# Patient Record
Sex: Female | Born: 1945
Health system: Southern US, Community
[De-identification: ages and names within clinical notes are randomized; demographics above are authoritative.]

## PROBLEM LIST (undated history)

## (undated) DIAGNOSIS — M1990 Unspecified osteoarthritis, unspecified site: Secondary | ICD-10-CM

## (undated) DIAGNOSIS — M5136 Other intervertebral disc degeneration, lumbar region: Secondary | ICD-10-CM

## (undated) DIAGNOSIS — J449 Chronic obstructive pulmonary disease, unspecified: Secondary | ICD-10-CM

## (undated) DIAGNOSIS — R0600 Dyspnea, unspecified: Secondary | ICD-10-CM

## (undated) DIAGNOSIS — M51369 Other intervertebral disc degeneration, lumbar region without mention of lumbar back pain or lower extremity pain: Secondary | ICD-10-CM

## (undated) DIAGNOSIS — J189 Pneumonia, unspecified organism: Secondary | ICD-10-CM

## (undated) DIAGNOSIS — E7800 Pure hypercholesterolemia, unspecified: Secondary | ICD-10-CM

## (undated) DIAGNOSIS — Z9981 Dependence on supplemental oxygen: Secondary | ICD-10-CM

## (undated) DIAGNOSIS — R002 Palpitations: Secondary | ICD-10-CM

## (undated) DIAGNOSIS — Z8709 Personal history of other diseases of the respiratory system: Secondary | ICD-10-CM

## (undated) HISTORY — DX: Palpitations: R00.2

## (undated) HISTORY — DX: Dyspnea, unspecified: R06.00

## (undated) HISTORY — DX: Chronic obstructive pulmonary disease, unspecified: J44.9

## (undated) HISTORY — PX: DILATION AND CURETTAGE OF UTERUS: SHX78

## (undated) HISTORY — PX: CARPAL TUNNEL RELEASE: SHX101

## (undated) HISTORY — DX: Pure hypercholesterolemia, unspecified: E78.00

## (undated) MED FILL — Dexamethasone Sodium Phosphate Inj 100 MG/10ML: INTRAMUSCULAR | Qty: 1 | Status: AC

---

## 1999-01-22 ENCOUNTER — Other Ambulatory Visit: Admission: RE | Admit: 1999-01-22 | Discharge: 1999-01-22 | Payer: Self-pay | Admitting: Obstetrics and Gynecology

## 1999-02-11 ENCOUNTER — Encounter: Payer: Self-pay | Admitting: Obstetrics and Gynecology

## 1999-02-11 ENCOUNTER — Encounter: Admission: RE | Admit: 1999-02-11 | Discharge: 1999-02-11 | Payer: Self-pay | Admitting: Obstetrics and Gynecology

## 2000-01-26 ENCOUNTER — Other Ambulatory Visit: Admission: RE | Admit: 2000-01-26 | Discharge: 2000-01-26 | Payer: Self-pay | Admitting: Obstetrics and Gynecology

## 2001-01-10 ENCOUNTER — Other Ambulatory Visit: Admission: RE | Admit: 2001-01-10 | Discharge: 2001-01-10 | Payer: Self-pay | Admitting: Obstetrics and Gynecology

## 2002-01-18 ENCOUNTER — Other Ambulatory Visit: Admission: RE | Admit: 2002-01-18 | Discharge: 2002-01-18 | Payer: Self-pay | Admitting: Gynecology

## 2002-03-01 ENCOUNTER — Ambulatory Visit (HOSPITAL_BASED_OUTPATIENT_CLINIC_OR_DEPARTMENT_OTHER): Admission: RE | Admit: 2002-03-01 | Discharge: 2002-03-01 | Payer: Self-pay | Admitting: Gynecology

## 2002-03-01 ENCOUNTER — Encounter (INDEPENDENT_AMBULATORY_CARE_PROVIDER_SITE_OTHER): Payer: Self-pay | Admitting: Specialist

## 2002-10-11 ENCOUNTER — Emergency Department (HOSPITAL_COMMUNITY): Admission: EM | Admit: 2002-10-11 | Discharge: 2002-10-12 | Payer: Self-pay | Admitting: Emergency Medicine

## 2003-01-31 ENCOUNTER — Other Ambulatory Visit: Admission: RE | Admit: 2003-01-31 | Discharge: 2003-01-31 | Payer: Self-pay | Admitting: Gynecology

## 2003-07-19 ENCOUNTER — Encounter: Admission: RE | Admit: 2003-07-19 | Discharge: 2003-07-19 | Payer: Self-pay | Admitting: Family Medicine

## 2003-07-25 ENCOUNTER — Encounter: Admission: RE | Admit: 2003-07-25 | Discharge: 2003-07-25 | Payer: Self-pay | Admitting: Family Medicine

## 2004-02-05 ENCOUNTER — Other Ambulatory Visit: Admission: RE | Admit: 2004-02-05 | Discharge: 2004-02-05 | Payer: Self-pay | Admitting: Gynecology

## 2005-02-09 ENCOUNTER — Other Ambulatory Visit: Admission: RE | Admit: 2005-02-09 | Discharge: 2005-02-09 | Payer: Self-pay | Admitting: Gynecology

## 2005-04-08 ENCOUNTER — Encounter: Admission: RE | Admit: 2005-04-08 | Discharge: 2005-04-08 | Payer: Self-pay | Admitting: Family Medicine

## 2005-07-27 ENCOUNTER — Other Ambulatory Visit: Admission: RE | Admit: 2005-07-27 | Discharge: 2005-07-27 | Payer: Self-pay | Admitting: Gynecology

## 2005-11-16 HISTORY — PX: CARDIOVASCULAR STRESS TEST: SHX262

## 2005-11-19 ENCOUNTER — Encounter: Payer: Self-pay | Admitting: Internal Medicine

## 2005-11-19 HISTORY — PX: US ECHOCARDIOGRAPHY: HXRAD669

## 2006-02-08 ENCOUNTER — Other Ambulatory Visit: Admission: RE | Admit: 2006-02-08 | Discharge: 2006-02-08 | Payer: Self-pay | Admitting: Gynecology

## 2006-07-05 ENCOUNTER — Other Ambulatory Visit: Admission: RE | Admit: 2006-07-05 | Discharge: 2006-07-05 | Payer: Self-pay | Admitting: Gynecology

## 2007-05-20 ENCOUNTER — Encounter: Admission: RE | Admit: 2007-05-20 | Discharge: 2007-05-20 | Payer: Self-pay | Admitting: Family Medicine

## 2009-04-18 ENCOUNTER — Encounter: Admission: RE | Admit: 2009-04-18 | Discharge: 2009-04-18 | Payer: Self-pay | Admitting: Gynecology

## 2009-07-17 ENCOUNTER — Encounter: Admission: RE | Admit: 2009-07-17 | Discharge: 2009-07-17 | Payer: Self-pay | Admitting: Gynecology

## 2009-11-27 ENCOUNTER — Ambulatory Visit: Payer: Self-pay | Admitting: Cardiovascular Disease

## 2009-11-29 ENCOUNTER — Ambulatory Visit (HOSPITAL_COMMUNITY): Admission: RE | Admit: 2009-11-29 | Discharge: 2009-11-29 | Payer: Self-pay | Admitting: Cardiovascular Disease

## 2009-11-29 ENCOUNTER — Encounter: Payer: Self-pay | Admitting: Internal Medicine

## 2009-12-13 ENCOUNTER — Ambulatory Visit: Payer: Self-pay | Admitting: Internal Medicine

## 2009-12-13 ENCOUNTER — Telehealth (INDEPENDENT_AMBULATORY_CARE_PROVIDER_SITE_OTHER): Payer: Self-pay | Admitting: *Deleted

## 2009-12-13 DIAGNOSIS — E7800 Pure hypercholesterolemia, unspecified: Secondary | ICD-10-CM | POA: Insufficient documentation

## 2009-12-13 DIAGNOSIS — R002 Palpitations: Secondary | ICD-10-CM | POA: Insufficient documentation

## 2009-12-13 DIAGNOSIS — J449 Chronic obstructive pulmonary disease, unspecified: Secondary | ICD-10-CM | POA: Insufficient documentation

## 2009-12-13 DIAGNOSIS — R0602 Shortness of breath: Secondary | ICD-10-CM | POA: Insufficient documentation

## 2010-01-08 ENCOUNTER — Telehealth (INDEPENDENT_AMBULATORY_CARE_PROVIDER_SITE_OTHER): Payer: Self-pay | Admitting: *Deleted

## 2010-02-11 NOTE — Assessment & Plan Note (Signed)
Summary: Pulmonary/ new pt eval for GOLD IV COPD    Visit Type:  Initial Consult Copy to:  Dr. Elease Hashimoto Primary Provider/Referring Provider:  Dr. Shaune Pollack  CC:  Dyspnea- Abnormal PFT's.  .  History of Present Illness: 65 yowf quit smoking 2007 tendency to bronchitis which resolved p quit and nl activity tolerance  with weight gain from 117 to  147 since and noted onset with diffulty with steps at our house proportionaely.  December 13, 2009  1st pulmonary office eval for doe x steps/ inclines,  no assoc cough, no nocturnal complaints. can shag dance fine.  Pt denies any significant sore throat, dysphagia, itching, sneezing,  nasal congestion or excess secretions,  fever, chills, sweats, unintended wt loss, pleuritic or exertional cp, hempoptysis, change in activity tolerance  orthopnea pnd or leg swelling Pt denies any significant sore throat, dysphagia, itching, sneezing,  nasal congestion or excess secretions,  fever, chills, sweats, unintended wt loss, pleuritic or exertional cp, hempoptysis, change in activity tolerance  orthopnea pnd or leg swelling   Current Medications (verified): 1)  Propranolol Hcl 80 Mg Tabs (Propranolol Hcl) .... 1/2 Once Daily 2)  Niaspan 500 Mg Cr-Tabs (Niacin (Antihyperlipidemic)) .Marland Kitchen.. 1 Once Daily 3)  Valacyclovir Hcl 1 Gm Tabs (Valacyclovir Hcl) .... 1/2 Once Daily 4)  Prometrium 200 Mg Caps (Progesterone Micronized) .... As Directed 5)  Vivelle-Dot 0.05 Mg/24hr Pttw (Estradiol) .... 1/2 Every Wk 6)  Aspirin 81 Mg Tbec (Aspirin) .Marland Kitchen.. 1 Once Daily 7)  Vitamin D3 1000 Unit Tabs (Cholecalciferol) .... 2 Two Times A Day 8)  Ocuvite  Tabs (Multiple Vitamins-Minerals) .Marland Kitchen.. 1 Once Daily 9)  Fish Oil 1000 Mg Caps (Omega-3 Fatty Acids) .Marland Kitchen.. 1 Two Times A Day 10)  Citracal Plus  Tabs (Multiple Minerals-Vitamins) .... 2 Two Times A Day 11)  Minocycline Hcl 50 Mg Caps (Minocycline Hcl) .Marland Kitchen.. 1 Once Daily Until Gone  Allergies (verified): 1)  ! Codeine  Past  History:  Past Medical History:  HYPERCHOLESTEROLEMIA (ICD-272.0) DYSPNEA (ICD-786.05) COPD     - HC03 level 43 05/2009      - PFT's 11/29/09 FEV1 .77 (29%) with ratio 46   and 12% p B2 and DLCO 57  Family History: Breast CA- Mother Emphysema- MGF  Social History: Widowed Children Former smoker.  Quit in May 2007.  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  The patient denies shortness of breath at rest, productive cough, non-productive cough, coughing up blood, chest pain, irregular heartbeats, acid heartburn, indigestion, loss of appetite, weight change, abdominal pain, difficulty swallowing, sore throat, tooth/dental problems, headaches, nasal congestion/difficulty breathing through nose, sneezing, itching, ear ache, anxiety, depression, hand/feet swelling, joint stiffness or pain, rash, change in color of mucus, and fever.    Vital Signs:  Patient profile:   65 year old female Height:      66 inches Weight:      147.25 pounds BMI:     23.85 O2 Sat:      93 % on Room air Temp:     97.5 degrees F oral Pulse rate:   63 / minute BP sitting:   114 / 74  (left arm)  Vitals Entered By: Vernie Murders (December 13, 2009 11:09 AM)  O2 Flow:  Room air  Physical Exam  Additional Exam:  wt 147 December 13, 2009  HEENT mild turbinate edema.  Oropharynx no thrush or excess pnd or cobblestoning.  No JVD or cervical adenopathy. Mild accessory muscle hypertrophy. Trachea midline, nl thryroid. Chest was hyperinflated by percussion with diminished breath sounds and moderate increased exp time without wheeze. Hoover sign positive at mid inspiration. Regular rate and rhythm without murmur gallop or rub or increase P2 or edema.  Abd: no hsm, nl excursion. Ext warm without cyanosis or clubbing.     Impression & Recommendations:  Problem # 1:  COPD UNSPECIFIED (ICD-496)  Much more  severe than symptoms suggest with sign reversible component, no cough or significant clinical variability    DDX of  difficult airways managment all start with A and  include Adherence, Ace Inhibitors, Acid Reflux, Active Sinus Disease, Alpha 1 Antitripsin deficiency, Anxiety masquerading as Airways dz,  ABPA,  allergy(esp in young), Aspiration (esp in elderly), Adverse effects of DPI,  Active smokers, plus one B  = Beta blocker use..  and one C = CHF  Beta blockers problematic here, see #2.   Would prefer to avoid rx for copd  for now until we see the full effect of change to specific Beta Blocker if tolerated and get a firm baseline.  Based on her hx, she would be a good candidate for spiriva and this actually can help even in presence of ongoing BBlockade affecting B2 receptors  Problem # 2:  PALPITATIONS (ICD-785.1)  Her updated medication list for this problem includes:    Propranolol Hcl 80 Mg Tabs (Propranolol hcl) .Marland Kitchen... 1/2 once daily    Bisoprolol Fumarate 5 Mg Tabs (Bisoprolol fumarate) ..... One each every evening  Medications Added to Medication List This Visit: 1)  Propranolol Hcl 80 Mg Tabs (Propranolol hcl) .... 1/2 once daily 2)  Niaspan 500 Mg Cr-tabs (Niacin (antihyperlipidemic)) .Marland Kitchen.. 1 once daily 3)  Valacyclovir Hcl 1 Gm Tabs (Valacyclovir hcl) .... 1/2 once daily 4)  Prometrium 200 Mg Caps (Progesterone micronized) .... As directed 5)  Vivelle-dot 0.05 Mg/24hr Pttw (Estradiol) .... 1/2 every wk 6)  Aspirin 81 Mg Tbec (Aspirin) .Marland Kitchen.. 1 once daily 7)  Vitamin D3 1000 Unit Tabs (Cholecalciferol) .... 2 two times a day 8)  Ocuvite Tabs (Multiple vitamins-minerals) .Marland Kitchen.. 1 once daily 9)  Fish Oil 1000 Mg Caps (Omega-3 fatty acids) .Marland Kitchen.. 1 two times a day 10)  Citracal Plus Tabs (Multiple minerals-vitamins) .... 2 two times a day 11)  Minocycline Hcl 50 Mg Caps (Minocycline hcl) .Marland Kitchen.. 1 once daily until gone 12)  Bisoprolol Fumarate 5 Mg Tabs (Bisoprolol fumarate) .... One each  every evening  Other Orders: New Patient Level V (04540) T-2 View CXR (71020TC)  Patient Instructions: 1)  Bisoprolol 5 mg one at bedtime 2)  Propranolol 80 mg one quarter in am for one week then try to stop it. 3)  Return to office in 3 months, sooner if needed   Appended Document: Pulmonary/ new pt eval for GOLD IV COPD  cxr  12/13/09:  Findings: Hyperexpansion is consistent with emphysema. The cardiopericardial silhouette is within normal limits for size.  No focal consolidation, pulmonary edema, or pleural effusion. Imaged bony structures of the thorax are intact.   IMPRESSION: No acute cardiopulmonary process.

## 2010-02-11 NOTE — Progress Notes (Signed)
Summary: needs bisoprolol rx called in asap today  Phone Note Call from Patient   Caller: Patient Call For: Joanne Rogers Summary of Call: pt was seen today. wants her rx' called in asap today to [facility] on [address] # [phone]. pt needed to take this tonight and is going out of town shortly. cell [phone] Initial call taken by: Tivis Ringer, CNA,  December 13, 2009 4:44 PM  Follow-up for Phone Call        Pt states MW started her on bisoprolol today at [facility] but rx was never sent in.  Rx was sent -- pt aware.  Follow-up by: Gweneth Dimitri RN,  December 13, 2009 5:00 PM    Prescriptions: BISOPROLOL FUMARATE 5 MG TABS (BISOPROLOL FUMARATE) one each every evening  #30 x 0   Entered by:   Gweneth Dimitri RN   Authorized by:   Nyoka Cowden MD   Signed by:   Gweneth Dimitri RN on 12/13/2009   Method used:   Electronically to        [facility] [address] (retail)       [phone]       [place], [place]  [address]       Ph: [phone]       Fax: [phone]   RxID:   [id number]

## 2010-02-13 NOTE — Progress Notes (Signed)
Summary: brioprolol   Phone Note Call from Patient Call back at Home Phone [phone]   Caller: Patient Call For: wert Reason for Call: Refill Medication Summary of Call: Patient calling wanting to know if she needs to continue  Bisoprolol 5 mg, if so she needs a refill.  [facility] [address] Initial call taken by: Lehman Prom,  January 08, 2010 3:00 PM  Follow-up for Phone Call        Spoke with pt and notified that rx was sent to pharm. Follow-up by: Vernie Murders,  January 08, 2010 3:08 PM    Prescriptions: BISOPROLOL FUMARATE 5 MG TABS (BISOPROLOL FUMARATE) one each every evening  #30 x 2   Entered by:   Vernie Murders   Authorized by:   Nyoka Cowden MD   Signed by:   Vernie Murders on 01/08/2010   Method used:   Electronically to        [facility] [address] (retail)       [phone]       [place], [place]  [address]       Ph: [phone]       Fax: [phone]   RxID:   [id number]

## 2010-03-31 ENCOUNTER — Encounter: Payer: Self-pay | Admitting: Internal Medicine

## 2010-03-31 ENCOUNTER — Ambulatory Visit (INDEPENDENT_AMBULATORY_CARE_PROVIDER_SITE_OTHER): Payer: Medicare Other | Admitting: Internal Medicine

## 2010-03-31 DIAGNOSIS — J449 Chronic obstructive pulmonary disease, unspecified: Secondary | ICD-10-CM

## 2010-03-31 DIAGNOSIS — R002 Palpitations: Secondary | ICD-10-CM

## 2010-04-10 NOTE — Assessment & Plan Note (Signed)
Summary: Pulmonary/ ext ov wih dpi 90%   Visit Type:  Follow-up Copy to:  Dr. Elease Hashimoto Primary Provider/Referring Provider:  Dr. Shaune Pollack  CC:  Patient is here for COPD follow-up...still having SOB w/ activity...no worse.  History of Present Illness: 70 yowf quit smoking 2007 tendency to bronchitis which resolved p quit and nl activity tolerance  with weight gain from 117 to  147 since and noted onset with difficulty  with steps at  house proportionaely.  December 13, 2009  1st pulmonary office eval for 3 years doe x steps/ inclines,  no assoc cough, no nocturnal complaints. can shag dance fine. PFT's 11/11 c/w Stage IV rec try off propranolol.  rec Bisoprolol 5 mg one at bedtime Propranolol 80 mg one quarter in am for one week then try to stop it.  March 31, 2010 ov Patient is here for COPD follow-up...still having SOB w/ activity...no worse. having active itching sneezing runny nose but no effect on breathing.  Pt denies any significant sore throat, dysphagia,  fever, chills, sweats, unintended wt loss, pleuritic or exertional cp, hempoptysis, change in activity tolerance  orthopnea pnd or leg swelling.  Pt also denies any obvious fluctuation in symptoms with weather or environmental change or other alleviating or aggravating factors.       Current Medications (verified): 1)  Niaspan 500 Mg Cr-Tabs (Niacin (Antihyperlipidemic)) .Marland Kitchen.. 1 Once Daily 2)  Valacyclovir Hcl 1 Gm Tabs (Valacyclovir Hcl) .... 1/2 Once Daily 3)  Prometrium 200 Mg Caps (Progesterone Micronized) .... As Directed 4)  Vivelle-Dot 0.05 Mg/24hr Pttw (Estradiol) .... 1/2 Every Wk 5)  Aspirin 81 Mg Tbec (Aspirin) .Marland Kitchen.. 1 Once Daily 6)  Vitamin D3 1000 Unit Tabs (Cholecalciferol) .... 2 Two Times A Day 7)  Ocuvite  Tabs (Multiple Vitamins-Minerals) .Marland Kitchen.. 1 Once Daily 8)  Fish Oil 1000 Mg Caps (Omega-3 Fatty Acids) .Marland Kitchen.. 1 Two Times A Day 9)  Citracal Plus  Tabs (Multiple Minerals-Vitamins) .... 2 Two Times A Day 10)   Minocycline Hcl 50 Mg Caps (Minocycline Hcl) .Marland Kitchen.. 1 Once Daily Until Gone 11)  Bisoprolol Fumarate 5 Mg Tabs (Bisoprolol Fumarate) .... One Each Every Evening  Allergies (verified): 1)  ! Codeine  Past History:  Past Medical History: HYPERCHOLESTEROLEMIA (ICD-272.0) DYSPNEA (ICD-786.05) COPD     - HC03 level 43 05/2009      - PFT's 11/29/09 FEV1 .77 (29%) with ratio 46   and 12% p B2 and DLCO 57     - Spriva trial March 31, 2010 >>>  Vital Signs:  Patient profile:   65 year old female Height:      66 inches (167.64 cm) Weight:      150.50 pounds (68.41 kg) BMI:     24.38 O2 Sat:      92 % on Room air Temp:     97.5 degrees F (36.39 degrees C) oral Pulse rate:   58 / minute BP sitting:   118 / 76  (left arm) Cuff size:   regular  Vitals Entered By: Michel Bickers CMA (March 31, 2010 11:42 AM)  O2 Sat at Rest %:  92 O2 Flow:  Room air CC: Patient is here for COPD follow-up...still having SOB w/ activity...no worse Comments Medications reviewed with patient Michel Bickers [facility]  March 31, 2010 11:43 AM   Physical Exam  Additional Exam:  wt 147 December 13, 2009 > 150 March 31, 2010  HEENT mild turbinate edema.  Oropharynx no thrush or excess pnd or  cobblestoning.  No JVD or cervical adenopathy. Mild accessory muscle hypertrophy. Trachea midline, nl thryroid. Chest was hyperinflated by percussion with diminished breath sounds and moderate increased exp time without wheeze. Hoover sign positive at mid inspiration. Regular rate and rhythm without murmur gallop or rub or increase P2 or edema.  Abd: no hsm, nl excursion. Ext warm without cyanosis or clubbing.     Impression & Recommendations:  Problem # 1:  COPD UNSPECIFIED (ICD-496)  Much more severe than symptoms suggest with sign reversible component, no cough or significant clinical variability    DDX of  difficult airways managment all start with A and  include Adherence, Ace Inhibitors, Acid Reflux, Active Sinus Disease, Alpha  1 Antitripsin deficiency, Anxiety masquerading as Airways dz,  ABPA,  allergy(esp in young), Aspiration (esp in elderly), Adverse effects of DPI,  Active smokers, plus one B  = Beta blocker use..  and one C = CHF  Beta blockers problematic here, see #2.   Try spiriva next I spent extra time with the patient today explaining optimal dpi  technique.  This improved from  75-90%  Problem # 2:  PALPITATIONS (ICD-785.1)  The following medications were removed from the medication list:    Propranolol Hcl 80 Mg Tabs (Propranolol hcl) .Marland Kitchen... 1/2 once daily Her updated medication list for this problem includes:    Bisoprolol Fumarate 5 Mg Tabs (Bisoprolol fumarate) ..... One each am   ok to use as needed propranolol but based on airflow obstruction prefer as maint rx  Bystolic, the most beta -1  selective Beta blocker available in sample form, with bisoprolol the most selective generic choice  on the market.   Medications Added to Medication List This Visit: 1)  Bisoprolol Fumarate 5 Mg Tabs (Bisoprolol fumarate) .... One each am 2)  Spiriva Handihaler 18 Mcg Caps (Tiotropium bromide monohydrate) .... Two puffs in handihaler daily  Other Orders: Est. Patient Level IV (44034) Prescription Created Electronically [phone])  Patient Instructions: 1)  Spiriva one capsule each am 2)  I think of spiriva in this setting like purchasing high octane fuel for an older car with lots of miles on it. It may help the perfomance enough to warrant the purchase, but it won't change the longevity of the car or make it any easier parking it. It should improve peak performance if the patient is patient and lets the medicine work the way it's intended  - improving activity tolerance 3)  Return to office in 3 months, sooner if needed  Prescriptions: SPIRIVA HANDIHALER 18 MCG  CAPS (TIOTROPIUM BROMIDE MONOHYDRATE) Two puffs in handihaler daily  #30 x 11   Entered and Authorized by:   Nyoka Cowden MD   Signed by:    Nyoka Cowden MD on 03/31/2010   Method used:   Print then Give to Patient   RxID:   [id number] BISOPROLOL FUMARATE 5 MG TABS (BISOPROLOL FUMARATE) one each am  #30 x 11   Entered and Authorized by:   Nyoka Cowden MD   Signed by:   Nyoka Cowden MD on 03/31/2010   Method used:   Electronically to        [facility] (retail)       [address]       [place], [place]  [address]       Ph: [phone]       Fax: [phone]   RxID:   [id number]  Immunization History:  Influenza Immunization History:    Influenza:  historical (10/12/2009)

## 2010-04-21 ENCOUNTER — Other Ambulatory Visit: Payer: Self-pay | Admitting: Gynecology

## 2010-05-30 NOTE — Op Note (Signed)
   NAME:  Joanne Rogers, Joanne Rogers                          ACCOUNT NO.:  [id number]   MEDICAL RECORD NO.:  [id number]                   PATIENT TYPE:  AMB   LOCATION:  [facility]                                 FACILITY:  [facility]   PHYSICIAN:  Gretta Cool, M.D.              DATE OF BIRTH:  03/09/1945   DATE OF PROCEDURE:  03/01/2002  DATE OF DISCHARGE:                                 OPERATIVE REPORT   PREOPERATIVE DIAGNOSIS:  Endometrial polyp versus fibroid with abnormal  uterine bleeding.   POSTOPERATIVE DIAGNOSIS:  Luminal leiomyoma approximately 2-3 cm in diameter  with abnormal uterine bleeding secondary.   ANESTHESIA:  IV sedation and paracervical block.   SURGEON:  Gretta Cool, M.D.   DESCRIPTION OF PROCEDURE:  Under excellent anesthesia as above with the  patient prepped and draped in Allen stirrups, the cervix was grasped with a  single-tooth tenaculum and pulled down into view.  It was then progressively  dilated with a series of Pratt dilators to accommodate 7 mm resectoscope.  Photographs were taken of the luminal fibroid.  The fibroid was then  progressively resected intracapsular.  As it was resected, it progressively  bulged further out into the endometrial cavity.  It appeared to be at least  2-3 cm in diameter with a very large volume of fibroid resected.  The  resection remained intracapsular and there was no significant bleeding as  the entire fibroid was shelled out.  The endometrial cavity was then  progressively resected so as to remove all of the endometrial tissue.  There  were some areas of intrauterine synechia that were also excised.  Tissue  submitted to pathology.  With reduced pressure there was no significant  bleeding.  The entire cavity was treated with VaporTrode electrode at 200  watts per cut after resection of the endometrium.  At the end of the  procedure, there was approximately a 30 cubic centimeters deficit.  No  complications.  The patient  returned to the recovery room in excellent  condition.                                               Gretta Cool, M.D.    CWL/MEDQ  D:  03/01/2002  T:  03/01/2002  Job:  [id number]   cc:   Duncan Dull, M.D.  [address]  [place]  [place] [address]  Fax: [phone]

## 2010-06-24 ENCOUNTER — Other Ambulatory Visit: Payer: Self-pay | Admitting: Gynecology

## 2010-06-24 DIAGNOSIS — Z1231 Encounter for screening mammogram for malignant neoplasm of breast: Secondary | ICD-10-CM

## 2010-06-28 ENCOUNTER — Encounter: Payer: Self-pay | Admitting: Internal Medicine

## 2010-07-03 ENCOUNTER — Ambulatory Visit (INDEPENDENT_AMBULATORY_CARE_PROVIDER_SITE_OTHER): Payer: Medicare Other | Admitting: Internal Medicine

## 2010-07-03 VITALS — BP 110/58 | HR 68 | Temp 97.6°F | Ht 67.0 in | Wt 150.0 lb

## 2010-07-03 DIAGNOSIS — R002 Palpitations: Secondary | ICD-10-CM

## 2010-07-03 DIAGNOSIS — J449 Chronic obstructive pulmonary disease, unspecified: Secondary | ICD-10-CM

## 2010-07-03 MED ORDER — PNEUMOCOCCAL VAC POLYVALENT 25 MCG/0.5ML IJ INJ
0.5000 mL | INJECTION | Freq: Once | INTRAMUSCULAR | Status: AC
  Administered 2010-07-03: 0.5 mL via INTRAMUSCULAR

## 2010-07-03 NOTE — Patient Instructions (Addendum)
Pneumovax given today is the last one you'll need the CDC changes guidelines  Although you have significant COPD from smoking, now that you've successfully quit you may enjoy many years of stability in terms of your lung function  Continue spiriva indefinitely   If you are satisfied with your treatment plan let your doctor know and he/she can either refill your medications or you can return here when your prescription runs out.     If in any way you are not 100% satisfied,  please tell us.  If 100% better, tell your friends!

## 2010-07-03 NOTE — Progress Notes (Signed)
Subjective:     Patient ID: Joanne Rogers, female   DOB: 1945-04-18, 65 y.o.   MRN: [id number]  HPI  Primary Lupita Leash Gates/Lomax   65 yowf quit smoking 2007 tendency to bronchitis which resolved p quit and nl activity tolerance with weight gain from 117 to 147 since then  noted onset with difficulty with steps at house proportionate to wt gain.  December 13, 2009 1st pulmonary office eval for 3 years doe x steps/ inclines, no assoc cough, no nocturnal complaints. can shag dance fine. PFT's 11/11 c/w GOLD  IV rec try off propranolol. rec Bisoprolol 5 mg one at bedtime  Propranolol 80 mg one quarter in am for one week then try to stop it.   March 31, 2010 ov COPD follow-up...still having SOB w/ activity...no worse. having active itching sneezing runny nose but no effect on breathing.  rec trial of spiriva  07/03/2010 ov/Lashun Ramseyer f/u doe def better on spiriva, not limited unless gets in a hurry or up steps.    Sleeping ok without nocturnal  or early am exac of resp c/o's or need for noct saba. Pt denies any significant sore throat, dysphagia, itching, sneezing,  nasal congestion or excess/ purulent secretions,  fever, chills, sweats, unintended wt loss, pleuritic or exertional cp, hempoptysis, orthopnea pnd or leg swelling.    Also denies any obvious fluctuation of symptoms with weather or environmental changes or other aggravating or alleviating factors.      Past Medical History:  HYPERCHOLESTEROLEMIA (ICD-272.0)  DYSPNEA (ICD-786.05)  COPD  - HC03 level 43 05/2009  - PFT's 11/29/09 FEV1 .77 (29%) with ratio 46 and 12% p B2 and DLCO 57  - Spriva trial March 31, 2010 > improved ex tol               Review of Systems     Objective:   Physical Exam    Additional Exam: wt 147 December 13, 2009 > 150 March 31, 2010 > 150 07/03/10 HEENT mild turbinate edema. Oropharynx no thrush or excess pnd or cobblestoning. No JVD or cervical adenopathy. Mild accessory muscle hypertrophy. Trachea midline,  nl thryroid. Chest was hyperinflated by percussion with diminished breath sounds and moderate increased exp time without wheeze. Hoover sign positive at mid inspiration. Regular rate and rhythm without murmur gallop or rub or increase P2 or edema. Abd: no hsm, nl excursion. Ext warm without cyanosis or clubbing Assessment:          Plan:

## 2010-07-04 ENCOUNTER — Encounter: Payer: Self-pay | Admitting: Internal Medicine

## 2010-07-04 NOTE — Assessment & Plan Note (Signed)
Strongly prefer in this setting: Bystolic, the most beta -1  selective Beta blocker available in sample form, with bisoprolol the most selective generic choice  on the market.  

## 2010-07-04 NOTE — Assessment & Plan Note (Signed)
GOLD IV but remarkably well compensated with acceptable activity tolerance on spiriva.    Each maintenance medication was reviewed in detail including most importantly the difference between maintenance and as needed and under what circumstances the prns are to be used.  Please see instructions for details which were reviewed in writing and the patient given a copy.  See instructions for specific recommendations which were reviewed directly with the patient who was given a copy with highlighter outlining the key components.

## 2010-07-22 ENCOUNTER — Ambulatory Visit
Admission: RE | Admit: 2010-07-22 | Discharge: 2010-07-22 | Disposition: A | Discharge status: Home / Self Care | Payer: Medicare Other | Source: Ambulatory Visit | Attending: Gynecology | Admitting: Gynecology

## 2010-07-22 DIAGNOSIS — Z1231 Encounter for screening mammogram for malignant neoplasm of breast: Secondary | ICD-10-CM

## 2010-10-29 ENCOUNTER — Encounter: Payer: Self-pay | Admitting: Cardiovascular Disease

## 2010-11-19 ENCOUNTER — Encounter: Payer: Self-pay | Admitting: Cardiovascular Disease

## 2010-11-21 ENCOUNTER — Ambulatory Visit (INDEPENDENT_AMBULATORY_CARE_PROVIDER_SITE_OTHER): Payer: Medicare Other | Admitting: Cardiovascular Disease

## 2010-11-21 ENCOUNTER — Encounter: Payer: Self-pay | Admitting: Cardiovascular Disease

## 2010-11-21 VITALS — BP 106/63 | HR 62 | Ht 67.0 in | Wt 151.1 lb

## 2010-11-21 DIAGNOSIS — R002 Palpitations: Secondary | ICD-10-CM

## 2010-11-21 NOTE — Assessment & Plan Note (Signed)
She is doing well on the Bisoprolol. Continue.  She may take PRN pranolol.  She will get further refills from her primary medical doctor.

## 2010-11-21 NOTE — Progress Notes (Signed)
Joanne Rogers Date of Birth  November 10, 1945 [facility] [address]    [address] [place], [place]  [address] [phone]  Fax  [phone]  History of Present Illness:  Joanne Rogers is a 65 y.o. female with a hx of dyspnea.  She had a normal myoview in 2007.  She has a hx of hyperliidemia.   She had recent lipids but they are not available at this time.  She has been diagnosed with COPD.  She denies any chest pain.  Current Outpatient Prescriptions on File Prior to Visit  Medication Sig Dispense Refill  . aspirin 81 MG tablet Take 81 mg by mouth daily.        . bisoprolol (ZEBETA) 5 MG tablet Take 5 mg by mouth daily.        . Cholecalciferol (VITAMIN D) 1000 UNITS capsule Take 1,000 Units by mouth 2 (two) times daily.        . Estradiol (ELESTRIN) 0.52 MG/0.87 GM (0.06%) GEL Place 1 application onto the skin daily.        . Multiple Minerals-Vitamins (CITRACAL PLUS) TABS Take 2 tablets by mouth 2 (two) times daily.        . Multiple Vitamins-Minerals (OCUVITE PO) Take 1 capsule by mouth daily.        . niacin (NIASPAN) 500 MG CR tablet Take 1,000 mg by mouth at bedtime.       . Omega-3 Fatty Acids (FISH OIL) 1000 MG CAPS Take 2 capsules by mouth 2 (two) times daily.       . progesterone (PROMETRIUM) 200 MG capsule Take 200 mg by mouth as directed.        . tiotropium (SPIRIVA) 18 MCG inhalation capsule Place 18 mcg into inhaler and inhale daily.        . valACYclovir (VALTREX) 1000 MG tablet 1/2 daily        Allergies  Allergen Reactions  . Codeine     REACTION: hives  . Crestor (Rosuvastatin Calcium)   . Lovastatin   . Simvastatin     Past Medical History  Diagnosis Date  . Hypercholesterolemia   . Dyspnea   . COPD (chronic obstructive pulmonary disease)   . Palpitations   . Chest pain     Past Surgical History  Procedure Date  . Cardiovascular stress test 11/16/2005    EF 70%, NO ISCHEMIA  . US echocardiography 11/19/2005    EF 55-60%    History  Smoking status    . Former Smoker -- 0.5 packs/day for 25 years  . Quit date: 05/16/2005  Smokeless tobacco  . Not on file    History  Alcohol Use  . Yes    social    Family History  Problem Relation Age of Onset  . Breast cancer Mother   . Emphysema Maternal Grandfather     Reviw of Systems:  Reviewed in the HPI.  All other systems are negative.  Physical Exam: BP 106/63  Pulse 62  Ht 5\' 7"  (1.702 m)  Wt 151 lb 1.9 oz (68.548 kg)  BMI 23.67 kg/m2 The patient is alert and oriented x 3.  The mood and affect are normal.   Skin: warm and dry.  Color is normal.    HEENT:   Normal carotids, no JVD, mucus membranes are normal.  Lungs: clear to ausc.   Heart: RR    Abdomen: +BS, no HSM, non tender  Extremities:  On palpable cords, no c/c/e  Neuro:  Non focal,  gait is normal    ECG: NSR, normal ECG  Assessment / Plan:

## 2010-11-21 NOTE — Patient Instructions (Signed)
Your physician recommends that you schedule a follow-up appointment in: AS NEEDED BASIS  

## 2010-11-21 NOTE — Assessment & Plan Note (Signed)
Joanne Rogers does not have any significant cardiac problems. She has a history history of dyspnea which he now knows due to her COPD. We'll have her followup with Dr. Sherene Sires and I will see her as needed.

## 2010-12-03 ENCOUNTER — Telehealth: Payer: Self-pay | Admitting: Internal Medicine

## 2010-12-03 ENCOUNTER — Telehealth: Payer: Self-pay | Admitting: Cardiovascular Disease

## 2010-12-03 NOTE — Telephone Encounter (Signed)
Samples of Spiriva placed up front for pt to pick up. Pt told that the office will be closing early and she will need to come by 1pm if needing the samples today. [place] first of the year.

## 2010-12-03 NOTE — Telephone Encounter (Signed)
New message:  Would you ask Dr. Elease Hashimoto about her medication. Her muscles in arms are extremely sore.  Can this be coming from the medication she is taking?  Please call and advise.  Can she take Co-q-10?

## 2010-12-03 NOTE — Telephone Encounter (Signed)
Stop niaspan for one week to see if arm pain gets better. If not, she is to see pcp for recommendation. She is to call back in one week to update [place]. Pt verbalized understanding. Alfonso Ramus RN

## 2010-12-10 ENCOUNTER — Telehealth: Payer: Self-pay | Admitting: *Deleted

## 2010-12-10 NOTE — Telephone Encounter (Signed)
Left msg to call back with update of muscle ache and stopping of niaspan.

## 2010-12-11 NOTE — Telephone Encounter (Signed)
Fu call Pt returning your call from yesterday

## 2010-12-11 NOTE — Telephone Encounter (Signed)
Arm pains continue despite stopping niaspan. Pt will re start med. Pt to contact pcp for further work up with muscle ache in bilateral arms.

## 2011-01-14 ENCOUNTER — Other Ambulatory Visit: Payer: Self-pay | Admitting: Cardiovascular Disease

## 2011-01-14 NOTE — Telephone Encounter (Signed)
Fax Received. Refill Completed. Kya Mayfield Chowoe (R.M.A)   

## 2011-01-21 ENCOUNTER — Other Ambulatory Visit: Payer: Self-pay | Admitting: Dermatology

## 2011-04-06 ENCOUNTER — Other Ambulatory Visit: Payer: Self-pay | Admitting: Internal Medicine

## 2011-04-09 ENCOUNTER — Telehealth: Payer: Self-pay | Admitting: Cardiovascular Disease

## 2011-04-09 MED ORDER — BISOPROLOL FUMARATE 5 MG PO TABS: 5.0000 mg | ORAL_TABLET | Freq: Every day | ORAL | Status: DC

## 2011-04-09 NOTE — Telephone Encounter (Signed)
Pt uses [facility]/not [facility], script refilled

## 2011-04-09 NOTE — Telephone Encounter (Signed)
Pt calling re refilling Bisotrolol 5 mg #30 for her, dr wert prescribed it but she requested it three days ago, but he has not called it in yet and she's going out of town, wants to know if Dr Elease Hashimoto would refill it? uses  [facility],. Pt [phone]

## 2011-04-13 ENCOUNTER — Other Ambulatory Visit: Payer: Self-pay | Admitting: *Deleted

## 2011-04-13 NOTE — Telephone Encounter (Signed)
Opened in Error.

## 2011-04-23 ENCOUNTER — Other Ambulatory Visit: Payer: Self-pay | Admitting: Gynecology

## 2011-04-28 ENCOUNTER — Other Ambulatory Visit: Payer: Self-pay | Admitting: Internal Medicine

## 2011-05-28 ENCOUNTER — Encounter: Payer: Self-pay | Admitting: Cardiovascular Disease

## 2011-07-01 ENCOUNTER — Other Ambulatory Visit: Payer: Self-pay | Admitting: Gynecology

## 2011-07-01 DIAGNOSIS — Z803 Family history of malignant neoplasm of breast: Secondary | ICD-10-CM

## 2011-07-01 DIAGNOSIS — Z1231 Encounter for screening mammogram for malignant neoplasm of breast: Secondary | ICD-10-CM

## 2011-07-23 ENCOUNTER — Ambulatory Visit: Payer: Medicare Other

## 2011-07-31 ENCOUNTER — Telehealth: Payer: Self-pay | Admitting: Internal Medicine

## 2011-07-31 NOTE — Telephone Encounter (Signed)
Spoke with Marcelino Duster and advised we can not give samples b/c we only see the pt prn now and she has not been seen in over 1 yr. Advised her to check with pt's PCP and see if they can help. She verbalized understanding and states nothing further needed.

## 2011-08-06 ENCOUNTER — Ambulatory Visit
Admission: RE | Admit: 2011-08-06 | Discharge: 2011-08-06 | Disposition: A | Discharge status: Home / Self Care | Payer: Medicare Other | Source: Ambulatory Visit | Attending: Gynecology | Admitting: Gynecology

## 2011-08-06 DIAGNOSIS — Z803 Family history of malignant neoplasm of breast: Secondary | ICD-10-CM

## 2011-08-06 DIAGNOSIS — Z1231 Encounter for screening mammogram for malignant neoplasm of breast: Secondary | ICD-10-CM

## 2011-09-02 ENCOUNTER — Other Ambulatory Visit: Payer: Self-pay | Admitting: Cardiovascular Disease

## 2011-09-02 MED ORDER — NIACIN ER (ANTIHYPERLIPIDEMIC) 1000 MG PO TBCR: 1000.0000 mg | EXTENDED_RELEASE_TABLET | Freq: Every day | ORAL | Status: DC

## 2011-09-02 NOTE — Addendum Note (Signed)
Addended by: Linzie Collin D on: 09/02/2011 10:24 AM   Modules accepted: Orders

## 2011-09-03 ENCOUNTER — Telehealth: Payer: Self-pay | Admitting: Cardiovascular Disease

## 2011-09-03 NOTE — Telephone Encounter (Signed)
error 

## 2011-09-28 ENCOUNTER — Other Ambulatory Visit: Payer: Self-pay | Admitting: Cardiovascular Disease

## 2011-09-30 NOTE — Telephone Encounter (Signed)
Fax Received. Refill Completed. Cherre Kothari Chowoe (R.M.A)   

## 2011-11-23 ENCOUNTER — Encounter: Payer: Self-pay | Admitting: Cardiovascular Disease

## 2011-11-23 ENCOUNTER — Ambulatory Visit (INDEPENDENT_AMBULATORY_CARE_PROVIDER_SITE_OTHER): Payer: Medicare Other | Admitting: Cardiovascular Disease

## 2011-11-23 VITALS — BP 112/66 | HR 61 | Ht 66.5 in | Wt 146.0 lb

## 2011-11-23 DIAGNOSIS — E7800 Pure hypercholesterolemia, unspecified: Secondary | ICD-10-CM

## 2011-11-23 DIAGNOSIS — E785 Hyperlipidemia, unspecified: Secondary | ICD-10-CM

## 2011-11-23 DIAGNOSIS — R0609 Other forms of dyspnea: Secondary | ICD-10-CM

## 2011-11-23 DIAGNOSIS — R0989 Other specified symptoms and signs involving the circulatory and respiratory systems: Secondary | ICD-10-CM

## 2011-11-23 DIAGNOSIS — R0600 Dyspnea, unspecified: Secondary | ICD-10-CM

## 2011-11-23 DIAGNOSIS — R002 Palpitations: Secondary | ICD-10-CM

## 2011-11-23 MED ORDER — NIACIN ER (ANTIHYPERLIPIDEMIC) 1000 MG PO TBCR: 1000.0000 mg | EXTENDED_RELEASE_TABLET | Freq: Every day | ORAL | Status: DC

## 2011-11-23 NOTE — Patient Instructions (Addendum)
Your physician wants you to follow-up in: 1 year. You will receive a reminder letter in the mail two months in advance. If you don't receive a letter, please call our office to schedule the follow-up appointment.  

## 2011-11-23 NOTE — Assessment & Plan Note (Signed)
Continue current medications. I'll see her again in one year for office visit and fasting labs.

## 2011-11-23 NOTE — Progress Notes (Signed)
Joanne Rogers Date of Birth  Jun 08, 1945 [facility] [address]    [address] [place], [place]  [address] [phone]  Fax  [phone]  History of Present Illness:  Joanne Rogers is a 66 y.o. female with a hx of dyspnea.  She had a normal myoview in 2007.  She has a hx of hyperliidemia.   She had recent lipids but they are not available at this time.  She has been diagnosed with COPD.  She denies any chest pain.  She had some chest wall / breast pain during there last mamogram on July.  She is exercising 2-3 times a week.  No CP or dyspnea.   Current Outpatient Prescriptions on File Prior to Visit  Medication Sig Dispense Refill  . aspirin 81 MG tablet Take 81 mg by mouth daily.        . bisoprolol (ZEBETA) 5 MG tablet TAKE ONE TABLET BY MOUTH EVERY DAY  30 tablet  4  . Cholecalciferol (VITAMIN D) 1000 UNITS capsule Take 1,000 Units by mouth 2 (two) times daily.        . Estradiol (ELESTRIN) 0.52 MG/0.87 GM (0.06%) GEL Place 1 application onto the skin daily.        . Multiple Minerals-Vitamins (CITRACAL PLUS) TABS Take 2 tablets by mouth 2 (two) times daily.        . Multiple Vitamins-Minerals (OCUVITE PO) Take 1 capsule by mouth daily.        . niacin (NIASPAN) 1000 MG CR tablet Take 1 tablet (1,000 mg total) by mouth at bedtime.  30 tablet  4  . Omega-3 Fatty Acids (FISH OIL) 1000 MG CAPS Take 2 capsules by mouth 2 (two) times daily.       . progesterone (PROMETRIUM) 200 MG capsule Take 200 mg by mouth as directed.        Marland Kitchen SPIRIVA HANDIHALER 18 MCG inhalation capsule INHALE TWO DOSES EVERY DAY  30 each  4  . valACYclovir (VALTREX) 1000 MG tablet 1/2 daily        Allergies  Allergen Reactions  . Codeine     REACTION: hives  . Crestor (Rosuvastatin Calcium)   . Lovastatin   . Simvastatin     Past Medical History  Diagnosis Date  . Hypercholesterolemia   . Dyspnea   . COPD (chronic obstructive pulmonary disease)   . Palpitations   . Chest pain     Past Surgical  History  Procedure Date  . Cardiovascular stress test 11/16/2005    EF 70%, NO ISCHEMIA  . US echocardiography 11/19/2005    EF 55-60%    History  Smoking status  . Former Smoker -- 0.5 packs/day for 25 years  . Quit date: 05/16/2005  Smokeless tobacco  . Not on file    History  Alcohol Use  . Yes    Comment: social    Family History  Problem Relation Age of Onset  . Breast cancer Mother   . Emphysema Maternal Grandfather     Reviw of Systems:  Reviewed in the HPI.  All other systems are negative.  Physical Exam: BP 112/66  Pulse 61  Ht 5' 6.5" (1.689 m)  Wt 146 lb (66.225 kg)  BMI 23.21 kg/m2  SpO2 94% The patient is alert and oriented x 3.  The mood and affect are normal.   Skin: warm and dry.  Color is normal.    HEENT:   Normal carotids, no JVD, mucus membranes are normal.  Lungs:  clear to ausc.   Heart: RR    Abdomen: +BS, no HSM, non tender  Extremities:  On palpable cords, no c/c/e  Neuro:  Non focal, gait is normal    ECG: 11/23/2011-normal sinus rhythm at 61 beats a minute. No ST or T wave changes.  Assessment / Plan:

## 2011-12-04 ENCOUNTER — Other Ambulatory Visit: Payer: Self-pay | Admitting: Internal Medicine

## 2012-01-22 ENCOUNTER — Encounter: Payer: Self-pay | Admitting: Internal Medicine

## 2012-01-22 ENCOUNTER — Encounter: Payer: Self-pay | Admitting: Cardiovascular Disease

## 2012-01-25 ENCOUNTER — Ambulatory Visit: Payer: Medicare Other | Attending: Sports Medicine

## 2012-01-25 DIAGNOSIS — IMO0001 Reserved for inherently not codable concepts without codable children: Secondary | ICD-10-CM | POA: Insufficient documentation

## 2012-01-25 DIAGNOSIS — M542 Cervicalgia: Secondary | ICD-10-CM | POA: Insufficient documentation

## 2012-01-25 DIAGNOSIS — M25519 Pain in unspecified shoulder: Secondary | ICD-10-CM | POA: Insufficient documentation

## 2012-01-25 DIAGNOSIS — R5381 Other malaise: Secondary | ICD-10-CM | POA: Insufficient documentation

## 2012-02-08 ENCOUNTER — Ambulatory Visit: Payer: Medicare Other

## 2012-02-09 ENCOUNTER — Other Ambulatory Visit: Payer: Self-pay | Admitting: Dermatology

## 2012-03-03 ENCOUNTER — Other Ambulatory Visit: Payer: Self-pay | Admitting: *Deleted

## 2012-03-03 MED ORDER — BISOPROLOL FUMARATE 5 MG PO TABS: 5.0000 mg | ORAL_TABLET | Freq: Every day | ORAL | Status: DC

## 2012-03-03 NOTE — Telephone Encounter (Signed)
Fax Received. Refill Completed. Joanne Rogers (R.M.A)   

## 2012-06-23 ENCOUNTER — Encounter: Payer: Self-pay | Admitting: Cardiovascular Disease

## 2012-06-27 ENCOUNTER — Other Ambulatory Visit: Payer: Self-pay

## 2012-06-27 DIAGNOSIS — Z1231 Encounter for screening mammogram for malignant neoplasm of breast: Secondary | ICD-10-CM

## 2012-06-29 ENCOUNTER — Telehealth: Payer: Self-pay | Admitting: Cardiovascular Disease

## 2012-06-29 NOTE — Telephone Encounter (Signed)
PT AWARE DR OUT OF OFFICE AND WILL RECEIVE A RECOMMENDATION FOR ADJUSTMENTS TO HER MEDS. LABS FROM PCP ARE SCANNED IN.

## 2012-06-29 NOTE — Telephone Encounter (Signed)
New Problem  Pt states she went to see Dr Kevan Ny and was advised to call us because her cholesterol was high.  She said it was HDL was 56 and LDL 203, and her triglycerides was 105.   She wants to know what Dr Melburn Popper would like her to do.

## 2012-07-01 NOTE — Telephone Encounter (Signed)
LDL is 203.  She is intolerant to statins.  No further recs.

## 2012-07-04 NOTE — Telephone Encounter (Signed)
msg left for pt

## 2012-07-27 ENCOUNTER — Other Ambulatory Visit: Payer: Self-pay | Admitting: *Deleted

## 2012-07-27 MED ORDER — BISOPROLOL FUMARATE 5 MG PO TABS: 5.0000 mg | ORAL_TABLET | Freq: Every day | ORAL | Status: DC

## 2012-07-27 NOTE — Telephone Encounter (Signed)
Fax Received. Refill Completed. Dhrithi Riche Chowoe (R.M.A)   

## 2012-08-10 ENCOUNTER — Ambulatory Visit: Payer: Medicare Other

## 2012-09-20 ENCOUNTER — Ambulatory Visit
Admission: RE | Admit: 2012-09-20 | Discharge: 2012-09-20 | Disposition: A | Discharge status: Home / Self Care | Payer: Medicare Other | Source: Ambulatory Visit

## 2012-09-20 DIAGNOSIS — Z1231 Encounter for screening mammogram for malignant neoplasm of breast: Secondary | ICD-10-CM

## 2012-12-01 ENCOUNTER — Encounter: Payer: Self-pay | Admitting: Cardiovascular Disease

## 2012-12-13 ENCOUNTER — Ambulatory Visit (INDEPENDENT_AMBULATORY_CARE_PROVIDER_SITE_OTHER): Payer: Medicare Other | Admitting: Cardiovascular Disease

## 2012-12-13 ENCOUNTER — Encounter: Payer: Self-pay | Admitting: Cardiovascular Disease

## 2012-12-13 VITALS — BP 120/70 | HR 59 | Resp 12 | Ht 66.0 in | Wt 153.0 lb

## 2012-12-13 DIAGNOSIS — R002 Palpitations: Secondary | ICD-10-CM

## 2012-12-13 DIAGNOSIS — E7800 Pure hypercholesterolemia, unspecified: Secondary | ICD-10-CM

## 2012-12-13 LAB — LIPID PANEL
Cholesterol: 255 mg/dL — ABNORMAL HIGH (ref 0–200)
HDL: 49.5 mg/dL (ref >39.00)
Total CHOL/HDL Ratio: 5
Triglycerides: 178 mg/dL — ABNORMAL HIGH (ref 0.0–149.0)
VLDL: 35.6 mg/dL (ref 0.0–40.0)

## 2012-12-13 LAB — HEPATIC FUNCTION PANEL
ALT: 37 U/L — ABNORMAL HIGH (ref 0–35)
AST: 31 U/L (ref 0–37)
Albumin: 3.7 g/dL (ref 3.5–5.2)
Alkaline Phosphatase: 79 U/L (ref 39–117)
Bilirubin, Direct: 0 mg/dL (ref 0.0–0.3)
Total Bilirubin: 0.7 mg/dL (ref 0.3–1.2)
Total Protein: 7.2 g/dL (ref 6.0–8.3)

## 2012-12-13 LAB — BASIC METABOLIC PANEL
BUN: 11 mg/dL (ref 6–23)
CO2: 33 mEq/L — ABNORMAL HIGH (ref 19–32)
Calcium: 9.8 mg/dL (ref 8.4–10.5)
Chloride: 101 mEq/L (ref 96–112)
Creatinine, Ser: 0.6 mg/dL (ref 0.4–1.2)
GFR: 103.25 mL/min (ref >60.00)
Glucose, Bld: 101 mg/dL — ABNORMAL HIGH (ref 70–99)
Potassium: 4.7 mEq/L (ref 3.5–5.1)
Sodium: 139 mEq/L (ref 135–145)

## 2012-12-13 LAB — LDL CHOLESTEROL, DIRECT: Direct LDL: 180.1 mg/dL

## 2012-12-13 MED ORDER — BISOPROLOL FUMARATE 5 MG PO TABS: 5.0000 mg | ORAL_TABLET | Freq: Every day | ORAL | Status: DC

## 2012-12-13 NOTE — Assessment & Plan Note (Signed)
Check fasting labs today. I'll see her again in one year. We'll check fasting labs and at that time.

## 2012-12-13 NOTE — Patient Instructions (Signed)
Your physician recommends that you return for a FASTING lipid profile: today  Your physician wants you to follow-up in: 1 year You will receive a reminder letter in the mail two months in advance. If you don't receive a letter, please call our office to schedule the follow-up appointment.

## 2012-12-13 NOTE — Progress Notes (Signed)
Joanne Rogers Date of Birth  April 20, 1945  [facility] [address]    [address] [place], [place]  [address] [phone]  Fax  [phone]  Problem List 1. Hyperlipidemia 2. COPD   History of Present Illness:  Joanne Rogers is a 66 y.o. female with a hx of dyspnea.  She had a normal myoview in 2007.  She has a hx of hyperliidemia.   She had recent lipids but they are not available at this time.  She has been diagnosed with COPD.  She denies any chest pain.  She had some chest wall / breast pain during there last mamogram on July.  She is exercising 2-3 times a week.  No CP or dyspnea.  Dec. 2, 2014:   Joanne Rogers is doing well.  Riding bike and exercising regularly.   Looking for a new job.  No CP or palps  Current Outpatient Prescriptions on File Prior to Visit  Medication Sig Dispense Refill  . aspirin 81 MG tablet Take 81 mg by mouth daily.        . bisoprolol (ZEBETA) 5 MG tablet Take 1 tablet (5 mg total) by mouth daily.  30 tablet  4  . Cholecalciferol (VITAMIN D) 1000 UNITS capsule Take 1,000 Units by mouth 2 (two) times daily.        . Coconut Oil 1000 MG CAPS Take 4 capsules daily      . Estradiol (ELESTRIN) 0.52 MG/0.87 GM (0.06%) GEL Place 1 application onto the skin daily.        . Multiple Vitamins-Minerals (OCUVITE PO) Take 1 capsule by mouth daily.        . Omega-3 Fatty Acids (FISH OIL) 1000 MG CAPS Take 2 capsules by mouth 2 (two) times daily.       . progesterone (PROMETRIUM) 200 MG capsule Take 200 mg by mouth as directed.        . valACYclovir (VALTREX) 1000 MG tablet 1/2 daily      . SPIRIVA HANDIHALER 18 MCG inhalation capsule INHALE TWO DOSES EVERY DAY  30 each  4   No current facility-administered medications on file prior to visit.    Allergies  Allergen Reactions  . Codeine     REACTION: hives  . Crestor [Rosuvastatin Calcium]   . Lovastatin   . Simvastatin     Past Medical History  Diagnosis Date  . Hypercholesterolemia   . Dyspnea   . COPD  (chronic obstructive pulmonary disease)   . Palpitations   . Chest pain     Past Surgical History  Procedure Laterality Date  . Cardiovascular stress test  11/16/2005    EF 70%, NO ISCHEMIA  . US echocardiography  11/19/2005    EF 55-60%    History  Smoking status  . Former Smoker -- 0.50 packs/day for 25 years  . Quit date: 05/16/2005  Smokeless tobacco  . Not on file    History  Alcohol Use  . Yes    Comment: social    Family History  Problem Relation Age of Onset  . Breast cancer Mother   . Emphysema Maternal Grandfather     Reviw of Systems:  Reviewed in the HPI.  All other systems are negative.  Physical Exam: BP 120/70  Pulse 59  Resp 12  Ht 5\' 6"  (1.676 m)  Wt 153 lb (69.4 kg)  BMI 24.71 kg/m2 The patient is alert and oriented x 3.  The mood and affect are normal.   Skin: warm and  dry.  Color is normal.    HEENT:   Normal carotids, no JVD, mucus membranes are normal.  Lungs: clear to ausc.   Heart: RR    Abdomen: +BS, no HSM, non tender  Extremities:  On palpable cords, no c/c/e  Neuro:  Non focal, gait is normal    ECG: Dec. 2, 2014:  Sinus brady at 59.  Otherwise normal ECG.  Assessment / Plan:

## 2012-12-15 ENCOUNTER — Telehealth: Payer: Self-pay | Admitting: *Deleted

## 2012-12-15 DIAGNOSIS — E785 Hyperlipidemia, unspecified: Secondary | ICD-10-CM

## 2012-12-15 MED ORDER — EZETIMIBE 10 MG PO TABS: 10.0000 mg | ORAL_TABLET | Freq: Every day | ORAL | Status: DC

## 2012-12-15 NOTE — Telephone Encounter (Signed)
Message copied by Antony Odea on Thu Dec 15, 2012  3:26 PM ------      Message from: Vesta Mixer      Created: Tue Dec 13, 2012  6:10 PM       She doe not tolerate statins.   Try zetia 10 mg a day. Have her work on diet and exercise.             Recheck labs in 3 months. ------

## 2012-12-15 NOTE — Telephone Encounter (Signed)
zetia samples placed at desk/ pt aware.  Pt told to try to get samples till lab draw in 2 months/ date given. Pt agreed to plan.

## 2013-01-09 ENCOUNTER — Telehealth: Payer: Self-pay

## 2013-01-09 NOTE — Telephone Encounter (Signed)
Patient called for zetia samples put samples at front desk

## 2013-02-09 ENCOUNTER — Telehealth: Payer: Self-pay | Admitting: *Deleted

## 2013-02-09 NOTE — Telephone Encounter (Signed)
Provided 4 weeks samples ZETIA, requested patient fill out PAP through [employer].

## 2013-02-16 ENCOUNTER — Other Ambulatory Visit (INDEPENDENT_AMBULATORY_CARE_PROVIDER_SITE_OTHER): Payer: Medicare Other

## 2013-02-16 DIAGNOSIS — E785 Hyperlipidemia, unspecified: Secondary | ICD-10-CM

## 2013-02-16 LAB — HEPATIC FUNCTION PANEL
ALT: 37 U/L — ABNORMAL HIGH (ref 0–35)
AST: 28 U/L (ref 0–37)
Albumin: 3.5 g/dL (ref 3.5–5.2)
Alkaline Phosphatase: 81 U/L (ref 39–117)
Bilirubin, Direct: 0 mg/dL (ref 0.0–0.3)
Total Bilirubin: 0.7 mg/dL (ref 0.3–1.2)
Total Protein: 6.9 g/dL (ref 6.0–8.3)

## 2013-02-16 LAB — BASIC METABOLIC PANEL
BUN: 12 mg/dL (ref 6–23)
CO2: 33 mEq/L — ABNORMAL HIGH (ref 19–32)
Calcium: 9.5 mg/dL (ref 8.4–10.5)
Chloride: 103 mEq/L (ref 96–112)
Creatinine, Ser: 0.5 mg/dL (ref 0.4–1.2)
GFR: 121.37 mL/min (ref >60.00)
Glucose, Bld: 95 mg/dL (ref 70–99)
Potassium: 3.9 mEq/L (ref 3.5–5.1)
Sodium: 140 mEq/L (ref 135–145)

## 2013-02-16 LAB — LIPID PANEL
Cholesterol: 228 mg/dL — ABNORMAL HIGH (ref 0–200)
HDL: 49.8 mg/dL (ref >39.00)
Total CHOL/HDL Ratio: 5
Triglycerides: 112 mg/dL (ref 0.0–149.0)
VLDL: 22.4 mg/dL (ref 0.0–40.0)

## 2013-02-16 LAB — LDL CHOLESTEROL, DIRECT: Direct LDL: 162.1 mg/dL

## 2013-02-20 ENCOUNTER — Telehealth: Payer: Self-pay | Admitting: Cardiovascular Disease

## 2013-02-20 NOTE — Telephone Encounter (Signed)
PT WAS GIVEN LAB RESULTS.

## 2013-02-20 NOTE — Telephone Encounter (Signed)
New message  ° ° °Returning call back to nurse.  °

## 2013-02-23 ENCOUNTER — Telehealth: Payer: Self-pay | Admitting: *Deleted

## 2013-02-23 NOTE — Telephone Encounter (Signed)
Received a letter of need from patient in mail, mailed to [facility] patient assistance form for zetia

## 2013-04-05 ENCOUNTER — Telehealth: Payer: Self-pay

## 2013-04-05 NOTE — Telephone Encounter (Signed)
Patient called for samples of zetia placed them up front

## 2013-06-29 ENCOUNTER — Encounter: Payer: Self-pay | Admitting: Cardiovascular Disease

## 2013-09-06 ENCOUNTER — Other Ambulatory Visit: Payer: Self-pay

## 2013-09-06 DIAGNOSIS — Z1231 Encounter for screening mammogram for malignant neoplasm of breast: Secondary | ICD-10-CM

## 2013-09-25 ENCOUNTER — Ambulatory Visit
Admission: RE | Admit: 2013-09-25 | Discharge: 2013-09-25 | Disposition: A | Discharge status: Home / Self Care | Payer: Medicare Other | Source: Ambulatory Visit

## 2013-09-25 DIAGNOSIS — Z1231 Encounter for screening mammogram for malignant neoplasm of breast: Secondary | ICD-10-CM

## 2013-11-13 ENCOUNTER — Other Ambulatory Visit: Payer: Self-pay | Admitting: Family Medicine

## 2013-11-13 ENCOUNTER — Ambulatory Visit
Admission: RE | Admit: 2013-11-13 | Discharge: 2013-11-13 | Disposition: A | Discharge status: Home / Self Care | Payer: Medicare Other | Source: Ambulatory Visit | Attending: Family Medicine | Admitting: Family Medicine

## 2013-11-13 DIAGNOSIS — R05 Cough: Secondary | ICD-10-CM

## 2013-11-13 DIAGNOSIS — R059 Cough, unspecified: Secondary | ICD-10-CM

## 2013-12-14 ENCOUNTER — Other Ambulatory Visit (INDEPENDENT_AMBULATORY_CARE_PROVIDER_SITE_OTHER): Payer: Medicare Other | Admitting: *Deleted

## 2013-12-14 ENCOUNTER — Encounter: Payer: Self-pay | Admitting: Cardiovascular Disease

## 2013-12-14 ENCOUNTER — Ambulatory Visit (INDEPENDENT_AMBULATORY_CARE_PROVIDER_SITE_OTHER): Payer: Medicare Other | Admitting: Cardiovascular Disease

## 2013-12-14 VITALS — BP 130/78 | HR 63 | Ht 66.0 in | Wt 155.0 lb

## 2013-12-14 DIAGNOSIS — I5189 Other ill-defined heart diseases: Secondary | ICD-10-CM

## 2013-12-14 DIAGNOSIS — I519 Heart disease, unspecified: Secondary | ICD-10-CM

## 2013-12-14 DIAGNOSIS — R002 Palpitations: Secondary | ICD-10-CM

## 2013-12-14 DIAGNOSIS — I5032 Chronic diastolic (congestive) heart failure: Secondary | ICD-10-CM

## 2013-12-14 DIAGNOSIS — E7800 Pure hypercholesterolemia, unspecified: Secondary | ICD-10-CM

## 2013-12-14 LAB — BASIC METABOLIC PANEL
BUN: 11 mg/dL (ref 6–23)
CO2: 30 mEq/L (ref 19–32)
Calcium: 9.3 mg/dL (ref 8.4–10.5)
Chloride: 100 mEq/L (ref 96–112)
Creatinine, Ser: 0.6 mg/dL (ref 0.4–1.2)
GFR: 109.12 mL/min (ref >60.00)
Glucose, Bld: 104 mg/dL — ABNORMAL HIGH (ref 70–99)
Potassium: 3.7 mEq/L (ref 3.5–5.1)
Sodium: 138 mEq/L (ref 135–145)

## 2013-12-14 LAB — HEPATIC FUNCTION PANEL
ALT: 37 U/L — ABNORMAL HIGH (ref 0–35)
AST: 29 U/L (ref 0–37)
Albumin: 3.8 g/dL (ref 3.5–5.2)
Alkaline Phosphatase: 84 U/L (ref 39–117)
Bilirubin, Direct: 0 mg/dL (ref 0.0–0.3)
Total Bilirubin: 0.7 mg/dL (ref 0.2–1.2)
Total Protein: 7.1 g/dL (ref 6.0–8.3)

## 2013-12-14 LAB — CBC WITH DIFFERENTIAL/PLATELET
Basophils Absolute: 0 10*3/uL (ref 0.0–0.1)
Basophils Relative: 0.2 % (ref 0.0–3.0)
Eosinophils Absolute: 0.1 10*3/uL (ref 0.0–0.7)
Eosinophils Relative: 2.6 % (ref 0.0–5.0)
HCT: 41.5 % (ref 36.0–46.0)
Hemoglobin: 13.7 g/dL (ref 12.0–15.0)
Lymphocytes Relative: 23.5 % (ref 12.0–46.0)
Lymphs Abs: 1.4 10*3/uL (ref 0.7–4.0)
MCHC: 33 g/dL (ref 30.0–36.0)
MCV: 97.1 fl (ref 78.0–100.0)
Monocytes Absolute: 0.5 10*3/uL (ref 0.1–1.0)
Monocytes Relative: 9 % (ref 3.0–12.0)
Neutro Abs: 3.7 10*3/uL (ref 1.4–7.7)
Neutrophils Relative %: 64.7 % (ref 43.0–77.0)
Platelets: 220 10*3/uL (ref 150.0–400.0)
RBC: 4.27 Mil/uL (ref 3.87–5.11)
RDW: 13.1 % (ref 11.5–15.5)
WBC: 5.8 10*3/uL (ref 4.0–10.5)

## 2013-12-14 LAB — LIPID PANEL
Cholesterol: 229 mg/dL — ABNORMAL HIGH (ref 0–200)
HDL: 40.5 mg/dL (ref >39.00)
LDL Cholesterol: 160 mg/dL — ABNORMAL HIGH (ref 0–99)
NonHDL: 188.5
Total CHOL/HDL Ratio: 6
Triglycerides: 141 mg/dL (ref 0.0–149.0)
VLDL: 28.2 mg/dL (ref 0.0–40.0)

## 2013-12-14 NOTE — Patient Instructions (Signed)
Your physician recommends that you continue on your current medications as directed. Please refer to the Current Medication list given to you today.  Your physician has requested that you have an echocardiogram. Echocardiography is a painless test that uses sound waves to create images of your heart. It provides your doctor with information about the size and shape of your heart and how well your heart's chambers and valves are working. This procedure takes approximately one hour. There are no restrictions for this procedure.  Your physician wants you to follow-up in: 1 year with Dr. Acie Fredrickson.  You will receive a reminder letter in the mail two months in advance. If you don't receive a letter, please call our office to schedule the follow-up appointment.

## 2013-12-14 NOTE — Progress Notes (Signed)
Joanne Rogers Date of Birth  [birth year] [facility] [address]    [place], [place]  [address] [phone]  Fax  [phone]  Problem List 1. Hyperlipidemia 2. COPD   History of Present Illness:  Joanne Rogers is a 68 y.o. female with a hx of dyspnea.  She had a normal myoview in 2007.  She has a hx of hyperliidemia.   She had recent lipids but they are not available at this time.  She has been diagnosed with COPD.  She denies any chest pain.  She had some chest wall / breast pain during there last mamogram on July.  She is exercising 2-3 times a week.  No CP or dyspnea.  Dec. 2, 3536:   Joanne Rogers is doing well.  Riding bike and exercising regularly.   Looking for a new job.  No CP or palps  Dec. 3, 1443: Joanne Rogers is doing ok.  No CP,  She has chronic shortness of breath. Has bad allergies. She has normal LV function by echo in 2007.     Current Outpatient Prescriptions on File Prior to Visit  Medication Sig Dispense Refill  . aspirin 81 MG tablet Take 81 mg by mouth daily.      . bisoprolol (ZEBETA) 5 MG tablet Take 1 tablet (5 mg total) by mouth daily. 30 tablet 11  . Cholecalciferol (VITAMIN D) 1000 UNITS capsule Take 1,000 Units by mouth 2 (two) times daily.      . Estradiol (ELESTRIN) 0.52 MG/0.87 GM (0.06%) GEL Place 1 application onto the skin daily.      Marland Kitchen ezetimibe (ZETIA) 10 MG tablet Take 1 tablet (10 mg total) by mouth daily. 90 tablet 3  . Multiple Vitamin (MULTIVITAMIN) tablet Take 1 tablet by mouth daily.    . Omega-3 Fatty Acids (FISH OIL) 1000 MG CAPS Take 2 capsules by mouth 2 (two) times daily.     . progesterone (PROMETRIUM) 200 MG capsule Take 200 mg by mouth as directed.      Marland Kitchen SPIRIVA HANDIHALER 18 MCG inhalation capsule INHALE TWO DOSES EVERY DAY 30 each 4  . valACYclovir (VALTREX) 1000 MG tablet 1/2 daily     No current facility-administered medications on file prior to visit.    Allergies  Allergen Reactions  . Codeine     REACTION: hives   . Crestor [Rosuvastatin Calcium]   . Lovastatin   . Simvastatin     Past Medical History  Diagnosis Date  . Hypercholesterolemia   . Dyspnea   . COPD (chronic obstructive pulmonary disease)   . Palpitations   . Chest pain     Past Surgical History  Procedure Laterality Date  . Cardiovascular stress test  11/16/2005    EF 70%, NO ISCHEMIA  . US echocardiography  11/19/2005    EF 55-60%    History  Smoking status  . Former Smoker -- 0.50 packs/day for 25 years  . Quit date: 05/16/2005  Smokeless tobacco  . Not on file    History  Alcohol Use  . Yes    Comment: social    Family History  Problem Relation Age of Onset  . Breast cancer Mother   . Emphysema Maternal Grandfather     Reviw of Systems:  Reviewed in the HPI.  All other systems are negative.  Physical Exam: BP 130/78 mmHg  Pulse 63  Ht 5\' 6"  (1.676 m)  Wt 155 lb (70.308 kg)  BMI 25.03 kg/m2 The patient is alert and  oriented x 3.  The mood and affect are normal.   Skin: warm and dry.  Color is normal.    HEENT:   Normal carotids, no JVD, mucus membranes are normal.  Lungs: slight wheezing    Heart: RR    Abdomen: +BS, no HSM, non tender  Extremities:  On palpable cords, no c/c/e  Neuro:  Non focal, gait is normal    ECG: Dec. 3, 2015 ; NSR at 63.  Normal ecg.  Assessment / Plan:

## 2013-12-14 NOTE — Assessment & Plan Note (Signed)
Joanne Rogers was found to have diastolic dysfunction her last echocardiogram in 2007. We will repeat the echocardiogram to look to see if that has worsened.  Continue same medications.  I'll  see her in 1 year.

## 2013-12-21 ENCOUNTER — Ambulatory Visit (HOSPITAL_COMMUNITY): Payer: Medicare Other | Attending: Cardiovascular Disease | Admitting: Radiology

## 2013-12-21 ENCOUNTER — Other Ambulatory Visit (HOSPITAL_COMMUNITY): Payer: Medicare Other

## 2013-12-21 DIAGNOSIS — I519 Heart disease, unspecified: Secondary | ICD-10-CM | POA: Insufficient documentation

## 2013-12-21 DIAGNOSIS — I5032 Chronic diastolic (congestive) heart failure: Secondary | ICD-10-CM

## 2013-12-21 DIAGNOSIS — I5189 Other ill-defined heart diseases: Secondary | ICD-10-CM

## 2013-12-21 NOTE — Progress Notes (Signed)
Echocardiogram performed.  

## 2013-12-27 ENCOUNTER — Other Ambulatory Visit: Payer: Self-pay | Admitting: Cardiovascular Disease

## 2014-01-16 ENCOUNTER — Telehealth: Payer: Self-pay | Admitting: Cardiovascular Disease

## 2014-01-16 DIAGNOSIS — H1012 Acute atopic conjunctivitis, left eye: Secondary | ICD-10-CM | POA: Diagnosis not present

## 2014-01-16 NOTE — Telephone Encounter (Signed)
Walk in pt form " [employer]" papers dropped off michele back Thursday will give to her then

## 2014-01-18 ENCOUNTER — Other Ambulatory Visit: Payer: Self-pay | Admitting: *Deleted

## 2014-01-18 MED ORDER — BISOPROLOL FUMARATE 5 MG PO TABS: 5.0000 mg | ORAL_TABLET | Freq: Every day | ORAL | Status: DC

## 2014-02-07 DIAGNOSIS — Z85828 Personal history of other malignant neoplasm of skin: Secondary | ICD-10-CM | POA: Diagnosis not present

## 2014-02-07 DIAGNOSIS — L820 Inflamed seborrheic keratosis: Secondary | ICD-10-CM | POA: Diagnosis not present

## 2014-02-07 DIAGNOSIS — L821 Other seborrheic keratosis: Secondary | ICD-10-CM | POA: Diagnosis not present

## 2014-02-07 DIAGNOSIS — D1801 Hemangioma of skin and subcutaneous tissue: Secondary | ICD-10-CM | POA: Diagnosis not present

## 2014-02-21 DIAGNOSIS — J441 Chronic obstructive pulmonary disease with (acute) exacerbation: Secondary | ICD-10-CM | POA: Diagnosis not present

## 2014-02-21 DIAGNOSIS — R21 Rash and other nonspecific skin eruption: Secondary | ICD-10-CM | POA: Diagnosis not present

## 2014-03-29 DIAGNOSIS — J209 Acute bronchitis, unspecified: Secondary | ICD-10-CM | POA: Diagnosis not present

## 2014-03-29 DIAGNOSIS — J449 Chronic obstructive pulmonary disease, unspecified: Secondary | ICD-10-CM | POA: Diagnosis not present

## 2014-04-17 ENCOUNTER — Telehealth: Payer: Self-pay | Admitting: Cardiovascular Disease

## 2014-04-17 NOTE — Telephone Encounter (Signed)
Spoke with patient and advised her that I have not received paperwork for missing physician ID number.  I advised patient that I will search through envelopes on Dr. Elmarie Shiley desk and will notify her if unable to locate.  Patient states she is out of Zetia.  I advised that I will place samples at the front desk for her to pick up and will notify her regarding paperwork. Patient verbalized understanding and agreement.

## 2014-04-17 NOTE — Telephone Encounter (Signed)
Notified patient that after talking with [employer] Patient Assistance program, I have printed a new application and have completed all sections for provider.  I advised patient that the paperwork will be at the front desk for her to pick up with her samples.  I apologized for the length of time this process has taken.  Patient verbalized understanding and agreement.

## 2014-04-17 NOTE — Telephone Encounter (Signed)
New Message  Pt calling to speak w/ Rn- about form for Zedia/renewing for this year. Per pt- was mailed back to office @ end of Jan- pt not notified (missing ID #). Pt requested to w/ Rn. Please call back and discuss.

## 2014-05-04 DIAGNOSIS — N951 Menopausal and female climacteric states: Secondary | ICD-10-CM | POA: Diagnosis not present

## 2014-05-04 DIAGNOSIS — A609 Anogenital herpesviral infection, unspecified: Secondary | ICD-10-CM | POA: Diagnosis not present

## 2014-05-04 DIAGNOSIS — M8580 Other specified disorders of bone density and structure, unspecified site: Secondary | ICD-10-CM | POA: Diagnosis not present

## 2014-05-04 DIAGNOSIS — Z01419 Encounter for gynecological examination (general) (routine) without abnormal findings: Secondary | ICD-10-CM | POA: Diagnosis not present

## 2014-05-07 ENCOUNTER — Other Ambulatory Visit: Payer: Self-pay | Admitting: Gynecology

## 2014-05-08 ENCOUNTER — Other Ambulatory Visit: Payer: Self-pay | Admitting: Gynecology

## 2014-05-08 DIAGNOSIS — Z1231 Encounter for screening mammogram for malignant neoplasm of breast: Secondary | ICD-10-CM

## 2014-05-08 DIAGNOSIS — M8580 Other specified disorders of bone density and structure, unspecified site: Secondary | ICD-10-CM

## 2014-06-14 DIAGNOSIS — M5442 Lumbago with sciatica, left side: Secondary | ICD-10-CM | POA: Diagnosis not present

## 2014-06-20 DIAGNOSIS — G8929 Other chronic pain: Secondary | ICD-10-CM | POA: Diagnosis not present

## 2014-06-20 DIAGNOSIS — M5442 Lumbago with sciatica, left side: Secondary | ICD-10-CM | POA: Diagnosis not present

## 2014-06-22 DIAGNOSIS — M5126 Other intervertebral disc displacement, lumbar region: Secondary | ICD-10-CM | POA: Diagnosis not present

## 2014-06-25 ENCOUNTER — Telehealth: Payer: Self-pay | Admitting: Cardiovascular Disease

## 2014-07-04 ENCOUNTER — Other Ambulatory Visit: Payer: Self-pay | Admitting: Surgical

## 2014-07-04 DIAGNOSIS — Z0000 Encounter for general adult medical examination without abnormal findings: Secondary | ICD-10-CM | POA: Diagnosis not present

## 2014-07-04 DIAGNOSIS — J449 Chronic obstructive pulmonary disease, unspecified: Secondary | ICD-10-CM | POA: Diagnosis not present

## 2014-07-04 DIAGNOSIS — Z01810 Encounter for preprocedural cardiovascular examination: Secondary | ICD-10-CM | POA: Diagnosis not present

## 2014-07-04 DIAGNOSIS — Z1389 Encounter for screening for other disorder: Secondary | ICD-10-CM | POA: Diagnosis not present

## 2014-07-04 DIAGNOSIS — J301 Allergic rhinitis due to pollen: Secondary | ICD-10-CM | POA: Diagnosis not present

## 2014-07-04 DIAGNOSIS — E780 Pure hypercholesterolemia: Secondary | ICD-10-CM | POA: Diagnosis not present

## 2014-07-09 ENCOUNTER — Other Ambulatory Visit: Payer: Self-pay

## 2014-07-11 NOTE — H&P (Signed)
Joanne Rogers is an 69 y.o. female.   Chief Complaint: low back pain HPI: The patient reports low back symptoms including pain, low back pain and lt leg pain which began years ago without any known injury. Symptoms are reported to be located in the left low back. Symptoms include pain and pain in the calf. The pain radiates to the left buttock, left posterior thigh and left lower leg. The patient describes the pain as sharp, dull, burning, aching, tingling and throbbing. The patient describes the severity of their symptoms as severe. The patient feels as if the symptoms are worsening. Symptoms are exacerbated by standing and sitting. Symptoms are relieved by nonsteroidal anti-inflammatory drugs and non-opioid analgesics. Current treatment includes nonsteroidal anti-inflammatory drugs and non-opioid analgesics. Pertinent medical history includes chronic back pain. Symptoms present at the patient's previous evaluation included back pain. Past treatment has included nonsteroidal anti-inflammatory drugs and non-opioid analgesics. MRI showed a lumbar disc herniation at L4-L5 left.   Past Medical History  Diagnosis Date  . Hypercholesterolemia   . Dyspnea   . COPD (chronic obstructive pulmonary disease)   . Palpitations   . Chest pain     Past Surgical History  Procedure Laterality Date  . Cardiovascular stress test  11/16/2005    EF 70%, NO ISCHEMIA  . US echocardiography  11/19/2005    EF 55-60%    Family History  Problem Relation Age of Onset  . Breast cancer Mother   . Emphysema Maternal Grandfather    Social History:  reports that she quit smoking about 9 years ago. She reports that she drinks alcohol.   Allergies:  Allergies  Allergen Reactions  . Codeine     REACTION: hives  . Crestor [Rosuvastatin Calcium]     Ache, depression.   . Lovastatin Other (See Comments)    Ache, depression.   . Simvastatin Other (See Comments)    Ache, depression.   . Latex Hives and Rash       Current outpatient prescriptions:  .  aspirin 81 MG tablet, Take 81 mg by mouth at bedtime. , Disp: , Rfl:  .  beta carotene w/minerals (OCUVITE) tablet, Take 1 tablet by mouth at bedtime., Disp: , Rfl:  .  bisoprolol (ZEBETA) 5 MG tablet, Take 1 tablet (5 mg total) by mouth daily. (Patient taking differently: Take 5 mg by mouth every morning. ), Disp: 90 tablet, Rfl: 3 .  Estradiol (ELESTRIN) 0.52 MG/0.87 GM (0.06%) GEL, Place 1 application onto the skin every morning. , Disp: , Rfl:  .  ezetimibe (ZETIA) 10 MG tablet, Take 1 tablet (10 mg total) by mouth daily. (Patient taking differently: Take 10 mg by mouth at bedtime. ), Disp: 90 tablet, Rfl: 3 .  Multiple Vitamin (MULTIVITAMIN) tablet, Take 1 tablet by mouth every morning. Alive - 100 mg, Disp: , Rfl:  .  Omega-3 Fatty Acids (FISH OIL) 1000 MG CAPS, Take 1 capsule by mouth every morning. Fish oil '1200mg'$  + Vitamin D, Disp: , Rfl:  .  progesterone (PROMETRIUM) 200 MG capsule, Take 200 mg by mouth See admin instructions. One tablet daily for the first 12 days every four months., Disp: , Rfl:  .  tiotropium (SPIRIVA) 18 MCG inhalation capsule, Place 18 mcg into inhaler and inhale every morning., Disp: , Rfl:  .  valACYclovir (VALTREX) 1000 MG tablet, Take 500 mg by mouth every morning., Disp: , Rfl:  .  VENTOLIN HFA 108 (90 BASE) MCG/ACT inhaler, Inhale 1-2 puffs into the lungs  every 4 (four) hours as needed for wheezing or shortness of breath. , Disp: , Rfl: 1 .  SPIRIVA HANDIHALER 18 MCG inhalation capsule, INHALE TWO DOSES EVERY DAY, Disp: 30 each, Rfl: 4   Review of Systems  Constitutional: Negative.   HENT: Negative.   Eyes: Negative.   Respiratory: Positive for cough and shortness of breath. Negative for hemoptysis, sputum production and wheezing.        SOB with exertion  Cardiovascular: Positive for palpitations. Negative for chest pain, orthopnea, claudication, leg swelling and PND.  Gastrointestinal: Negative.    Genitourinary: Negative.   Musculoskeletal: Positive for myalgias, back pain and joint pain. Negative for falls and neck pain.  Skin: Negative.   Neurological: Negative.   Endo/Heme/Allergies: Negative.   Psychiatric/Behavioral: Negative.    Vitals Weight: 150 lb Height: 66.5 in Body Surface Area: 1.79 m Body Mass Index: 23.85 kg/m Pulse: 68 (Regular) BP: 129/67 (Sitting, Left Arm, Standard)  Physical Exam  Constitutional: She is oriented to person, place, and time. She appears well-developed and well-nourished. No distress.  HENT:  Head: Normocephalic and atraumatic.  Right Ear: External ear normal.  Left Ear: External ear normal.  Nose: Nose normal.  Mouth/Throat: Oropharynx is clear and moist.  Eyes: Conjunctivae and EOM are normal.  Neck: Normal range of motion. Neck supple.  Cardiovascular: Normal rate, regular rhythm, normal heart sounds and intact distal pulses.   No murmur heard. Respiratory: Effort normal and breath sounds normal. No respiratory distress. She has no wheezes.  GI: Soft. Bowel sounds are normal. She exhibits no distension. There is no tenderness.  Neurological: She is alert and oriented to person, place, and time. She has normal reflexes. No sensory deficit.  Marked weakness of her toe extensors on the left compared to the right.   Skin: No rash noted. No erythema.  Psychiatric: She has a normal mood and affect. Her behavior is normal.     Assessment/Plan Lumbar disc herniation L4-L5 left She needs a lumbar hemilaminectomy and microdiscectomy L4-L5 on the left. Risks and benefits of the procedure discussed with the patient by Dr. Latanya Maudlin.   H&P performed by Dr. Latanya Maudlin  Milledge Gerding Ander Purpura 07/11/2014, 2:12 PM

## 2014-07-17 ENCOUNTER — Ambulatory Visit (HOSPITAL_COMMUNITY)
Admission: RE | Admit: 2014-07-17 | Discharge: 2014-07-17 | Disposition: A | Discharge status: Home / Self Care | Payer: Commercial Managed Care - HMO | Source: Ambulatory Visit | Attending: Surgical | Admitting: Surgical

## 2014-07-17 ENCOUNTER — Encounter (HOSPITAL_COMMUNITY): Payer: Self-pay

## 2014-07-17 ENCOUNTER — Encounter (HOSPITAL_COMMUNITY)
Admission: RE | Admit: 2014-07-17 | Discharge: 2014-07-17 | Disposition: A | Discharge status: Home / Self Care | Payer: Commercial Managed Care - HMO | Source: Ambulatory Visit | Attending: Orthopedic Surgery | Admitting: Orthopedic Surgery

## 2014-07-17 DIAGNOSIS — M5136 Other intervertebral disc degeneration, lumbar region: Secondary | ICD-10-CM | POA: Insufficient documentation

## 2014-07-17 DIAGNOSIS — Z01818 Encounter for other preprocedural examination: Secondary | ICD-10-CM

## 2014-07-17 HISTORY — DX: Unspecified osteoarthritis, unspecified site: M19.90

## 2014-07-17 HISTORY — DX: Pneumonia, unspecified organism: J18.9

## 2014-07-17 HISTORY — DX: Other intervertebral disc degeneration, lumbar region without mention of lumbar back pain or lower extremity pain: M51.369

## 2014-07-17 HISTORY — DX: Personal history of other diseases of the respiratory system: Z87.09

## 2014-07-17 HISTORY — DX: Other intervertebral disc degeneration, lumbar region: M51.36

## 2014-07-17 LAB — CBC WITH DIFFERENTIAL/PLATELET
Basophils Absolute: 0 10*3/uL (ref 0.0–0.1)
Basophils Relative: 0 % (ref 0–1)
Eosinophils Absolute: 0.2 10*3/uL (ref 0.0–0.7)
Eosinophils Relative: 3 % (ref 0–5)
HCT: 43.7 % (ref 36.0–46.0)
Hemoglobin: 14.2 g/dL (ref 12.0–15.0)
Lymphocytes Relative: 25 % (ref 12–46)
Lymphs Abs: 2.2 10*3/uL (ref 0.7–4.0)
MCH: 32.6 pg (ref 26.0–34.0)
MCHC: 32.5 g/dL (ref 30.0–36.0)
MCV: 100.5 fL — ABNORMAL HIGH (ref 78.0–100.0)
Monocytes Absolute: 0.5 10*3/uL (ref 0.1–1.0)
Monocytes Relative: 6 % (ref 3–12)
Neutro Abs: 5.9 10*3/uL (ref 1.7–7.7)
Neutrophils Relative %: 66 % (ref 43–77)
Platelets: 234 10*3/uL (ref 150–400)
RBC: 4.35 MIL/uL (ref 3.87–5.11)
RDW: 12.4 % (ref 11.5–15.5)
WBC: 8.9 10*3/uL (ref 4.0–10.5)

## 2014-07-17 LAB — SURGICAL PCR SCREEN
MRSA, PCR: NEGATIVE
Staphylococcus aureus: NEGATIVE

## 2014-07-17 LAB — ABO/RH: ABO/RH(D): O POS

## 2014-07-17 LAB — COMPREHENSIVE METABOLIC PANEL
ALT: 58 U/L — ABNORMAL HIGH (ref 14–54)
AST: 47 U/L — ABNORMAL HIGH (ref 15–41)
Albumin: 4.2 g/dL (ref 3.5–5.0)
Alkaline Phosphatase: 85 U/L (ref 38–126)
Anion gap: 6 (ref 5–15)
BUN: 15 mg/dL (ref 6–20)
CO2: 33 mmol/L — ABNORMAL HIGH (ref 22–32)
Calcium: 9.9 mg/dL (ref 8.9–10.3)
Chloride: 99 mmol/L — ABNORMAL LOW (ref 101–111)
Creatinine, Ser: 0.56 mg/dL (ref 0.44–1.00)
GFR calc Af Amer: >60 mL/min (ref >60)
GFR calc non Af Amer: >60 mL/min (ref >60)
Glucose, Bld: 101 mg/dL — ABNORMAL HIGH (ref 65–99)
Potassium: 5.2 mmol/L — ABNORMAL HIGH (ref 3.5–5.1)
Sodium: 138 mmol/L (ref 135–145)
Total Bilirubin: 0.7 mg/dL (ref 0.3–1.2)
Total Protein: 7.8 g/dL (ref 6.5–8.1)

## 2014-07-17 LAB — APTT: aPTT: 30 seconds (ref 24–37)

## 2014-07-17 LAB — PROTIME-INR
INR: 0.97 (ref 0.00–1.49)
Prothrombin Time: 13.1 seconds (ref 11.6–15.2)

## 2014-07-17 NOTE — Patient Instructions (Addendum)
Joanne Rogers  [birth year]   Your procedure is scheduled on: Friday 07/20/14  Report to [facility]  Entrance take [facility]  elevators to 3rd floor to  [place] at 11:15 AM.  Call this number if you have problems the morning of surgery [phone]   Remember: Please bring inhaler on day of surgery   ONLY 1 PERSON MAY GO WITH YOU TO SHORT STAY TO GET  READY MORNING OF YOUR SURGERY.  Do not eat food or drink liquids :After Midnight.   Take these medicines the morning of surgery with A SIP OF WATER: bisoprolol, spiriva, ventolin inhaler if needed, zyrtec                               You may not have any metal on your body including hair pins and              piercings  Do not wear jewelry, make-up, lotions, powders or perfumes, deodorant             Do not wear nail polish.  Do not shave  48 hours prior to surgery.              Men may shave face and neck.  Do not bring valuables to the hospital. [place].  Contacts, dentures or bridgework may not be worn into surgery.  Leave suitcase in the car. After surgery it may be brought to your room.              Please read over the following fact sheets you were given: MRSA information  _____________________________________________________________________           [facility] - Preparing for Surgery Before surgery, you can play an important role.  Because skin is not sterile, your skin needs to be as free of germs as possible.  You can reduce the number of germs on your skin by washing with CHG (chlorahexidine gluconate) soap before surgery.  CHG is an antiseptic cleaner which kills germs and bonds with the skin to continue killing germs even after washing. Please DO NOT use if you have an allergy to CHG or antibacterial soaps.  If your skin becomes reddened/irritated stop using the CHG and inform your nurse when you arrive at Short Stay. Do not shave (including  legs and underarms) for at least 48 hours prior to the first CHG shower.  You may shave your face/neck. Please follow these instructions carefully:  1.  Shower with CHG Soap the night before surgery and the  morning of Surgery.  2.  If you choose to wash your hair, wash your hair first as usual with your  normal  shampoo.  3.  After you shampoo, rinse your hair and body thoroughly to remove the  shampoo.                            4.  Use CHG as you would any other liquid soap.  You can apply chg directly  to the skin and wash                       Gently with a scrungie or clean  washcloth.  5.  Apply the CHG Soap to your body ONLY FROM THE NECK DOWN.   Do not use on face/ open                           Wound or open sores. Avoid contact with eyes, ears mouth and genitals (private parts).                       Wash face,  Genitals (private parts) with your normal soap.             6.  Wash thoroughly, paying special attention to the area where your surgery  will be performed.  7.  Thoroughly rinse your body with warm water from the neck down.  8.  DO NOT shower/wash with your normal soap after using and rinsing off  the CHG Soap.                9.  Pat yourself dry with a clean towel.            10.  Wear clean pajamas.            11.  Place clean sheets on your bed the night of your first shower and do not  sleep with pets. Day of Surgery : Do not apply any lotions/deodorants the morning of surgery.  Please wear clean clothes to the hospital/surgery center.  FAILURE TO FOLLOW THESE INSTRUCTIONS MAY RESULT IN THE CANCELLATION OF YOUR SURGERY PATIENT SIGNATURE_________________________________  NURSE SIGNATURE__________________________________  ________________________________________________________________________  WHAT IS A BLOOD TRANSFUSION? Blood Transfusion Information  A transfusion is the replacement of blood or some of its parts. Blood is made up of multiple cells which provide  different functions.  Red blood cells carry oxygen and are used for blood loss replacement.  White blood cells fight against infection.  Platelets control bleeding.  Plasma helps clot blood.  Other blood products are available for specialized needs, such as hemophilia or other clotting disorders. BEFORE THE TRANSFUSION  Who gives blood for transfusions?   Healthy volunteers who are fully evaluated to make sure their blood is safe. This is blood bank blood. Transfusion therapy is the safest it has ever been in the practice of medicine. Before blood is taken from a donor, a complete history is taken to make sure that person has no history of diseases nor engages in risky social behavior (examples are intravenous drug use or sexual activity with multiple partners). The donor's travel history is screened to minimize risk of transmitting infections, such as malaria. The donated blood is tested for signs of infectious diseases, such as HIV and hepatitis. The blood is then tested to be sure it is compatible with you in order to minimize the chance of a transfusion reaction. If you or a relative donates blood, this is often done in anticipation of surgery and is not appropriate for emergency situations. It takes many days to process the donated blood. RISKS AND COMPLICATIONS Although transfusion therapy is very safe and saves many lives, the main dangers of transfusion include:  1. Getting an infectious disease. 2. Developing a transfusion reaction. This is an allergic reaction to something in the blood you were given. Every precaution is taken to prevent this. The decision to have a blood transfusion has been considered carefully by your caregiver before blood is given. Blood is not given unless the benefits outweigh the risks. AFTER THE TRANSFUSION  Right after receiving a blood transfusion, you will usually feel much better and more energetic. This is especially true if your red blood cells have  gotten low (anemic). The transfusion raises the level of the red blood cells which carry oxygen, and this usually causes an energy increase.  The nurse administering the transfusion will monitor you carefully for complications. HOME CARE INSTRUCTIONS  No special instructions are needed after a transfusion. You may find your energy is better. Speak with your caregiver about any limitations on activity for underlying diseases you may have. SEEK MEDICAL CARE IF:   Your condition is not improving after your transfusion.  You develop redness or irritation at the intravenous (IV) site. SEEK IMMEDIATE MEDICAL CARE IF:  Any of the following symptoms occur over the next 12 hours:  Shaking chills.  You have a temperature by mouth above 102 F (38.9 C), not controlled by medicine.  Chest, back, or muscle pain.  People around you feel you are not acting correctly or are confused.  Shortness of breath or difficulty breathing.  Dizziness and fainting.  You get a rash or develop hives.  You have a decrease in urine output.  Your urine turns a dark color or changes to pink, red, or brown. Any of the following symptoms occur over the next 10 days:  You have a temperature by mouth above 102 F (38.9 C), not controlled by medicine.  Shortness of breath.  Weakness after normal activity.  The white part of the eye turns yellow (jaundice).  You have a decrease in the amount of urine or are urinating less often.  Your urine turns a dark color or changes to pink, red, or brown. Document Released: 12/27/1999 Document Revised: 03/23/2011 Document Reviewed: 08/15/2007 ExitCare Patient Information 2014 [place].  _______________________________________________________________________  Incentive Spirometer  An incentive spirometer is a tool that can help keep your lungs clear and active. This tool measures how well you are filling your lungs with each breath. Taking long deep breaths  may help reverse or decrease the chance of developing breathing (pulmonary) problems (especially infection) following:  A long period of time when you are unable to move or be active. BEFORE THE PROCEDURE   If the spirometer includes an indicator to show your best effort, your nurse or respiratory therapist will set it to a desired goal.  If possible, sit up straight or lean slightly forward. Try not to slouch.  Hold the incentive spirometer in an upright position. INSTRUCTIONS FOR USE  3. Sit on the edge of your bed if possible, or sit up as far as you can in bed or on a chair. 4. Hold the incentive spirometer in an upright position. 5. Breathe out normally. 6. Place the mouthpiece in your mouth and seal your lips tightly around it. 7. Breathe in slowly and as deeply as possible, raising the piston or the ball toward the top of the column. 8. Hold your breath for 3-5 seconds or for as long as possible. Allow the piston or ball to fall to the bottom of the column. 9. Remove the mouthpiece from your mouth and breathe out normally. 10. Rest for a few seconds and repeat Steps 1 through 7 at least 10 times every 1-2 hours when you are awake. Take your time and take a few normal breaths between deep breaths. 11. The spirometer may include an indicator to show your best effort. Use the indicator as a goal to work toward during each repetition. 12. After  each set of 10 deep breaths, practice coughing to be sure your lungs are clear. If you have an incision (the cut made at the time of surgery), support your incision when coughing by placing a pillow or rolled up towels firmly against it. Once you are able to get out of bed, walk around indoors and cough well. You may stop using the incentive spirometer when instructed by your caregiver.  RISKS AND COMPLICATIONS  Take your time so you do not get dizzy or light-headed.  If you are in pain, you may need to take or ask for pain medication before doing  incentive spirometry. It is harder to take a deep breath if you are having pain. AFTER USE  Rest and breathe slowly and easily.  It can be helpful to keep track of a log of your progress. Your caregiver can provide you with a simple table to help with this. If you are using the spirometer at home, follow these instructions: [place] IF:   You are having difficultly using the spirometer.  You have trouble using the spirometer as often as instructed.  Your pain medication is not giving enough relief while using the spirometer.  You develop fever of 100.5 F (38.1 C) or higher. SEEK IMMEDIATE MEDICAL CARE IF:   You cough up bloody sputum that had not been present before.  You develop fever of 102 F (38.9 C) or greater.  You develop worsening pain at or near the incision site. MAKE SURE YOU:   Understand these instructions.  Will watch your condition.  Will get help right away if you are not doing well or get worse. Document Released: 05/11/2006 Document Revised: 03/23/2011 Document Reviewed: 07/12/2006 [facility] Patient Information 2014 [place], [place].   ________________________________________________________________________

## 2014-07-17 NOTE — Progress Notes (Signed)
Surgery clearance note Dr. Acie Fredrickson 12/14/13 on chart, LOV note 07/04/14 Dr. Inda Merlin on chart, Chest x-ray 11/13/13 on EPIC, EKG 12/14/13 on EPIC

## 2014-07-19 DIAGNOSIS — H1012 Acute atopic conjunctivitis, left eye: Secondary | ICD-10-CM | POA: Diagnosis not present

## 2014-07-20 ENCOUNTER — Inpatient Hospital Stay (HOSPITAL_COMMUNITY): Payer: Commercial Managed Care - HMO | Admitting: Anesthesiology

## 2014-07-20 ENCOUNTER — Encounter (HOSPITAL_COMMUNITY): Payer: Self-pay | Admitting: *Deleted

## 2014-07-20 ENCOUNTER — Inpatient Hospital Stay (HOSPITAL_COMMUNITY): Payer: Commercial Managed Care - HMO

## 2014-07-20 ENCOUNTER — Encounter (HOSPITAL_COMMUNITY)
Admission: RE | Disposition: A | Discharge status: Home / Self Care | Payer: Self-pay | Source: Ambulatory Visit | Attending: Orthopedic Surgery

## 2014-07-20 ENCOUNTER — Ambulatory Visit (HOSPITAL_COMMUNITY)
Admission: RE | Admit: 2014-07-20 | Discharge: 2014-07-21 | Disposition: A | Discharge status: Home / Self Care | Payer: Commercial Managed Care - HMO | Source: Ambulatory Visit | Attending: Orthopedic Surgery | Admitting: Orthopedic Surgery

## 2014-07-20 DIAGNOSIS — E780 Pure hypercholesterolemia: Secondary | ICD-10-CM | POA: Insufficient documentation

## 2014-07-20 DIAGNOSIS — R002 Palpitations: Secondary | ICD-10-CM | POA: Insufficient documentation

## 2014-07-20 DIAGNOSIS — M21372 Foot drop, left foot: Secondary | ICD-10-CM | POA: Diagnosis not present

## 2014-07-20 DIAGNOSIS — I509 Heart failure, unspecified: Secondary | ICD-10-CM | POA: Insufficient documentation

## 2014-07-20 DIAGNOSIS — Z87891 Personal history of nicotine dependence: Secondary | ICD-10-CM | POA: Diagnosis not present

## 2014-07-20 DIAGNOSIS — Z79899 Other long term (current) drug therapy: Secondary | ICD-10-CM | POA: Diagnosis not present

## 2014-07-20 DIAGNOSIS — M545 Low back pain: Secondary | ICD-10-CM | POA: Diagnosis present

## 2014-07-20 DIAGNOSIS — M5126 Other intervertebral disc displacement, lumbar region: Secondary | ICD-10-CM | POA: Diagnosis not present

## 2014-07-20 DIAGNOSIS — Z79890 Hormone replacement therapy (postmenopausal): Secondary | ICD-10-CM | POA: Insufficient documentation

## 2014-07-20 DIAGNOSIS — M4806 Spinal stenosis, lumbar region: Secondary | ICD-10-CM | POA: Insufficient documentation

## 2014-07-20 DIAGNOSIS — J449 Chronic obstructive pulmonary disease, unspecified: Secondary | ICD-10-CM | POA: Diagnosis not present

## 2014-07-20 DIAGNOSIS — M1990 Unspecified osteoarthritis, unspecified site: Secondary | ICD-10-CM | POA: Insufficient documentation

## 2014-07-20 DIAGNOSIS — Z9889 Other specified postprocedural states: Secondary | ICD-10-CM | POA: Diagnosis not present

## 2014-07-20 DIAGNOSIS — Z7982 Long term (current) use of aspirin: Secondary | ICD-10-CM | POA: Diagnosis not present

## 2014-07-20 DIAGNOSIS — Z419 Encounter for procedure for purposes other than remedying health state, unspecified: Secondary | ICD-10-CM

## 2014-07-20 DIAGNOSIS — M48062 Spinal stenosis, lumbar region with neurogenic claudication: Secondary | ICD-10-CM | POA: Diagnosis present

## 2014-07-20 HISTORY — PX: HEMI-MICRODISCECTOMY LUMBAR LAMINECTOMY LEVEL 1: SHX5846

## 2014-07-20 LAB — TYPE AND SCREEN
ABO/RH(D): O POS
Antibody Screen: NEGATIVE

## 2014-07-20 SURGERY — HEMI-MICRODISCECTOMY LUMBAR LAMINECTOMY LEVEL 1
Anesthesia: General | Site: Back | Laterality: Left

## 2014-07-20 MED ORDER — HYDROMORPHONE HCL 1 MG/ML IJ SOLN: 0.5000 mg | INTRAMUSCULAR | Status: DC | PRN

## 2014-07-20 MED ORDER — FENTANYL CITRATE (PF) 100 MCG/2ML IJ SOLN
INTRAMUSCULAR | Status: AC
  Filled 2014-07-20: qty 2

## 2014-07-20 MED ORDER — MENTHOL 3 MG MT LOZG
1.0000 | LOZENGE | OROMUCOSAL | Status: DC | PRN
  Filled 2014-07-20: qty 9

## 2014-07-20 MED ORDER — VALACYCLOVIR HCL 500 MG PO TABS
500.0000 mg | ORAL_TABLET | Freq: Every morning | ORAL | Status: DC
  Filled 2014-07-20: qty 1

## 2014-07-20 MED ORDER — BISACODYL 5 MG PO TBEC: 5.0000 mg | DELAYED_RELEASE_TABLET | Freq: Every day | ORAL | Status: DC | PRN

## 2014-07-20 MED ORDER — LACTATED RINGERS IV SOLN
INTRAVENOUS | Status: DC
  Administered 2014-07-20 (×2): via INTRAVENOUS

## 2014-07-20 MED ORDER — THROMBIN 5000 UNITS EX SOLR
CUTANEOUS | Status: AC
  Filled 2014-07-20: qty 10000

## 2014-07-20 MED ORDER — CEFAZOLIN SODIUM 1-5 GM-% IV SOLN
1.0000 g | Freq: Three times a day (TID) | INTRAVENOUS | Status: DC
  Administered 2014-07-20 – 2014-07-21 (×2): 1 g via INTRAVENOUS
  Filled 2014-07-20 (×3): qty 50

## 2014-07-20 MED ORDER — PROGESTERONE MICRONIZED 200 MG PO CAPS: 200.0000 mg | ORAL_CAPSULE | ORAL | Status: DC

## 2014-07-20 MED ORDER — CEFAZOLIN SODIUM-DEXTROSE 2-3 GM-% IV SOLR
INTRAVENOUS | Status: AC
  Filled 2014-07-20: qty 50

## 2014-07-20 MED ORDER — ROCURONIUM BROMIDE 100 MG/10ML IV SOLN
INTRAVENOUS | Status: AC
Start: 2014-07-20 — End: 2014-07-20
  Filled 2014-07-20: qty 1

## 2014-07-20 MED ORDER — EPHEDRINE SULFATE 50 MG/ML IJ SOLN
INTRAMUSCULAR | Status: DC | PRN
  Administered 2014-07-20: 5 mg via INTRAVENOUS
  Administered 2014-07-20 (×2): 10 mg via INTRAVENOUS

## 2014-07-20 MED ORDER — SODIUM CHLORIDE 0.9 % IR SOLN
Status: AC
  Filled 2014-07-20: qty 1

## 2014-07-20 MED ORDER — LACTATED RINGERS IV SOLN
INTRAVENOUS | Status: DC
  Administered 2014-07-20: 1000 mL via INTRAVENOUS

## 2014-07-20 MED ORDER — BACITRACIN-NEOMYCIN-POLYMYXIN 400-5-5000 EX OINT
TOPICAL_OINTMENT | CUTANEOUS | Status: DC | PRN
  Administered 2014-07-20: 1 via TOPICAL

## 2014-07-20 MED ORDER — GLYCOPYRROLATE 0.2 MG/ML IJ SOLN
INTRAMUSCULAR | Status: AC
  Filled 2014-07-20: qty 1

## 2014-07-20 MED ORDER — ACETAMINOPHEN 325 MG PO TABS: 650.0000 mg | ORAL_TABLET | ORAL | Status: DC | PRN

## 2014-07-20 MED ORDER — PROPOFOL 10 MG/ML IV BOLUS
INTRAVENOUS | Status: DC | PRN
  Administered 2014-07-20: 30 mg via INTRAVENOUS
  Administered 2014-07-20: 130 mg via INTRAVENOUS

## 2014-07-20 MED ORDER — HYDROMORPHONE HCL 2 MG PO TABS: 2.0000 mg | ORAL_TABLET | ORAL | Status: DC | PRN

## 2014-07-20 MED ORDER — METHOCARBAMOL 500 MG PO TABS
500.0000 mg | ORAL_TABLET | Freq: Four times a day (QID) | ORAL | Status: DC | PRN
  Administered 2014-07-20 – 2014-07-21 (×2): 500 mg via ORAL
  Filled 2014-07-20 (×2): qty 1

## 2014-07-20 MED ORDER — BACITRACIN ZINC 500 UNIT/GM EX OINT
TOPICAL_OINTMENT | CUTANEOUS | Status: AC
  Filled 2014-07-20: qty 28.35

## 2014-07-20 MED ORDER — TIOTROPIUM BROMIDE MONOHYDRATE 18 MCG IN CAPS: 18.0000 ug | ORAL_CAPSULE | Freq: Every day | RESPIRATORY_TRACT | Status: DC

## 2014-07-20 MED ORDER — ONDANSETRON HCL 4 MG/2ML IJ SOLN
INTRAMUSCULAR | Status: AC
  Filled 2014-07-20: qty 2

## 2014-07-20 MED ORDER — GLYCOPYRROLATE 0.2 MG/ML IJ SOLN
INTRAMUSCULAR | Status: DC | PRN
  Administered 2014-07-20: 0.6 mg via INTRAVENOUS

## 2014-07-20 MED ORDER — ACETAMINOPHEN 650 MG RE SUPP: 650.0000 mg | RECTAL | Status: DC | PRN

## 2014-07-20 MED ORDER — TIOTROPIUM BROMIDE MONOHYDRATE 18 MCG IN CAPS
18.0000 ug | ORAL_CAPSULE | Freq: Every morning | RESPIRATORY_TRACT | Status: DC
  Administered 2014-07-21: 18 ug via RESPIRATORY_TRACT
  Filled 2014-07-20: qty 5

## 2014-07-20 MED ORDER — HYDROMORPHONE HCL 1 MG/ML IJ SOLN: 0.2500 mg | INTRAMUSCULAR | Status: DC | PRN

## 2014-07-20 MED ORDER — LIDOCAINE HCL (CARDIAC) 20 MG/ML IV SOLN
INTRAVENOUS | Status: DC | PRN
  Administered 2014-07-20: 30 mg via INTRAVENOUS

## 2014-07-20 MED ORDER — BISOPROLOL FUMARATE 5 MG PO TABS
5.0000 mg | ORAL_TABLET | Freq: Every day | ORAL | Status: DC
  Administered 2014-07-21: 5 mg via ORAL
  Filled 2014-07-20: qty 1

## 2014-07-20 MED ORDER — LIDOCAINE HCL (CARDIAC) 20 MG/ML IV SOLN
INTRAVENOUS | Status: AC
  Filled 2014-07-20: qty 5

## 2014-07-20 MED ORDER — POLYETHYLENE GLYCOL 3350 17 G PO PACK: 17.0000 g | PACK | Freq: Every day | ORAL | Status: DC | PRN

## 2014-07-20 MED ORDER — PHENOL 1.4 % MT LIQD: 1.0000 | OROMUCOSAL | Status: DC | PRN

## 2014-07-20 MED ORDER — METHOCARBAMOL 1000 MG/10ML IJ SOLN
500.0000 mg | Freq: Four times a day (QID) | INTRAVENOUS | Status: DC | PRN
  Administered 2014-07-20: 500 mg via INTRAVENOUS
  Filled 2014-07-20 (×2): qty 5

## 2014-07-20 MED ORDER — ONDANSETRON HCL 4 MG/2ML IJ SOLN: 4.0000 mg | INTRAMUSCULAR | Status: DC | PRN

## 2014-07-20 MED ORDER — METHOCARBAMOL 500 MG PO TABS: 500.0000 mg | ORAL_TABLET | Freq: Three times a day (TID) | ORAL | Status: DC | PRN

## 2014-07-20 MED ORDER — THROMBIN 5000 UNITS EX SOLR
OROMUCOSAL | Status: DC | PRN
  Administered 2014-07-20: 15:00:00

## 2014-07-20 MED ORDER — HYDROMORPHONE HCL 2 MG PO TABS
2.0000 mg | ORAL_TABLET | ORAL | Status: DC | PRN
Start: 2014-07-20 — End: 2014-07-21
  Administered 2014-07-20 – 2014-07-21 (×5): 2 mg via ORAL
  Filled 2014-07-20 (×5): qty 1

## 2014-07-20 MED ORDER — EZETIMIBE 10 MG PO TABS
10.0000 mg | ORAL_TABLET | Freq: Every day | ORAL | Status: DC
  Filled 2014-07-20 (×2): qty 1

## 2014-07-20 MED ORDER — PROPOFOL 10 MG/ML IV BOLUS
INTRAVENOUS | Status: AC
Start: 2014-07-20 — End: 2014-07-20
  Filled 2014-07-20: qty 20

## 2014-07-20 MED ORDER — NEOSTIGMINE METHYLSULFATE 10 MG/10ML IV SOLN
INTRAVENOUS | Status: AC
  Filled 2014-07-20: qty 1

## 2014-07-20 MED ORDER — SODIUM CHLORIDE 0.9 % IR SOLN
Status: DC | PRN
  Administered 2014-07-20: 500 mL

## 2014-07-20 MED ORDER — LACTATED RINGERS IV SOLN
INTRAVENOUS | Status: DC
Start: 2014-07-20 — End: 2014-07-21
  Administered 2014-07-20: 100 mL/h via INTRAVENOUS
  Administered 2014-07-21: 03:00:00 via INTRAVENOUS

## 2014-07-20 MED ORDER — BUPIVACAINE-EPINEPHRINE (PF) 0.5% -1:200000 IJ SOLN
INTRAMUSCULAR | Status: AC
  Filled 2014-07-20: qty 30

## 2014-07-20 MED ORDER — ONDANSETRON HCL 4 MG/2ML IJ SOLN
INTRAMUSCULAR | Status: DC | PRN
  Administered 2014-07-20: 4 mg via INTRAVENOUS

## 2014-07-20 MED ORDER — FENTANYL CITRATE (PF) 250 MCG/5ML IJ SOLN
INTRAMUSCULAR | Status: DC | PRN
  Administered 2014-07-20 (×4): 50 ug via INTRAVENOUS

## 2014-07-20 MED ORDER — NEOSTIGMINE METHYLSULFATE 10 MG/10ML IV SOLN
INTRAVENOUS | Status: DC | PRN
  Administered 2014-07-20: 4 mg via INTRAVENOUS

## 2014-07-20 MED ORDER — DEXAMETHASONE SODIUM PHOSPHATE 10 MG/ML IJ SOLN
INTRAMUSCULAR | Status: DC | PRN
  Administered 2014-07-20: 10 mg via INTRAVENOUS

## 2014-07-20 MED ORDER — MIDAZOLAM HCL 2 MG/2ML IJ SOLN
INTRAMUSCULAR | Status: AC
  Filled 2014-07-20: qty 2

## 2014-07-20 MED ORDER — CEFAZOLIN SODIUM-DEXTROSE 2-3 GM-% IV SOLR
2.0000 g | INTRAVENOUS | Status: AC
  Administered 2014-07-20: 2 g via INTRAVENOUS

## 2014-07-20 MED ORDER — BUPIVACAINE-EPINEPHRINE (PF) 0.5% -1:200000 IJ SOLN
INTRAMUSCULAR | Status: DC | PRN
  Administered 2014-07-20: 20 mL

## 2014-07-20 MED ORDER — ROCURONIUM BROMIDE 100 MG/10ML IV SOLN
INTRAVENOUS | Status: DC | PRN
  Administered 2014-07-20: 40 mg via INTRAVENOUS
  Administered 2014-07-20: 10 mg via INTRAVENOUS
  Administered 2014-07-20: 5 mg via INTRAVENOUS

## 2014-07-20 MED ORDER — FLEET ENEMA 7-19 GM/118ML RE ENEM: 1.0000 | ENEMA | Freq: Once | RECTAL | Status: AC | PRN

## 2014-07-20 MED ORDER — ESTRADIOL 0.52 MG/0.87 GM (0.06%) TD GEL: 1.0000 "application " | Freq: Every morning | TRANSDERMAL | Status: DC

## 2014-07-20 MED ORDER — BUPIVACAINE LIPOSOME 1.3 % IJ SUSP
20.0000 mL | Freq: Once | INTRAMUSCULAR | Status: AC
  Administered 2014-07-20: 20 mL
  Filled 2014-07-20: qty 20

## 2014-07-20 MED ORDER — MIDAZOLAM HCL 5 MG/5ML IJ SOLN
INTRAMUSCULAR | Status: DC | PRN
  Administered 2014-07-20: 2 mg via INTRAVENOUS

## 2014-07-20 MED ORDER — ALBUTEROL SULFATE (2.5 MG/3ML) 0.083% IN NEBU: 3.0000 mL | INHALATION_SOLUTION | RESPIRATORY_TRACT | Status: DC | PRN

## 2014-07-20 SURGICAL SUPPLY — 46 items
APL SKNCLS STERI-STRIP NONHPOA (GAUZE/BANDAGES/DRESSINGS) ×1
BAG SPEC THK2 15X12 ZIP CLS (MISCELLANEOUS)
BAG ZIPLOCK 12X15 (MISCELLANEOUS) IMPLANT
BENZOIN TINCTURE PRP APPL 2/3 (GAUZE/BANDAGES/DRESSINGS) ×3 IMPLANT
BLADE SURG SZ10 CARB STEEL (BLADE) ×2 IMPLANT
CLEANER TIP ELECTROSURG 2X2 (MISCELLANEOUS) ×3 IMPLANT
DRAIN PENROSE 18X1/4 LTX STRL (WOUND CARE) IMPLANT
DRAPE MICROSCOPE LEICA (MISCELLANEOUS) ×3 IMPLANT
DRAPE POUCH INSTRU U-SHP 10X18 (DRAPES) ×3 IMPLANT
DRAPE SHEET LG 3/4 BI-LAMINATE (DRAPES) ×4 IMPLANT
DRAPE SURG 17X11 SM STRL (DRAPES) ×3 IMPLANT
DRSG ADAPTIC 3X8 NADH LF (GAUZE/BANDAGES/DRESSINGS) ×3 IMPLANT
DRSG PAD ABDOMINAL 8X10 ST (GAUZE/BANDAGES/DRESSINGS) ×4 IMPLANT
DURAPREP 26ML APPLICATOR (WOUND CARE) ×3 IMPLANT
ELECT BLADE TIP CTD 4 INCH (ELECTRODE) ×3 IMPLANT
ELECT REM PT RETURN 9FT ADLT (ELECTROSURGICAL) ×3
ELECTRODE REM PT RTRN 9FT ADLT (ELECTROSURGICAL) ×1 IMPLANT
GAUZE SPONGE 4X4 12PLY STRL (GAUZE/BANDAGES/DRESSINGS) ×2 IMPLANT
GLOVE BIOGEL PI IND STRL 8 (GLOVE) ×1 IMPLANT
GLOVE BIOGEL PI INDICATOR 8 (GLOVE) ×2
GLOVE ECLIPSE 8.0 STRL XLNG CF (GLOVE) ×2 IMPLANT
GLOVE SURG SS PI 8.0 STRL IVOR (GLOVE) ×1 IMPLANT
GLOVE SURG SS PI 8.5 STRL IVOR (GLOVE) ×8
GLOVE SURG SS PI 8.5 STRL STRW (GLOVE) IMPLANT
GOWN STRL REUS W/TWL XL LVL3 (GOWN DISPOSABLE) ×6 IMPLANT
KIT BASIN OR (CUSTOM PROCEDURE TRAY) ×3 IMPLANT
KIT POSITIONING SURG ANDREWS (MISCELLANEOUS) ×3 IMPLANT
MANIFOLD NEPTUNE II (INSTRUMENTS) ×3 IMPLANT
NDL SPNL 18GX3.5 QUINCKE PK (NEEDLE) ×2 IMPLANT
NEEDLE SPNL 18GX3.5 QUINCKE PK (NEEDLE) ×9 IMPLANT
NS IRRIG 1000ML POUR BTL (IV SOLUTION) IMPLANT
PACK LAMINECTOMY ORTHO (CUSTOM PROCEDURE TRAY) ×3 IMPLANT
PATTIES SURGICAL .5 X.5 (GAUZE/BANDAGES/DRESSINGS) IMPLANT
PATTIES SURGICAL .75X.75 (GAUZE/BANDAGES/DRESSINGS) ×2 IMPLANT
PATTIES SURGICAL 1X1 (DISPOSABLE) IMPLANT
PIN SAFETY NICK PLATE  2 MED (MISCELLANEOUS)
PIN SAFETY NICK PLATE 2 MED (MISCELLANEOUS) IMPLANT
POSITIONER SURGICAL ARM (MISCELLANEOUS) ×3 IMPLANT
SPONGE LAP 4X18 X RAY DECT (DISPOSABLE) ×3 IMPLANT
SPONGE SURGIFOAM ABS GEL 100 (HEMOSTASIS) ×3 IMPLANT
STAPLER VISISTAT 35W (STAPLE) ×3 IMPLANT
SUT VIC AB 0 CT1 27 (SUTURE) ×3
SUT VIC AB 0 CT1 27XBRD ANTBC (SUTURE) ×1 IMPLANT
SUT VIC AB 1 CT1 27 (SUTURE) ×9
SUT VIC AB 1 CT1 27XBRD ANTBC (SUTURE) ×3 IMPLANT
TOWEL OR 17X26 10 PK STRL BLUE (TOWEL DISPOSABLE) ×3 IMPLANT

## 2014-07-20 NOTE — Discharge Instructions (Signed)
Change your dressing daily. °Shower only, no tub bath. °Call if any temperatures greater than 101 or any wound complications: [phone] during the day and ask for Dr. Gioffre's nurse, Tammy Johnson. °

## 2014-07-20 NOTE — Anesthesia Postprocedure Evaluation (Signed)
  Anesthesia Post-op Note  Patient: Joanne Rogers  Procedure(s) Performed: Procedure(s) (LRB): centeral decompression,L4-L5, HEMI-LAMINECTOMY MICRODISCECTOMY L4 - L5 ON THE LEFT  LEVEL 1, forimatomy 4-5 root on the left (Left)  Patient Location: PACU  Anesthesia Type: General  Level of Consciousness: awake and alert   Airway and Oxygen Therapy: Patient Spontanous Breathing  Post-op Pain: mild  Post-op Assessment: Post-op Vital signs reviewed, Patient's Cardiovascular Status Stable, Respiratory Function Stable, Patent Airway and No signs of Nausea or vomiting  Last Vitals:  Filed Vitals:   07/20/14 1730  BP: 121/60  Pulse: 66  Temp: 36.2 C  Resp: 16    Post-op Vital Signs: stable   Complications: No apparent anesthesia complications

## 2014-07-20 NOTE — Anesthesia Preprocedure Evaluation (Addendum)
Anesthesia Evaluation  Patient identified by MRN, date of birth, ID band Patient awake    Reviewed: Allergy & Precautions, NPO status , Patient's Chart, lab work & pertinent test results  Airway Mallampati: II  TM Distance: >3 FB Neck ROM: Full    Dental no notable dental hx.    Pulmonary shortness of breath, pneumonia -, resolved, COPD COPD inhaler, former smoker,  breath sounds clear to auscultation  Pulmonary exam normal       Cardiovascular +CHF Normal cardiovascular examRhythm:Regular Rate:Normal     Neuro/Psych negative neurological ROS  negative psych ROS   GI/Hepatic negative GI ROS, Neg liver ROS,   Endo/Other  negative endocrine ROS  Renal/GU negative Renal ROS  negative genitourinary   Musculoskeletal  (+) Arthritis -, Osteoarthritis,    Abdominal   Peds negative pediatric ROS (+)  Hematology negative hematology ROS (+)   Anesthesia Other Findings   Reproductive/Obstetrics negative OB ROS                             Anesthesia Physical Anesthesia Plan  ASA: III  Anesthesia Plan: General   Post-op Pain Management:    Induction: Intravenous  Airway Management Planned: Oral ETT  Additional Equipment:   Intra-op Plan:   Post-operative Plan: Extubation in OR  Informed Consent: I have reviewed the patients History and Physical, chart, labs and discussed the procedure including the risks, benefits and alternatives for the proposed anesthesia with the patient or authorized representative who has indicated his/her understanding and acceptance.   Dental advisory given  Plan Discussed with: CRNA  Anesthesia Plan Comments:         Anesthesia Quick Evaluation

## 2014-07-20 NOTE — Anesthesia Procedure Notes (Signed)
Procedure Name: Intubation Date/Time: 07/20/2014 1:31 PM Performed by: Chandra Batch A Pre-anesthesia Checklist: Patient identified, Timeout performed, Emergency Drugs available, Suction available and Patient being monitored Patient Re-evaluated:Patient Re-evaluated prior to inductionOxygen Delivery Method: Circle system utilized Preoxygenation: Pre-oxygenation with 100% oxygen Intubation Type: IV induction Ventilation: Mask ventilation without difficulty Laryngoscope Size: Mac and 3 Grade View: Grade I Tube type: Oral Tube size: 7.5 mm Number of attempts: 1 Airway Equipment and Method: Stylet Placement Confirmation: ETT inserted through vocal cords under direct vision,  positive ETCO2 and breath sounds checked- equal and bilateral Secured at: 21 cm Tube secured with: Tape Dental Injury: Teeth and Oropharynx as per pre-operative assessment

## 2014-07-20 NOTE — Brief Op Note (Signed)
07/20/2014  2:95 PM  PATIENT:  Joanne Rogers  69 y.o. female  PRE-OPERATIVE DIAGNOSIS:  Herniated Lumbar disc on the Left at L-4-L-5  and Spinal Stenosis. Foot Drop on The Left.  POST-OPERATIVE DIAGNOSIS: Same as Pre-Op  PROCEDURE:  Procedure(s): centeral decompression,L4-L5, HEMI-LAMINECTOMY MICRODISCECTOMY L4 - L5 ON THE LEFT  LEVEL 1, forimatomy 4-5 root on the left (Left)  SURGEON:  Surgeon(s) and Role:    * Latanya Maudlin, MD - Primary    * Magnus Sinning, MD - Assisting    ASSISTANTS: Tarri Glenn MD  ANESTHESIA:   general  EBL:  Total I/O In: 1500 [I.V.:1500] Out: 100 [Blood:100]  BLOOD ADMINISTERED:none  DRAINS: none   LOCAL MEDICATIONS USED:  MARCAINE 20cc of 0.50% with Epinephrine at start of the Case and Exparel 20cc at the end of the case.    SPECIMEN:  No Specimen  DISPOSITION OF SPECIMEN:  N/A  COUNTS:  YES  TOURNIQUET:  * No tourniquets in log *  DICTATION: .Other Dictation: Dictation Number [id number]  PLAN OF CARE: Admit for overnight observation  PATIENT DISPOSITION:  PACU - hemodynamically stable.   Delay start of Pharmacological VTE agent (>24hrs) due to surgical blood loss or risk of bleeding: yes

## 2014-07-20 NOTE — Interval H&P Note (Signed)
History and Physical Interval Note:  03/19/8873 7:97 PM  Joanne Rogers  has presented today for surgery, with the diagnosis of herniated disc  The various methods of treatment have been discussed with the patient and family. After consideration of risks, benefits and other options for treatment, the patient has consented to  Procedure(s): HEMI-LAMINECTOMY MICRODISCECTOMY L4 - L5 ON THE LEFT  LEVEL 1 (Left) as a surgical intervention .  The patient's history has been reviewed, patient examined, no change in status, stable for surgery.  I have reviewed the patient's chart and labs.  Questions were answered to the patient's satisfaction.     Masey Scheiber A

## 2014-07-20 NOTE — Transfer of Care (Signed)
Immediate Anesthesia Transfer of Care Note  Patient: Joanne Rogers  Procedure(s) Performed: Procedure(s): centeral decompression,L4-L5, HEMI-LAMINECTOMY MICRODISCECTOMY L4 - L5 ON THE LEFT  LEVEL 1, forimatomy 4-5 root on the left (Left)  Patient Location: PACU  Anesthesia Type:General  Level of Consciousness: awake, alert , oriented and patient cooperative  Airway & Oxygen Therapy: Patient Spontanous Breathing and Patient connected to face mask oxygen  Post-op Assessment: Report given to RN and Post -op Vital signs reviewed and stable  Post vital signs: Reviewed and stable  Last Vitals: There were no vitals filed for this visit.  Complications: No apparent anesthesia complications

## 2014-07-21 DIAGNOSIS — Z7982 Long term (current) use of aspirin: Secondary | ICD-10-CM | POA: Diagnosis not present

## 2014-07-21 DIAGNOSIS — M4806 Spinal stenosis, lumbar region: Secondary | ICD-10-CM | POA: Diagnosis not present

## 2014-07-21 DIAGNOSIS — M4326 Fusion of spine, lumbar region: Secondary | ICD-10-CM | POA: Diagnosis not present

## 2014-07-21 DIAGNOSIS — M21372 Foot drop, left foot: Secondary | ICD-10-CM | POA: Diagnosis not present

## 2014-07-21 DIAGNOSIS — Z79890 Hormone replacement therapy (postmenopausal): Secondary | ICD-10-CM | POA: Diagnosis not present

## 2014-07-21 DIAGNOSIS — E780 Pure hypercholesterolemia: Secondary | ICD-10-CM | POA: Diagnosis not present

## 2014-07-21 DIAGNOSIS — Z79899 Other long term (current) drug therapy: Secondary | ICD-10-CM | POA: Diagnosis not present

## 2014-07-21 DIAGNOSIS — J449 Chronic obstructive pulmonary disease, unspecified: Secondary | ICD-10-CM | POA: Diagnosis not present

## 2014-07-21 DIAGNOSIS — M5126 Other intervertebral disc displacement, lumbar region: Secondary | ICD-10-CM | POA: Diagnosis not present

## 2014-07-21 DIAGNOSIS — Z87891 Personal history of nicotine dependence: Secondary | ICD-10-CM | POA: Diagnosis not present

## 2014-07-21 NOTE — Op Note (Signed)
Joanne Rogers, Joanne Rogers NO.:  [id number]  MEDICAL RECORD NO.:  [id number]  LOCATION:  7846                         FACILITY:  [facility]  PHYSICIAN:  Kipp Brood. Shams Fill, M.D.DATE OF BIRTH:  August 01, 1945  DATE OF PROCEDURE:  07/20/2014 DATE OF DISCHARGE:                              OPERATIVE REPORT   SURGEON:  Kipp Brood. Gladstone Lighter, M.D.  ASSISTANT:  Tarri Glenn, M.D.  PREOPERATIVE DIAGNOSES: 1. Spinal stenosis at L4-5. 2. Large herniated disk at L4-5 on the left. 3. Partial footdrop on the left. 4. Foraminal stenosis for the L5 root. 5. Foraminal stenosis for the L4 root on the left.  POSTOPERATIVE DIAGNOSES: 1. Spinal stenosis at L4-5. 2. Large herniated disk at L4-5 on the left. 3. Partial footdrop on the left. 4. Foraminal stenosis for the L5 root. 5. Foraminal stenosis for the L4 root on the left.  OPERATION: 1. Central decompressive lumbar laminectomy for spinal stenosis at L4-     5. 2. Microdiskectomy at L4-5 on the left. 3. Foraminotomy for the L4 root on the left. 4. Foraminotomy for the L5 root on the left.  DESCRIPTION OF PROCEDURE:  Under general anesthesia with the patient on spinal frame, routine orthopedic prep and draping of the lower back was carried out.  Appropriate time-out was first carried out.  I also marked the appropriate left side of the back in the operating room even though we were going central.  I marked the left side because the foot drop was on the left, the disk herniation was on the left.  After all the time- out, etc., was done and prep was done, I put 2 needles on her back for localization purposes and x-ray was taken.  At this time, an incision was made over the L4-5 interspace.  The incision was extended proximally and distally.  The muscle then was stripped from the lamina spinous process at L4-5, another x-ray was taken with instruments in place. Following that, I then went down centrally because of the large disk  and stenosis, I removed a part of the spinous process.  The spinous process I removed was part of L5.  I went down central and did my laminectomy as well at the same time.  Of note, great care was taken to protect the dura at all time, the microscope was brought in.  Once we were down to the ligamentum flavum, I then dissected out laterally and decompressed the lateral recess on the left.  We then gently removed the ligamentum flavum from the dura, we exposed the dura and protected the dura with cottonoids at all time.  I went out far laterally and decompressed the lateral recess.  I cauterized the lateral recess veins after retracting the dura and nerve root gently with the D'Errico retractor.  At this time, a needle was placed in the disk space, an x-ray was taken to verify we were at L4-5.  Following that, we then made a cruciate incision in the posterior longitudinal ligament.  I utilized the nerve hook and Epstein curettes and literally teased out a large fragment, actually the size about a 1.5 x 1.5 cm fragment that was subligamentous, migrated caudally.  This fragment was removed.  I then went into the disk space with the pituitary rongeurs.  I then on several occasions utilized the nerve hook and the Epstein curettes to make sure there were no other loose fragments.  Following this, I did foraminotomies for the 4 and 5 root.  We utilized a hockey-stick to go distally, proximally, and out the foramina and now the root and dura were completely free.  We thoroughly irrigated out the area, loosely applied some thrombin-soaked Gelfoam, and the wound was closed in layers in the usual fashion.  Note, I left a small portion distally and proximally of the wound open for drainage purposes.  I then injected 20 mL of Exparel at the end of the case.  Prior to starting the case, I injected 20 mL of 0.5% Marcaine with epinephrine into the soft tissue to control bleeding.  The remaining part of  the wound was closed with #1 Vicryl after we closed the deep layer and the subcu, the skin was closed with staples.  Sterile Neosporin dressings were applied.  The patient had 2 g of IV Ancef preop.          ______________________________ Kipp Brood. Gladstone Lighter, M.D.     RAG/MEDQ  D:  07/20/2014  T:  07/21/2014  Job:  [id number]

## 2014-07-21 NOTE — Progress Notes (Signed)
D/C instructions gone over with patient and family. All questions answered and dressing changed. Pt already has prescriptions filled at home.

## 2014-07-21 NOTE — Progress Notes (Signed)
Subjective: 1 Day Post-Op Procedure(s) (LRB): centeral decompression,L4-L5, HEMI-LAMINECTOMY MICRODISCECTOMY L4 - L5 ON THE LEFT  LEVEL 1, forimatomy 4-5 root on the left (Left) Patient reports pain as 1 on 0-10 scale.Doing well will DC today.    Objective: Vital signs in last 24 hours: Temp:  [97.2 F (36.2 C)-98.7 F (37.1 C)] 98.5 F (36.9 C) (07/09 0600) Pulse Rate:  [66-91] 73 (07/09 0600) Resp:  [10-16] 16 (07/09 0600) BP: (84-134)/(44-67) 116/49 mmHg (07/09 0620) SpO2:  [95 %-100 %] 96 % (07/09 0600) Weight:  [72.576 kg (160 lb)] 72.576 kg (160 lb) (07/08 1630)  Intake/Output from previous day: 07/08 0701 - 07/09 0700 In: 3670 [P.O.:720; I.V.:2900; IV Piggyback:50] Out: 900 [Urine:800; Blood:100] Intake/Output this shift:    No results for input(s): HGB in the last 72 hours. No results for input(s): WBC, RBC, HCT, PLT in the last 72 hours. No results for input(s): NA, K, CL, CO2, BUN, CREATININE, GLUCOSE, CALCIUM in the last 72 hours. No results for input(s): LABPT, INR in the last 72 hours.  Dorsiflexion/Plantar flexion intact  Assessment/Plan: 1 Day Post-Op Procedure(s) (LRB): centeral decompression,L4-L5, HEMI-LAMINECTOMY MICRODISCECTOMY L4 - L5 ON THE LEFT  LEVEL 1, forimatomy 4-5 root on the left (Left) Discharge home with home health  Merrissa Giacobbe A 07/21/2014, 7:17 AM

## 2014-07-21 NOTE — Evaluation (Signed)
Physical Therapy Evaluation Patient Details Name: BEYA TIPPS MRN: [id number] DOB: 1945-06-05 Today's Date: 07/21/2014   History of Present Illness  centeral decompression,L4-L5, HEMI-LAMINECTOMY MICRODISCECTOMY L4 - L5 ON THE LEFT LEVEL 1, forimatomy 4-5 root on the left (Left)  Clinical Impression  Patient ambulating well. Family present for instructions.    Follow Up Recommendations No PT follow up    Equipment Recommendations  None recommended by PT    Recommendations for Other Services       Precautions / Restrictions Precautions Precautions: Back Precaution Booklet Issued: Yes (comment) Restrictions Weight Bearing Restrictions: No      Mobility  Bed Mobility                  Transfers Overall transfer level: Needs assistance Equipment used: Rolling walker (2 wheeled) Transfers: Sit to/from Stand Sit to Stand: Supervision         General transfer comment: cues for hand placement  Ambulation/Gait Ambulation/Gait assistance: Supervision Ambulation Distance (Feet): 400 Feet Assistive device: Rolling walker (2 wheeled) Gait Pattern/deviations: Step-through pattern     General Gait Details: cues for posture  Stairs Stairs: Yes Stairs assistance: Supervision Stair Management: Two rails;Step to pattern;Forwards Number of Stairs: 3    Wheelchair Mobility    Modified Rankin (Stroke Patients Only)       Balance                                             Pertinent Vitals/Pain Pain Assessment: 0-10 Pain Score: 5  Pain Location: back Pain Descriptors / Indicators: Sore;Tightness Pain Intervention(s): Monitored during session;Premedicated before session;Repositioned    Home Living   Living Arrangements: Children  Has 3 steps to enter with rails, bedroom on second level, to get a hospital bed.                    Prior Function Level of Independence: Independent               Hand Dominance         Extremity/Trunk Assessment               Lower Extremity Assessment: Overall WFL for tasks assessed      Cervical / Trunk Assessment: Normal  Communication   Communication: No difficulties  Cognition Arousal/Alertness: Awake/alert Behavior During Therapy: WFL for tasks assessed/performed Overall Cognitive Status: Within Functional Limits for tasks assessed                      General Comments      Exercises        Assessment/Plan    PT Assessment Patent does not need any further PT services  PT Diagnosis Difficulty walking;Acute pain   PT Problem List    PT Treatment Interventions     PT Goals (Current goals can be found in the Care Plan section) Acute Rehab PT Goals Patient Stated Goal: to walk without pain PT Goal Formulation: All assessment and education complete, DC therapy    Frequency     Barriers to discharge        Co-evaluation               End of Session   Activity Tolerance: Patient tolerated treatment well Patient left:  (up with OT) Nurse Communication: Mobility status    Functional Assessment Tool Used:  clinical judgement Functional Limitation: Mobility: Walking and moving around Mobility: Walking and Moving Around Current Status [phone]): At least 1 percent but less than 20 percent impaired, limited or restricted Mobility: Walking and Moving Around Goal Status [phone]): At least 1 percent but less than 20 percent impaired, limited or restricted Mobility: Walking and Moving Around Discharge Status [phone]): At least 1 percent but less than 20 percent impaired, limited or restricted    Time: 0850-0905 PT Time Calculation (min) (ACUTE ONLY): 15 min   Charges:   PT Evaluation $Initial PT Evaluation Tier I: 1 Procedure     PT G Codes:   PT G-Codes **NOT FOR INPATIENT CLASS** Functional Assessment Tool Used: clinical judgement Functional Limitation: Mobility: Walking and moving around Mobility: Walking and Moving Around  Current Status (H4193): At least 1 percent but less than 20 percent impaired, limited or restricted Mobility: Walking and Moving Around Goal Status [phone]): At least 1 percent but less than 20 percent impaired, limited or restricted Mobility: Walking and Moving Around Discharge Status [phone]): At least 1 percent but less than 20 percent impaired, limited or restricted    Claretha Cooper 07/21/2014, 11:48 AM

## 2014-07-21 NOTE — Evaluation (Signed)
Occupational Therapy One Time Evaluation Patient Details Name: Joanne Rogers MRN: [id number] DOB: 12/01/1945 Today's Date: 07/21/2014    History of Present Illness centeral decompression,L4-L5, HEMI-LAMINECTOMY MICRODISCECTOMY L4 - L5 ON THE LEFT LEVEL 1, forimatomy 4-5 root on the left (Left)   Clinical Impression   Pt overall at supervision level for functional transfers with occasional cues for back precautions. She needs more assist for LB self care but does have a reacher and assist available at home. Educated and practiced with AE for LB dressing. Also explained that pt will benefit from a toilet aid as she cant reacher periareas without breaking precautions. Pt plans to have daughter obtain toilet aid for her. All education completed. Pt for d/c today.    Follow Up Recommendations  No OT follow up;Supervision/Assistance - 24 hour    Equipment Recommendations  None recommended by OT    Recommendations for Other Services       Precautions / Restrictions Precautions Precautions: Back Precaution Booklet Issued: Yes (comment) Precaution Comments: Reviewed all back precautions with pt/family.       Mobility Bed Mobility Overal bed mobility: Needs Assistance Bed Mobility: Rolling;Sit to Sidelying Rolling: Min guard       Sit to sidelying: Min guard;HOB elevated General bed mobility comments: increased time and use of rail. pt will have hospital bed at home.   Transfers Overall transfer level: Needs assistance Equipment used: Rolling walker (2 wheeled) Transfers: Sit to/from Stand Sit to Stand: Supervision         General transfer comment: cues for hand placement and back precautions.     Balance                                            ADL Overall ADL's : Needs assistance/impaired Eating/Feeding: Independent;Sitting   Grooming: Wash/dry hands;Supervision/safety;Standing   Upper Body Bathing: Set up;Sitting   Lower Body Bathing:  Moderate assistance;Sit to/from stand   Upper Body Dressing : Set up;Supervision/safety;Sitting   Lower Body Dressing: Moderate assistance;Sit to/from stand   Toilet Transfer: Supervision/safety;Ambulation;BSC;RW   Toileting- Clothing Manipulation and Hygiene: Total assistance;Sit to/from stand         General ADL Comments: Pt plans to stay on the main level and tub/shower is upstairs. She will sponge bathe initialy. Discussed AE options for LB self care as pt not able to cross LEs all the way up to don clothing but she does have a reacher. PRacticed using reacher to don underwear but she needs cues to not reach down below knees to pull up underwear but rather use reacher to continue pulling up underwear. She states family can also assist. She is unable to reach front or posterior periarea without breaking precuations so educated on toilet aid option and coverage and pt plans to have family obtain. discussed use of LHS also for washing periareas so she doesnt break precautions. She needs interittent cues to not twist and look around behind her. Attempted to reposition in sidelying but pt not comfortable so assisted pt with placing pillow under knees on her back. Issued handout and reviewed all back precautions.      Vision     Perception     Praxis      Pertinent Vitals/Pain Pain Assessment: 0-10 Pain Score: 5  Pain Location: back Pain Descriptors / Indicators: Sore;Tightness Pain Intervention(s): Monitored during session;Premedicated before session;Repositioned  Hand Dominance     Extremity/Trunk Assessment Upper Extremity Assessment Upper Extremity Assessment: Overall WFL for tasks assessed      Cervical / Trunk Assessment Cervical / Trunk Assessment: Normal   Communication Communication Communication: No difficulties   Cognition Arousal/Alertness: Awake/alert Behavior During Therapy: WFL for tasks assessed/performed Overall Cognitive Status: Within Functional  Limits for tasks assessed                     General Comments       Exercises       Shoulder Instructions      Home Living Family/patient expects to be discharged to:: Private residence Living Arrangements: Children                 Bathroom Shower/Tub: Tub/shower unit (upstairs)   Bathroom Toilet: Handicapped height     Home Equipment: Bedside commode;Adaptive equipment Adaptive Equipment: Reacher        Prior Functioning/Environment Level of Independence: Independent             OT Diagnosis: Generalized weakness   OT Problem List:     OT Treatment/Interventions:      OT Goals(Current goals can be found in the care plan section) Acute Rehab OT Goals Patient Stated Goal: to walk without pain OT Goal Formulation: With patient/family  OT Frequency:     Barriers to D/C:            Co-evaluation              End of Session Equipment Utilized During Treatment: Rolling walker  Activity Tolerance: Patient tolerated treatment well Patient left: in bed;with call bell/phone within reach;with family/visitor present   Time: 2162-4469 OT Time Calculation (min): 36 min Charges:  OT General Charges $OT Visit: 1 Procedure OT Evaluation $Initial OT Evaluation Tier I: 1 Procedure OT Treatments $Therapeutic Activity: 8-22 mins G-Codes: OT G-codes **NOT FOR INPATIENT CLASS** Functional Assessment Tool Used: clinical judgement Functional Limitation: Self care Self Care Current Status (F0722): At least 60 percent but less than 80 percent impaired, limited or restricted Self Care Goal Status ([id number]): At least 60 percent but less than 80 percent impaired, limited or restricted Self Care Discharge Status [phone]): At least 60 percent but less than 80 percent impaired, limited or restricted  [name], [place] 07/21/2014, 1:01 PM

## 2014-07-23 ENCOUNTER — Encounter (HOSPITAL_COMMUNITY): Payer: Self-pay | Admitting: Orthopedic Surgery

## 2014-07-24 NOTE — Discharge Summary (Signed)
Physician Discharge Summary   Patient ID: Joanne Rogers MRN: [id number] DOB/AGE: 1945/07/24 69 y.o.  Admit date: 07/20/2014 Discharge date: 07/21/2014  Primary Diagnosis: Lumbar spinal stenosis   Admission Diagnoses:  Past Medical History  Diagnosis Date  . Hypercholesterolemia   . Dyspnea   . COPD (chronic obstructive pulmonary disease)   . Palpitations     "fluttering"  . Pneumonia     hx of  . H/O bronchitis     allergy related  . Arthritis     back, hands, neck  . DDD (degenerative disc disease), lumbar    Discharge Diagnoses:   Active Problems:   Spinal stenosis, lumbar region, with neurogenic claudication  Estimated body mass index is 24.68 kg/(m^2) as calculated from the following:   Height as of this encounter: 5' 7.5" (1.715 m).   Weight as of this encounter: 72.576 kg (160 lb).  Procedure:  Procedure(s) (LRB): centeral decompression,L4-L5, HEMI-LAMINECTOMY MICRODISCECTOMY L4 - L5 ON THE LEFT  LEVEL 1, forimatomy 4-5 root on the left (Left)   Consults: None  HPI: The patient reports low back symptoms including pain, low back pain and lt leg pain which began years ago without any known injury. Symptoms are reported to be located in the left low back. Symptoms include pain and pain in the calf. The pain radiates to the left buttock, left posterior thigh and left lower leg. The patient describes the pain as sharp, dull, burning, aching, tingling and throbbing. The patient describes the severity of their symptoms as severe. The patient feels as if the symptoms are worsening. Symptoms are exacerbated by standing and sitting. Symptoms are relieved by nonsteroidal anti-inflammatory drugs and non-opioid analgesics. Current treatment includes nonsteroidal anti-inflammatory drugs and non-opioid analgesics. Pertinent medical history includes chronic back pain. Symptoms present at the patient's previous evaluation included back pain. Past treatment has included nonsteroidal  anti-inflammatory drugs and non-opioid analgesics. MRI showed a lumbar disc herniation at L4-L5 left.   Laboratory Data: Hospital Outpatient Visit on 07/17/2014  Component Date Value Ref Range Status  . ABO/RH(D) 07/17/2014 O POS   Final  Hospital Outpatient Visit on 07/17/2014  Component Date Value Ref Range Status  . MRSA, PCR 07/17/2014 NEGATIVE  NEGATIVE Final  . Staphylococcus aureus 07/17/2014 NEGATIVE  NEGATIVE Final   Comment:        The Xpert SA Assay (FDA approved for NASAL specimens in patients over 59 years of age), is one component of a comprehensive surveillance program.  Test performance has been validated by [facility] for patients greater than or equal to 18 year old. It is not intended to diagnose infection nor to guide or monitor treatment.   Marland Kitchen aPTT 07/17/2014 30  24 - 37 seconds Final  . WBC 07/17/2014 8.9  4.0 - 10.5 K/uL Final  . RBC 07/17/2014 4.35  3.87 - 5.11 MIL/uL Final  . Hemoglobin 07/17/2014 14.2  12.0 - 15.0 g/dL Final  . HCT 07/17/2014 43.7  36.0 - 46.0 % Final  . MCV 07/17/2014 100.5* 78.0 - 100.0 fL Final  . MCH 07/17/2014 32.6  26.0 - 34.0 pg Final  . MCHC 07/17/2014 32.5  30.0 - 36.0 g/dL Final  . RDW 07/17/2014 12.4  11.5 - 15.5 % Final  . Platelets 07/17/2014 234  150 - 400 K/uL Final  . Neutrophils Relative % 07/17/2014 66  43 - 77 % Final  . Neutro Abs 07/17/2014 5.9  1.7 - 7.7 K/uL Final  . Lymphocytes Relative 07/17/2014 25  12 -  46 % Final  . Lymphs Abs 07/17/2014 2.2  0.7 - 4.0 K/uL Final  . Monocytes Relative 07/17/2014 6  3 - 12 % Final  . Monocytes Absolute 07/17/2014 0.5  0.1 - 1.0 K/uL Final  . Eosinophils Relative 07/17/2014 3  0 - 5 % Final  . Eosinophils Absolute 07/17/2014 0.2  0.0 - 0.7 K/uL Final  . Basophils Relative 07/17/2014 0  0 - 1 % Final  . Basophils Absolute 07/17/2014 0.0  0.0 - 0.1 K/uL Final  . Sodium 07/17/2014 138  135 - 145 mmol/L Final  . Potassium 07/17/2014 5.2* 3.5 - 5.1 mmol/L Final  . Chloride  07/17/2014 99* 101 - 111 mmol/L Final  . CO2 07/17/2014 33* 22 - 32 mmol/L Final  . Glucose, Bld 07/17/2014 101* 65 - 99 mg/dL Final  . BUN 07/17/2014 15  6 - 20 mg/dL Final  . Creatinine, Ser 07/17/2014 0.56  0.44 - 1.00 mg/dL Final  . Calcium 07/17/2014 9.9  8.9 - 10.3 mg/dL Final  . Total Protein 07/17/2014 7.8  6.5 - 8.1 g/dL Final  . Albumin 07/17/2014 4.2  3.5 - 5.0 g/dL Final  . AST 07/17/2014 47* 15 - 41 U/L Final  . ALT 07/17/2014 58* 14 - 54 U/L Final  . Alkaline Phosphatase 07/17/2014 85  38 - 126 U/L Final  . Total Bilirubin 07/17/2014 0.7  0.3 - 1.2 mg/dL Final  . GFR calc non Af Amer 07/17/2014 >60  >60 mL/min Final  . GFR calc Af Amer 07/17/2014 >60  >60 mL/min Final   Comment: (NOTE) The eGFR has been calculated using the CKD EPI equation. This calculation has not been validated in all clinical situations. eGFR's persistently <60 mL/min signify possible Chronic Kidney Disease.   . Anion gap 07/17/2014 6  5 - 15 Final  . Prothrombin Time 07/17/2014 13.1  11.6 - 15.2 seconds Final  . INR 07/17/2014 0.97  0.00 - 1.49 Final  . ABO/RH(D) 07/17/2014 O POS   Final  . Antibody Screen 07/17/2014 NEG   Final  . Sample Expiration 07/17/2014 07/23/2014   Final     X-Rays:Dg Lumbar Spine 2-3 Views  07/17/2014   CLINICAL DATA:  Preop for surgery at L4-5  EXAM: LUMBAR SPINE - 2-3 VIEW  COMPARISON:  The only images for establishing accurate numbering system are scout views from CT of the abdomen of 07/19/2003 and CT of the pelvis of 07/25/2003. If interval studies have been performed then direct comparison with those outside imaging studies is recommended.  FINDINGS: Compared to the scout film prior to the CTs as noted above there are 5 non rib-bearing lumbar vertebra present in normal alignment. There is degenerative disc disease at L5-S1 where there is loss of disc space and sclerosis present. The remainder of the intervertebral disc spaces appear normal. No compression deformity is  seen. The SI joints are corticated.  IMPRESSION: Normal alignment of the lumbar vertebrae with degenerative disc disease at L5-S1.   Electronically Signed   By: Ivar Drape M.D.   On: 07/17/2014 16:25   Dg Spine Portable 1 View  07/20/2014   CLINICAL DATA:  Microdiskectomy and hemilaminectomy L4-5 on the left  EXAM: PORTABLE SPINE - 1 VIEW  COMPARISON:  Study obtained earlier in the day  FINDINGS: Cross-table lateral lumbar image labeled #3. Metallic probe tip overlies the posterior aspect of the L4-5 disc. There is marked disc space narrowing at L5-S1. No fracture or spondylolisthesis. There is moderate atherosclerotic calcification in the aorta.  IMPRESSION: Metallic probe overlies the posterior aspect of the L4-5 disc space. There is marked disc space narrowing at L5-S1, stable.   Electronically Signed   By: Lowella Grip III M.D.   On: 07/20/2014 14:55   Dg Spine Portable 1 View  07/20/2014   CLINICAL DATA:  Hemilaminectomy and microdiscectomy at L4-L5 on the left.  EXAM: PORTABLE SPINE - 1 VIEW  COMPARISON:  07/17/2014  FINDINGS: Posterior surgical marking at L4-L5. Again noted is disc space loss with endplate degenerative changes at L5-S1.  IMPRESSION: Surgical marking at L4-L5.   Electronically Signed   By: Markus Daft M.D.   On: 07/20/2014 14:35   Dg Spine Portable 1 View  07/20/2014   CLINICAL DATA:  Intraoperative localization film. Patient for L4-5 laminectomy.  EXAM: PORTABLE SPINE - 1 VIEW  COMPARISON:  Plain films lumbar spine 07/17/2014.  FINDINGS: Two probes are in place. The more superior is directed toward the superior aspect of the L4 pedicles. The more inferior probe is at the level of the L4-5 interspace.  IMPRESSION: Localization as above.   Electronically Signed   By: Inge Rise M.D.   On: 07/20/2014 14:05    EKG: Orders placed or performed in visit on 12/14/13  . EKG 12-Lead     Hospital Course: NATASIA SANKO is a 69 y.o. who was admitted to [facility]. They  were brought to the operating room on 07/20/2014 and underwent Procedure(s): centeral decompression,L4-L5, HEMI-LAMINECTOMY MICRODISCECTOMY L4 - L5 ON THE LEFT  LEVEL 1, forimatomy 4-5 root on the left.  Patient tolerated the procedure well and was later transferred to the recovery room and then to the orthopaedic floor for postoperative care.  They were given PO and IV analgesics for pain control following their surgery.  They were given 24 hours of postoperative antibiotics of  Anti-infectives    Start     Dose/Rate Route Frequency Ordered Stop   07/21/14 1000  valACYclovir (VALTREX) tablet 500 mg  Status:  Discontinued     500 mg Oral  Every morning - 10a 07/20/14 1641 07/21/14 1539   07/20/14 2200  ceFAZolin (ANCEF) IVPB 1 g/50 mL premix  Status:  Discontinued     1 g 100 mL/hr over 30 Minutes Intravenous 3 times per day 07/20/14 1641 07/21/14 1539   07/20/14 1433  polymyxin B 500,000 Units, bacitracin 50,000 Units in sodium chloride irrigation 0.9 % 500 mL irrigation  Status:  Discontinued       As needed 07/20/14 1433 07/20/14 1525   07/20/14 1128  ceFAZolin (ANCEF) IVPB 2 g/50 mL premix     2 g 100 mL/hr over 30 Minutes Intravenous On call to O.R. 07/20/14 1129 07/20/14 1333     and started on DVT prophylaxis in the form of Aspirin.   PT and OT were ordered.  Discharge planning consulted to help with postop disposition and equipment needs.  Patient had a good night on the evening of surgery.  Dressing was changed and the incision was clean and dry. Incision was healing well.  Patient was seen in rounds and was ready to go home.   Diet: Cardiac diet Activity:WBAT Follow-up:in 2 weeks Disposition - Home Discharged Condition: stable   Discharge Instructions    Call MD / Call 911    Complete by:  As directed   If you experience chest pain or shortness of breath, CALL 911 and be transported to the hospital emergency room.  If you develope a fever above  41 F, pus (white drainage) or  increased drainage or redness at the wound, or calf pain, call your surgeon's office.     Constipation Prevention    Complete by:  As directed   Drink plenty of fluids.  Prune juice may be helpful.  You may use a stool softener, such as Colace (over the counter) 100 mg twice a day.  Use MiraLax (over the counter) for constipation as needed.     Diet - low sodium heart healthy    Complete by:  As directed      Discharge instructions    Complete by:  As directed   Change your dressing daily. Shower only, no tub bath. Call if any temperatures greater than 101 or any wound complications: [phone] during the day and ask for Dr. Charlestine Night nurse, Brunilda Payor.     Increase activity slowly as tolerated    Complete by:  As directed      Lifting restrictions    Complete by:  As directed   No lifting            Medication List    STOP taking these medications        traMADol 50 MG tablet  Commonly known as:  ULTRAM      TAKE these medications        aspirin 81 MG tablet  Take 81 mg by mouth at bedtime.     beta carotene w/minerals tablet  Take 1 tablet by mouth at bedtime.     bisoprolol 5 MG tablet  Commonly known as:  ZEBETA  Take 1 tablet (5 mg total) by mouth daily.     cetirizine 10 MG tablet  Commonly known as:  ZYRTEC  Take 10 mg by mouth daily.     ELESTRIN 0.52 MG/0.87 GM (0.06%) Gel  Generic drug:  Estradiol  Place 1 application onto the skin every morning.     ezetimibe 10 MG tablet  Commonly known as:  ZETIA  Take 1 tablet (10 mg total) by mouth daily.     Fish Oil 1000 MG Caps  Take 1 capsule by mouth every morning. Fish oil $Remove'1200mg'tXBFBVX$  + Vitamin D     HYDROmorphone 1 MG/ML injection  Commonly known as:  DILAUDID  Inject 0.5-1 mLs (0.5-1 mg total) into the vein every 2 (two) hours as needed for severe pain.     HYDROmorphone 2 MG tablet  Commonly known as:  DILAUDID  Take 1-2 tablets (2-4 mg total) by mouth every 4 (four) hours as needed for moderate pain.       methocarbamol 500 MG tablet  Commonly known as:  ROBAXIN  Take 1 tablet (500 mg total) by mouth every 8 (eight) hours as needed for muscle spasms.     multivitamin tablet  Take 1 tablet by mouth every morning. Alive - 100 mg     progesterone 200 MG capsule  Commonly known as:  PROMETRIUM  Take 200 mg by mouth See admin instructions. One tablet daily for the first 12 days every four months.     tiotropium 18 MCG inhalation capsule  Commonly known as:  SPIRIVA  Place 18 mcg into inhaler and inhale every morning.     valACYclovir 1000 MG tablet  Commonly known as:  VALTREX  Take 500 mg by mouth every morning.     VENTOLIN HFA 108 (90 BASE) MCG/ACT inhaler  Generic drug:  albuterol  Inhale 1-2 puffs into the lungs every 4 (four) hours as  needed for wheezing or shortness of breath.           Follow-up Information    Follow up with GIOFFRE,RONALD A, MD. Schedule an appointment as soon as possible for a visit in 2 weeks.   Specialty:  Orthopedic Surgery   Contact information:   [address] [place] [address] [phone]       Signed: Ardeen Jourdain, PA-C Orthopaedic Surgery 07/24/2014, 1:50 PM

## 2014-09-27 ENCOUNTER — Ambulatory Visit: Payer: Self-pay

## 2014-09-27 ENCOUNTER — Other Ambulatory Visit: Payer: Self-pay

## 2014-10-08 ENCOUNTER — Ambulatory Visit
Admission: RE | Admit: 2014-10-08 | Discharge: 2014-10-08 | Disposition: A | Discharge status: Home / Self Care | Payer: Commercial Managed Care - HMO | Source: Ambulatory Visit | Attending: Gynecology | Admitting: Gynecology

## 2014-10-08 DIAGNOSIS — Z1231 Encounter for screening mammogram for malignant neoplasm of breast: Secondary | ICD-10-CM | POA: Diagnosis not present

## 2014-10-31 DIAGNOSIS — J441 Chronic obstructive pulmonary disease with (acute) exacerbation: Secondary | ICD-10-CM | POA: Diagnosis not present

## 2014-11-01 ENCOUNTER — Other Ambulatory Visit: Payer: Self-pay

## 2014-11-01 DIAGNOSIS — L218 Other seborrheic dermatitis: Secondary | ICD-10-CM | POA: Diagnosis not present

## 2014-11-01 DIAGNOSIS — L821 Other seborrheic keratosis: Secondary | ICD-10-CM | POA: Diagnosis not present

## 2014-11-01 DIAGNOSIS — Z85828 Personal history of other malignant neoplasm of skin: Secondary | ICD-10-CM | POA: Diagnosis not present

## 2014-11-02 ENCOUNTER — Other Ambulatory Visit: Payer: Self-pay | Admitting: Cardiovascular Disease

## 2014-11-07 ENCOUNTER — Ambulatory Visit
Admission: RE | Admit: 2014-11-07 | Discharge: 2014-11-07 | Disposition: A | Discharge status: Home / Self Care | Payer: Commercial Managed Care - HMO | Source: Ambulatory Visit | Attending: Gynecology | Admitting: Gynecology

## 2014-11-07 DIAGNOSIS — M85851 Other specified disorders of bone density and structure, right thigh: Secondary | ICD-10-CM | POA: Diagnosis not present

## 2014-11-07 DIAGNOSIS — M8580 Other specified disorders of bone density and structure, unspecified site: Secondary | ICD-10-CM

## 2014-11-08 ENCOUNTER — Other Ambulatory Visit: Payer: Self-pay

## 2014-11-13 DIAGNOSIS — Z23 Encounter for immunization: Secondary | ICD-10-CM | POA: Diagnosis not present

## 2014-11-23 DIAGNOSIS — M5442 Lumbago with sciatica, left side: Secondary | ICD-10-CM | POA: Diagnosis not present

## 2014-11-23 DIAGNOSIS — M5126 Other intervertebral disc displacement, lumbar region: Secondary | ICD-10-CM | POA: Diagnosis not present

## 2014-11-23 DIAGNOSIS — Z4789 Encounter for other orthopedic aftercare: Secondary | ICD-10-CM | POA: Diagnosis not present

## 2014-12-18 ENCOUNTER — Ambulatory Visit (INDEPENDENT_AMBULATORY_CARE_PROVIDER_SITE_OTHER): Payer: Commercial Managed Care - HMO | Admitting: Cardiovascular Disease

## 2014-12-18 ENCOUNTER — Encounter: Payer: Self-pay | Admitting: Cardiovascular Disease

## 2014-12-18 VITALS — BP 100/60 | HR 67 | Ht 65.75 in | Wt 170.0 lb

## 2014-12-18 DIAGNOSIS — R002 Palpitations: Secondary | ICD-10-CM

## 2014-12-18 DIAGNOSIS — I5032 Chronic diastolic (congestive) heart failure: Secondary | ICD-10-CM

## 2014-12-18 DIAGNOSIS — E7800 Pure hypercholesterolemia, unspecified: Secondary | ICD-10-CM | POA: Diagnosis not present

## 2014-12-18 NOTE — Progress Notes (Signed)
Joanne Rogers Date of Birth  1945/04/14 [facility] [address]    [place], [place]  [address] [phone]  Fax  [phone]  Problem List 1. Hyperlipidemia 2. COPD   History of Present Illness:  Joanne Rogers is a 69 y.o. female with a hx of dyspnea.  She had a normal myoview in 2007.  She has a hx of hyperliidemia.   She had recent lipids but they are not available at this time.  She has been diagnosed with COPD.  She denies any chest pain.  She had some chest wall / breast pain during there last mamogram on July.  She is exercising 2-3 times a week.  No CP or dyspnea.  Dec. 2, 0932:   Joanne Rogers is doing well.  Riding bike and exercising regularly.   Looking for a new job.  No CP or palps  Dec. 3, 6712: Joanne Rogers is doing ok.  No CP,  She has chronic shortness of breath. Has bad allergies. She has normal LV function by echo in 2007.    Dec. 6, 2016:  Doing ok from cardiac standpoint.  Has gained some weight.  Exercising regularly  breathin is ok   Current Outpatient Prescriptions on File Prior to Visit  Medication Sig Dispense Refill  . aspirin 81 MG tablet Take 81 mg by mouth at bedtime.     . beta carotene w/minerals (OCUVITE) tablet Take 1 tablet by mouth at bedtime.    . bisoprolol (ZEBETA) 5 MG tablet TAKE 1 TABLET  DAILY 90 tablet 1  . cetirizine (ZYRTEC) 10 MG tablet Take 10 mg by mouth daily.    . Estradiol (ELESTRIN) 0.52 MG/0.87 GM (0.06%) GEL Place 1 application onto the skin every morning.     Marland Kitchen HYDROmorphone (DILAUDID) 1 MG/ML injection Inject 0.5-1 mLs (0.5-1 mg total) into the vein every 2 (two) hours as needed for severe pain. 1 mL 0  . HYDROmorphone (DILAUDID) 2 MG tablet Take 1-2 tablets (2-4 mg total) by mouth every 4 (four) hours as needed for moderate pain. 30 tablet 0  . methocarbamol (ROBAXIN) 500 MG tablet Take 1 tablet (500 mg total) by mouth every 8 (eight) hours as needed for muscle spasms. 40 tablet 1  . Multiple Vitamin (MULTIVITAMIN)  tablet Take 1 tablet by mouth every morning. Alive - 100 mg    . Omega-3 Fatty Acids (FISH OIL) 1000 MG CAPS Take 1 capsule by mouth every morning. Fish oil '1200mg'$  + Vitamin D    . progesterone (PROMETRIUM) 200 MG capsule Take 200 mg by mouth See admin instructions. One tablet daily for the first 12 days every four months.    . tiotropium (SPIRIVA) 18 MCG inhalation capsule Place 18 mcg into inhaler and inhale every morning.    . valACYclovir (VALTREX) 1000 MG tablet Take 500 mg by mouth every morning.    . VENTOLIN HFA 108 (90 BASE) MCG/ACT inhaler Inhale 1-2 puffs into the lungs every 4 (four) hours as needed for wheezing or shortness of breath.   1  . ezetimibe (ZETIA) 10 MG tablet Take 1 tablet (10 mg total) by mouth daily. (Patient not taking: Reported on 12/18/2014) 90 tablet 3   No current facility-administered medications on file prior to visit.    Allergies  Allergen Reactions  . Codeine     REACTION: hives  . Crestor [Rosuvastatin Calcium]     Ache, depression.   . Lovastatin Other (See Comments)    Ache, depression.   Marland Kitchen  Simvastatin Other (See Comments)    Ache, depression.   . Statins Other (See Comments)    Muscle aches  . Latex Hives and Rash    Past Medical History  Diagnosis Date  . Hypercholesterolemia   . Dyspnea   . COPD (chronic obstructive pulmonary disease) ([place])   . Palpitations     "fluttering"  . Pneumonia     hx of  . H/O bronchitis     allergy related  . Arthritis     back, hands, neck  . DDD (degenerative disc disease), lumbar     Past Surgical History  Procedure Laterality Date  . Cardiovascular stress test  11/16/2005    EF 70%, NO ISCHEMIA  . US echocardiography  11/19/2005    EF 55-60%  . Carpal tunnel release Right ~1988  . Dilation and curettage of uterus      x3: x2 for miscarrages and x1 for polyp  . Hemi-microdiscectomy lumbar laminectomy level 1 Left 07/20/2014    Procedure: centeral decompression,L4-L5, HEMI-LAMINECTOMY  MICRODISCECTOMY L4 - L5 ON THE LEFT  LEVEL 1, forimatomy 4-5 root on the left;  Surgeon: Latanya Maudlin, MD;  Location: [facility];  Service: Orthopedics;  Laterality: Left;    History  Smoking status  . Former Smoker -- 0.50 packs/day for 25 years  . Types: Cigarettes  . Quit date: 05/16/2005  Smokeless tobacco  . Never Used    History  Alcohol Use  . Yes    Comment: social    Family History  Problem Relation Age of Onset  . Breast cancer Mother   . Emphysema Maternal Grandfather     Reviw of Systems:  Reviewed in the HPI.  All other systems are negative.  Physical Exam: BP 100/60 mmHg  Pulse 67  Ht 5' 5.75" (1.67 m)  Wt 170 lb (77.111 kg)  BMI 27.65 kg/m2 The patient is alert and oriented x 3.  The mood and affect are normal.   Skin: warm and dry.  Color is normal.    HEENT:   Normal carotids, no JVD, mucus membranes are normal.  Lungs: slight wheezing    Heart: RR    Abdomen: +BS, no HSM, non tender  Extremities:  On palpable cords, no c/c/e  Neuro:  Non focal, gait is normal    ECG: Dec. 6, 2016  NSR at 67.  Normal ecg.  Assessment / Plan:   1. Mild chronic diastolic congestive heart failure: She has normal left ventricle systolic motion. She's able to exercise on a regular basis. Continue current medications.  2. Hyperlipidemia: She's tried numerous statins without success. Would potentially like to try statins again and add coenzyme Q10. She'll have her lipids drawn again by Dr. Inda Merlin in 6 month. I offered to refer her to lipid clinic if she would like.  I'll see her again in one year.    Sufyan Meidinger, Wonda Cheng, MD  12/18/2014 2:21 PM    [address] [place],  [place], [place]  [address] Pager [phone] Phone: [phone]; Fax: [phone]   [facility]  [address] [place],   [address] [phone]   Fax [phone]

## 2014-12-18 NOTE — Patient Instructions (Signed)
Medication Instructions:  We are removing Zetia from your medication list   Labwork: None Ordered   Testing/Procedures: None Ordered  Follow-Up: Your physician wants you to follow-up in: 1 year with Dr. Acie Fredrickson.  You will receive a reminder letter in the mail two months in advance. If you don't receive a letter, please call our office to schedule the follow-up appointment.   If you need a refill on your cardiac medications before your next appointment, please call your pharmacy.   Thank you for choosing [facility]! Christen Bame, RN [phone]

## 2014-12-21 ENCOUNTER — Telehealth: Payer: Self-pay | Admitting: Cardiovascular Disease

## 2014-12-21 NOTE — Telephone Encounter (Signed)
New Message   Pt is calling for rn to talk about last office visit

## 2014-12-21 NOTE — Telephone Encounter (Signed)
Spoke with patient who states she is concerned about diagnosis of diastolic CHF seen on her AVS from office visit on 12/6.  She states she did not get an AVS from the 2015 office visit and that it was not documented as a diagnosis on her 2014 or 2013 paperwork.  She states she has been very upset and concerned about this diagnosis.  I reviewed the meaning of mild diastolic dysfunction and advised that it was originally diagnosed in 2007.  She states Dr. Acie Fredrickson has never talked with her about this diagnosis.  I apologized to her that this has not been explained and that she has been concerned about since the office visit.  I advised that I will discuss with Dr. Acie Fredrickson and that he will call her back.  She verbalized understanding.

## 2014-12-21 NOTE — Telephone Encounter (Signed)
This is just the technical term for her cardiac issue.  It is responsible for some of her shortness of breath. She also has COPD. Her cardiac status is stable

## 2014-12-27 DIAGNOSIS — J441 Chronic obstructive pulmonary disease with (acute) exacerbation: Secondary | ICD-10-CM | POA: Diagnosis not present

## 2015-01-11 DIAGNOSIS — J441 Chronic obstructive pulmonary disease with (acute) exacerbation: Secondary | ICD-10-CM | POA: Diagnosis not present

## 2015-01-16 DIAGNOSIS — J449 Chronic obstructive pulmonary disease, unspecified: Secondary | ICD-10-CM | POA: Diagnosis not present

## 2015-01-16 DIAGNOSIS — R05 Cough: Secondary | ICD-10-CM | POA: Diagnosis not present

## 2015-01-17 ENCOUNTER — Other Ambulatory Visit: Payer: Self-pay | Admitting: Family Medicine

## 2015-01-17 ENCOUNTER — Ambulatory Visit
Admission: RE | Admit: 2015-01-17 | Discharge: 2015-01-17 | Disposition: A | Discharge status: Home / Self Care | Payer: Commercial Managed Care - HMO | Source: Ambulatory Visit | Attending: Family Medicine | Admitting: Family Medicine

## 2015-01-17 DIAGNOSIS — J40 Bronchitis, not specified as acute or chronic: Secondary | ICD-10-CM | POA: Diagnosis not present

## 2015-01-17 DIAGNOSIS — R05 Cough: Secondary | ICD-10-CM

## 2015-01-17 DIAGNOSIS — R059 Cough, unspecified: Secondary | ICD-10-CM

## 2015-02-11 DIAGNOSIS — L309 Dermatitis, unspecified: Secondary | ICD-10-CM | POA: Diagnosis not present

## 2015-02-11 DIAGNOSIS — L821 Other seborrheic keratosis: Secondary | ICD-10-CM | POA: Diagnosis not present

## 2015-02-11 DIAGNOSIS — D1801 Hemangioma of skin and subcutaneous tissue: Secondary | ICD-10-CM | POA: Diagnosis not present

## 2015-02-11 DIAGNOSIS — L820 Inflamed seborrheic keratosis: Secondary | ICD-10-CM | POA: Diagnosis not present

## 2015-02-11 DIAGNOSIS — Z85828 Personal history of other malignant neoplasm of skin: Secondary | ICD-10-CM | POA: Diagnosis not present

## 2015-02-19 ENCOUNTER — Encounter: Payer: Self-pay | Admitting: Neurology

## 2015-02-19 ENCOUNTER — Ambulatory Visit (INDEPENDENT_AMBULATORY_CARE_PROVIDER_SITE_OTHER): Payer: Commercial Managed Care - HMO | Admitting: Neurology

## 2015-02-19 VITALS — BP 118/70 | HR 88 | Resp 20 | Ht 66.0 in | Wt 168.0 lb

## 2015-02-19 DIAGNOSIS — E669 Obesity, unspecified: Secondary | ICD-10-CM

## 2015-02-19 DIAGNOSIS — R0683 Snoring: Secondary | ICD-10-CM | POA: Diagnosis not present

## 2015-02-19 DIAGNOSIS — J441 Chronic obstructive pulmonary disease with (acute) exacerbation: Secondary | ICD-10-CM | POA: Diagnosis not present

## 2015-02-19 NOTE — Progress Notes (Signed)
SLEEP MEDICINE CLINIC   Provider:  Larey Seat, M D  Referring Provider:   Dentist   Primary Care Physician:  Marjorie Smolder, MD  Chief Complaint  Patient presents with  . New Patient (Initial Visit)    snoring, referral for [employer], rm 11    HPI:  Joanne Rogers is a 70 y.o. female , seen here as a referral from [facility] .  Chief complaint according to patient : "snoring" according to her daughter.       [name] states that her daughter has been concerned about her sleep breathing having witnessed apneas and snoring. She wakes at night sometimes due to her snoring, gasping for air or choking. She states she prefers to sleep on her side, but finds herself on the side when she wakes up as well. She does not sleep supine. Some nights she will have to have a bathroom break but other nights not. When the patient presented to her dentist, Dr. Chevis Pretty, a questionnaire was given to her but identified her as having higher risk of sleep apnea. She was recently treated for bronchitis and was taking steroids in January . She has asthma and COPD. Was a smoker for 25 years.   Sleep habits are as follows: the patient's bedtime is 11.30 , she goes to sleep promptly . She sleeps on one pillow. She reports some nights that she is simple busy and stays up longer. She sleeps with the TV on in the bedroom.  She sleeps through the night and wakes usually spontaneously at 7 AM and goes to work. If she has not to go to work , she can sleep until 8.30.  She reports no dry mouth, headaches and no TMJ pain, but  has arthritis pain to arms and shoulders and in the neck spine.  Daytime fatigue and sleepiness, Epworth 12. 3 times a week she will nap. Sleep medical history and family sleep history: father had hypersomnia.  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 Has one daughter. Lives alone. No tobacco and no ETOH use, caffeine:   Review of  Systems: Out of a complete 14 system review, the patient complains of only the following symptoms, and all other reviewed systems are negative. Epworth 12     Social History   Social History  . Marital Status: Widowed    Spouse Name: N/A  . Number of Children: N/A  . Years of Education: N/A   Occupational History  . caregiver    Social History Main Topics  . Smoking status: Former Smoker -- 0.50 packs/day for 25 years    Types: Cigarettes    Quit date: 05/16/2005  . Smokeless tobacco: Never Used  . Alcohol Use: 3.6 oz/week    6 Standard drinks or equivalent per week     Comment: social  . Drug Use: No  . Sexual Activity: Not on file   Other Topics Concern  . Not on file   Social History Narrative   Children   Lives alone   Denies caffeine use.    Family History  Problem Relation Age of Onset  . Breast cancer Mother   . Emphysema Maternal Grandfather     Past Medical History  Diagnosis Date  . Hypercholesterolemia   . Dyspnea   . COPD (chronic obstructive pulmonary disease) ([place])   . Palpitations     "fluttering"  . Pneumonia     hx of  .  H/O bronchitis     allergy related  . Arthritis     back, hands, neck  . DDD (degenerative disc disease), lumbar     Past Surgical History  Procedure Laterality Date  . Cardiovascular stress test  11/16/2005    EF 70%, NO ISCHEMIA  . US echocardiography  11/19/2005    EF 55-60%  . Carpal tunnel release Right ~1988  . Dilation and curettage of uterus      x3: x2 for miscarrages and x1 for polyp  . Hemi-microdiscectomy lumbar laminectomy level 1 Left 07/20/2014    Procedure: centeral decompression,L4-L5, HEMI-LAMINECTOMY MICRODISCECTOMY L4 - L5 ON THE LEFT  LEVEL 1, forimatomy 4-5 root on the left;  Surgeon: Latanya Maudlin, MD;  Location: [facility];  Service: Orthopedics;  Laterality: Left;    Current Outpatient Prescriptions  Medication Sig Dispense Refill  . aspirin 81 MG tablet Take 81 mg by mouth at bedtime.     .  beta carotene w/minerals (OCUVITE) tablet Take 1 tablet by mouth at bedtime.    . bisoprolol (ZEBETA) 5 MG tablet TAKE 1 TABLET  DAILY 90 tablet 1  . cetirizine (ZYRTEC) 10 MG tablet Take 10 mg by mouth daily.    . Cholecalciferol (VITAMIN D3) 1000 units CAPS Take 1,000 Units by mouth 2 (two) times daily.    . Estradiol (ELESTRIN) 0.52 MG/0.87 GM (0.06%) GEL Place 1 application onto the skin every morning.     . Multiple Vitamin (MULTIVITAMIN) tablet Take 1 tablet by mouth every morning. Alive - 100 mg    . Omega-3 Fatty Acids (FISH OIL) 1000 MG CAPS Take 1 capsule by mouth every morning. Fish oil '1200mg'$  + Vitamin D    . progesterone (PROMETRIUM) 200 MG capsule Take 200 mg by mouth See admin instructions. One tablet daily for the first 12 days every four months.    . tiotropium (SPIRIVA) 18 MCG inhalation capsule Place 18 mcg into inhaler and inhale every morning.    . valACYclovir (VALTREX) 1000 MG tablet Take 500 mg by mouth every morning.    . VENTOLIN HFA 108 (90 BASE) MCG/ACT inhaler Inhale 1-2 puffs into the lungs every 4 (four) hours as needed for wheezing or shortness of breath.   1   No current facility-administered medications for this visit.    Allergies as of 02/19/2015 - Review Complete 02/19/2015  Allergen Reaction Noted  . Codeine    . Crestor [rosuvastatin calcium]  11/19/2010  . Lovastatin Other (See Comments) 11/19/2010  . Simvastatin Other (See Comments) 11/19/2010  . Statins Other (See Comments) 07/17/2014  . Latex Hives and Rash 07/09/2014    Vitals: BP 118/70 mmHg  Pulse 88  Resp 20  Ht '5\' 6"'$  (1.676 m)  Wt 168 lb (76.204 kg)  BMI 27.13 kg/m2 Last Weight:  Wt Readings from Last 1 Encounters:  02/19/15 168 lb (76.204 kg)   MWU:XLKG mass index is 27.13 kg/(m^2).     Last Height:   Ht Readings from Last 1 Encounters:  02/19/15 '5\' 6"'$  (1.676 m)    Physical exam:  General: The patient is awake, alert and appears not in acute distress. The patient is well  groomed. Head: Normocephalic, atraumatic. Neck is supple. Mallampati 3,  neck circumference: 16 . Nasal airflow unrestricted , Retrognathia is not seen.  Cardiovascular:  Regular rate and rhythm , without  murmurs or carotid bruit, and without distended neck veins. Respiratory:wheezing at the bronchial level. clear to auscultation. Skin:  Without evidence of edema, or  rash Trunk: intact  Neurologic exam : The patient is awake and alert, oriented to place and time.   Attention span & concentration ability appears normal.  Speech is fluent, without dysarthria, dysphonia or aphasia.  Mood and affect are appropriate.  Cranial nerves: Pupils are equal and briskly reactive to light. Extraocular movements  in vertical and horizontal planes intact and without nystagmus. Hearing to finger rub intact. Facial sensation intact to fine touch.Facial motor strength is symmetric and tongue and uvula move midline. Shoulder shrug was symmetrical.  Motor exam: Normal tone, muscle bulk and symmetric strength in all extremities. Sensory:  Fine touch, pinprick and vibration were tested in all extremities. Proprioception tested in the upper extremities was normal. Coordination: Rapid alternating movements in the fingers/hands was normal. Finger-to-nose maneuver  normal without evidence of ataxia, dysmetria or tremor. Gait and station: Patient walks without assistive device and is able unassisted to climb up to the exam table. Strength within normal limits. ties are symmetric and intact.  The patient was advised of the nature of the diagnosed sleep disorder , the treatment options and risks for general a health and wellness arising from not treating the condition.   I spent more than 30 minutes of face to face time with the patient. Greater than 50% of time was spent in counseling and coordination of care. We have discussed the diagnosis and differential and I answered the patient's questions.     Assessment:   After physical and neurologic examination, review of laboratory studies,  Personal review of imaging studies, reports of other /same  Imaging studies ,  Results of polysomnography/ neurophysiology testing and pre-existing records as far as provided in visit., my assessment is   1) snoring and possible apnea in a patient with COPD and recently treated for bronchitis. Will order a SPLIT night polysomnography.  If the patient has mild apnea and loud snoring, but not hypoxemia, a dental device will be ordered through [facility].    2) COPD  Plan:  Treatment plan and additional workup :  SPLIT, RV after test    Larey Seat MD  02/19/2015  CC courtney davis, DDS  CC: Darcus Austin, Md [address] [place], [place] [address]

## 2015-02-19 NOTE — Patient Instructions (Signed)

## 2015-03-21 ENCOUNTER — Other Ambulatory Visit: Payer: Self-pay | Admitting: Cardiovascular Disease

## 2015-03-21 ENCOUNTER — Ambulatory Visit (INDEPENDENT_AMBULATORY_CARE_PROVIDER_SITE_OTHER): Payer: Commercial Managed Care - HMO | Admitting: Neurology

## 2015-03-21 DIAGNOSIS — E669 Obesity, unspecified: Secondary | ICD-10-CM

## 2015-03-21 DIAGNOSIS — R0683 Snoring: Secondary | ICD-10-CM

## 2015-03-21 DIAGNOSIS — G4730 Sleep apnea, unspecified: Secondary | ICD-10-CM | POA: Diagnosis not present

## 2015-03-21 DIAGNOSIS — J441 Chronic obstructive pulmonary disease with (acute) exacerbation: Secondary | ICD-10-CM

## 2015-03-21 NOTE — Sleep Study (Signed)
Please see the scanned sleep study interpretation located in the procedure tab in the chart view section.  

## 2015-03-22 NOTE — Sleep Study (Signed)
Please see the scanned sleep study interpretation located in the procedure tab in the chart view section.  

## 2015-03-28 DIAGNOSIS — J309 Allergic rhinitis, unspecified: Secondary | ICD-10-CM | POA: Diagnosis not present

## 2015-03-28 DIAGNOSIS — J441 Chronic obstructive pulmonary disease with (acute) exacerbation: Secondary | ICD-10-CM | POA: Diagnosis not present

## 2015-04-01 ENCOUNTER — Telehealth: Payer: Self-pay

## 2015-04-01 DIAGNOSIS — G4733 Obstructive sleep apnea (adult) (pediatric): Secondary | ICD-10-CM

## 2015-04-01 NOTE — Telephone Encounter (Signed)
I called and spoke with pt regarding her sleep study results. I advised her that her study revealed hypoxemia, likely in the setting of COPD and only mild osa, but treatment is advised. PAP therapy is indicated for mild apnea if associated with hypoxemia. Dr. Brett Fairy recommends proceedingwith a CPAP (and oxygen titration) study to optimize therapy. Alternative therapies for snoring and mild apnea such as an oral appliance or an ENT procedure, do not address hypoxemia component. I advised pt to sleep on her side, and that weight loss may be entertained. I advised pt to avoid sedative-hypnotics which may worsen sleep apnea, as well asa alcohol and tobacco. I advised her to lose weight, diet, and exercise if not contraindicated by her other physicians. Pt is agreeable to proceeding with cpap titration as long as insurance covers it. I advised her that our sleep lab will know of her insurance covers it when they call her to get the study scheduled. Pt verbalized understanding.

## 2015-05-08 DIAGNOSIS — B372 Candidiasis of skin and nail: Secondary | ICD-10-CM | POA: Diagnosis not present

## 2015-05-08 DIAGNOSIS — Z780 Asymptomatic menopausal state: Secondary | ICD-10-CM | POA: Diagnosis not present

## 2015-05-08 DIAGNOSIS — Z79890 Hormone replacement therapy (postmenopausal): Secondary | ICD-10-CM | POA: Diagnosis not present

## 2015-05-29 ENCOUNTER — Telehealth: Payer: Self-pay | Admitting: Neurology

## 2015-05-29 DIAGNOSIS — G4733 Obstructive sleep apnea (adult) (pediatric): Secondary | ICD-10-CM

## 2015-05-29 NOTE — Telephone Encounter (Signed)
CPAP titration with possible oxygen titration order placed.

## 2015-05-29 NOTE — Telephone Encounter (Signed)
Joanne Rogers is ready to schedule however the order is gone.  Can I get an order for CPAP?

## 2015-07-03 ENCOUNTER — Ambulatory Visit: Payer: Commercial Managed Care - HMO | Admitting: Neurology

## 2015-07-03 DIAGNOSIS — G4733 Obstructive sleep apnea (adult) (pediatric): Secondary | ICD-10-CM

## 2015-07-04 DIAGNOSIS — J441 Chronic obstructive pulmonary disease with (acute) exacerbation: Secondary | ICD-10-CM | POA: Diagnosis not present

## 2015-07-11 ENCOUNTER — Ambulatory Visit
Admission: RE | Admit: 2015-07-11 | Discharge: 2015-07-11 | Disposition: A | Discharge status: Home / Self Care | Payer: Commercial Managed Care - HMO | Source: Ambulatory Visit | Attending: Family Medicine | Admitting: Family Medicine

## 2015-07-11 ENCOUNTER — Other Ambulatory Visit: Payer: Self-pay | Admitting: Family Medicine

## 2015-07-11 DIAGNOSIS — I5032 Chronic diastolic (congestive) heart failure: Secondary | ICD-10-CM | POA: Diagnosis not present

## 2015-07-11 DIAGNOSIS — J449 Chronic obstructive pulmonary disease, unspecified: Secondary | ICD-10-CM | POA: Diagnosis not present

## 2015-07-11 DIAGNOSIS — Z131 Encounter for screening for diabetes mellitus: Secondary | ICD-10-CM | POA: Diagnosis not present

## 2015-07-11 DIAGNOSIS — R0602 Shortness of breath: Secondary | ICD-10-CM | POA: Diagnosis not present

## 2015-07-11 DIAGNOSIS — Z0000 Encounter for general adult medical examination without abnormal findings: Secondary | ICD-10-CM | POA: Diagnosis not present

## 2015-07-11 DIAGNOSIS — J441 Chronic obstructive pulmonary disease with (acute) exacerbation: Secondary | ICD-10-CM

## 2015-07-11 DIAGNOSIS — H6121 Impacted cerumen, right ear: Secondary | ICD-10-CM | POA: Diagnosis not present

## 2015-07-11 DIAGNOSIS — E7800 Pure hypercholesterolemia, unspecified: Secondary | ICD-10-CM | POA: Diagnosis not present

## 2015-07-11 DIAGNOSIS — R05 Cough: Secondary | ICD-10-CM | POA: Diagnosis not present

## 2015-07-18 ENCOUNTER — Encounter: Payer: Self-pay | Admitting: Internal Medicine

## 2015-07-18 ENCOUNTER — Encounter: Payer: Self-pay | Admitting: Cardiovascular Disease

## 2015-07-18 ENCOUNTER — Ambulatory Visit (INDEPENDENT_AMBULATORY_CARE_PROVIDER_SITE_OTHER): Payer: Commercial Managed Care - HMO | Admitting: Internal Medicine

## 2015-07-18 VITALS — BP 134/56 | HR 80 | Temp 98.6°F | Ht 66.0 in | Wt 168.8 lb

## 2015-07-18 DIAGNOSIS — J9612 Chronic respiratory failure with hypercapnia: Secondary | ICD-10-CM

## 2015-07-18 DIAGNOSIS — J449 Chronic obstructive pulmonary disease, unspecified: Secondary | ICD-10-CM | POA: Diagnosis not present

## 2015-07-18 DIAGNOSIS — J9611 Chronic respiratory failure with hypoxia: Secondary | ICD-10-CM | POA: Insufficient documentation

## 2015-07-18 NOTE — Assessment & Plan Note (Addendum)
-    quit smoking 2007 -  HC03 level 43 05/2009  -  PFT's 11/29/09 FEV1 0.77 (29%) with ratio 46 and 12% p B2 and DLCO 57  -  HC03  33  07/17/14  - 07/18/2015  After extensive coaching HFA effectiveness =    75% > change to spiriva respimat   DDX of  difficult airways management almost all start with A and  include Adherence, Ace Inhibitors, Acid Reflux, Active Sinus Disease, Alpha 1 Antitripsin deficiency, Anxiety masquerading as Airways dz,  ABPA,  Allergy(esp in young), Aspiration (esp in elderly), Adverse effects of meds,  Active smokers, A bunch of PE's (a small clot burden can't cause this syndrome unless there is already severe underlying pulm or vascular dz with poor reserve) plus two Bs  = Bronchiectasis and Beta blocker use..and one C= CHF  Adherence is always the initial "prime suspect" and is a multilayered concern that requires a "trust but verify" approach in every patient - starting with knowing how to use medications, especially inhalers, correctly, keeping up with refills and understanding the fundamental difference between maintenance and prns vs those medications only taken for a very short course and then stopped and not refilled.  - The proper method of use, as well as anticipated side effects, of a metered-dose inhaler are discussed and demonstrated to the patient. Improved effectiveness after extensive coaching during this visit to a level of approximately 75 % from a baseline of 50 % > change from dpi/hfa to hfa/respimat = symbicort 160 / spiriva respimat  ? Adverse effects of dpi > d/c spiriva dpi  ? Allergy/asthmatic component suggested by hx of needing pred repeatedly to get back to baseline > may need a high floor of prednsione.  The goal with a chronic steroid dependent illness is always arriving at the lowest effective dose that controls the disease/symptoms and not accepting a set "formula" which is based on statistics or guidelines that don't always take into account patient   variability or the natural hx of the dz in every individual patient, which may well vary over time.  For now therefore I recommend the patient maintain  Off chronic steroids if possible may consider daliresp trial next though obviously not for allergic component   > BB effect > very unlikely on zebeta, one of the most beta selective BB on market  ? Chf/ note h/o diastolic dysfunction > no evidence of chf clinically at present   Total time devoted to counseling  = 35/73mreview case with pt/ discussion of options/alternatives/ personally creating written instructions  in presence of pt  then going over those specific  Instructions directly with the pt including how to use all of the meds but in particular covering each new medication in detail and the difference between the maintenance/automatic meds and the prns using an action plan format for the later and bring back for pfts first.

## 2015-07-18 NOTE — Patient Instructions (Signed)
Plan A = Automatic = Symbicort 160 Take 2 puffs first thing in am and then another 2 puffs about 12 hours later and spiriva respimat 2 each am    Plan B = Backup Only use your albuterol as a rescue medication to be used if you can't catch your breath by resting or doing a relaxed purse lip breathing pattern.  - The less you use it, the better it will work when you need it. - Ok to use the inhaler up to 2 puffs  every 4 hours if you must but call for appointment if use goes up over your usual need - Don't leave home without it !!  (think of it like the spare tire for your car)   Plan C = Crisis - only use your albuterol nebulizer if you first try Plan B and it fails to help > ok to use the nebulizer up to every 4 hours but if start needing it regularly call for immediate appointment   Please schedule a follow up office visit in  2 weeks, sooner if needed with pfts at [facility] first

## 2015-07-18 NOTE — Progress Notes (Signed)
Subjective:    Patient ID: Joanne Rogers, female    DOB: April 10, 1945,    MRN: [id number]  HPI  34 yowf quit smoking 2007  With GOLD IV criteria 11/29/09 had been ok on spiriva but gradually worse since 2014   and some better since symbicort added fall 2016 referred to pulmonary clinic 07/18/2015 by Dr Inda Merlin for freq aecopd.   07/18/2015 1st [facility] office visit/ Joanne Rogers  rx spiriva dip/ symbicort hfa  Chief Complaint  Patient presents with  . PULMONARY CONSULT    Referred by Dr. Darcus Austin. Pt has not been seen since 2012 for COPD. Pt reports recent COPD flare that was treated with oral prednisone. Pt has improved. Pt c/o current SOB/wheeze and occasional CP/tightness. Pt does use albuterol HFA with some improvement in symptoms.   avg alb 2 x daily  MMRC2 = can't walk a nl pace on a flat grade s sob but does fine slow and flat ie indoor walking/shopping  No obvious day to day or daytime variability or assoc excess/ purulent sputum or mucus plugs or hemoptysis or  Classically exertional or pleuritic cp   or overt sinus or hb symptoms. No unusual exp hx or h/o childhood pna/ asthma or knowledge of premature birth.  Sleeping ok without nocturnal  or early am exacerbation  of respiratory  c/o's or need for noct saba. Also denies any obvious fluctuation of symptoms with weather or environmental changes or other aggravating or alleviating factors except as outlined above   Current Medications, Allergies, Complete Past Medical History, Past Surgical History, Family History, and Social History were reviewed in [employer] record.        Review of Systems  Constitutional: Negative.  Negative for fever and unexpected weight change.  HENT: Positive for congestion and sinus pressure. Negative for dental problem, ear pain, nosebleeds, postnasal drip, rhinorrhea, sneezing, sore throat and trouble swallowing.   Eyes: Negative.  Negative for redness and itching.    Respiratory: Positive for cough, shortness of breath and wheezing. Negative for chest tightness.   Cardiovascular: Negative.  Negative for palpitations and leg swelling.  Gastrointestinal: Negative.  Negative for nausea and vomiting.  Endocrine: Negative.   Genitourinary: Negative.  Negative for dysuria.  Musculoskeletal: Positive for joint swelling and arthralgias.  Skin: Negative.  Negative for rash.  Allergic/Immunologic: Negative.   Neurological: Positive for headaches.  Hematological: Negative.  Does not bruise/bleed easily.  Psychiatric/Behavioral: Negative.  Negative for dysphoric mood. The patient is not nervous/anxious.        Objective:   Physical Exam  amb wf nad  Wt Readings from Last 3 Encounters:  07/18/15 168 lb 12.8 oz (76.567 kg)  02/19/15 168 lb (76.204 kg)  12/18/14 170 lb (77.111 kg)    Vital signs reviewed    HEENT: nl dentition, turbinates, and oropharynx. Nl external ear canals without cough reflex   NECK :  without JVD/Nodes/TM/ nl carotid upstrokes bilaterally   LUNGS: no acc muscle use, hyperinflated chest  With distant bs bs/ no wheeze  CV:  RRR  no s3 or murmur or increase in P2, no edema   ABD:  soft and nontender with nl inspiratory excursion in the supine position. No bruits or organomegaly, bowel sounds nl  MS:  Nl gait/ ext warm without deformities, calf tenderness, cyanosis or clubbing No obvious joint restrictions   SKIN: warm and dry without lesions    NEURO:  alert, approp, nl sensorium with  no motor  deficits      I personally reviewed images and agree with radiology impression as follows:  CXR:  07/10/25  No active cardiopulmonary disease      Assessment & Plan:

## 2015-07-18 NOTE — Assessment & Plan Note (Signed)
HC03   07/17/2014  = 33   Though somewhat paradoxic, when the lung fails to clear C02 properly and pC02 rises the lung then becomes a more efficient scavenger of C02 allowing lower work of breathing and  better C02 clearance albeit at a higher serum pC02 level - this is why pts can look a lot better than their ABG's would suggest and why it's so difficult to prognosticate endstage dz.  It's also why I strongly rec DNI status (ventilating pts down to a nl pC02 adversely affects this compensatory mechanism)

## 2015-07-22 ENCOUNTER — Ambulatory Visit (HOSPITAL_COMMUNITY)
Admission: RE | Admit: 2015-07-22 | Discharge: 2015-07-22 | Disposition: A | Discharge status: Home / Self Care | Payer: Commercial Managed Care - HMO | Source: Ambulatory Visit | Attending: Internal Medicine | Admitting: Internal Medicine

## 2015-07-22 DIAGNOSIS — J449 Chronic obstructive pulmonary disease, unspecified: Secondary | ICD-10-CM | POA: Diagnosis not present

## 2015-07-22 LAB — PULMONARY FUNCTION TEST
DL/VA % pred: 76 %
DL/VA: 3.84 ml/min/mmHg/L
DLCO unc % pred: 43 %
DLCO unc: 11.82 ml/min/mmHg
FEF 25-75 Post: 0.57 L/sec
FEF 25-75 Pre: 0.37 L/sec
FEF2575-%Change-Post: 52 %
FEF2575-%Pred-Post: 29 %
FEF2575-%Pred-Pre: 19 %
FEV1-%Change-Post: 16 %
FEV1-%Pred-Post: 49 %
FEV1-%Pred-Pre: 42 %
FEV1-Post: 1.18 L
FEV1-Pre: 1.01 L
FEV1FVC-%Change-Post: 8 %
FEV1FVC-%Pred-Pre: 69 %
FEV6-%Change-Post: 9 %
FEV6-%Pred-Post: 64 %
FEV6-%Pred-Pre: 58 %
FEV6-Post: 1.96 L
FEV6-Pre: 1.78 L
FEV6FVC-%Change-Post: 2 %
FEV6FVC-%Pred-Post: 100 %
FEV6FVC-%Pred-Pre: 97 %
FVC-%Change-Post: 6 %
FVC-%Pred-Post: 64 %
FVC-%Pred-Pre: 60 %
FVC-Post: 2.04 L
FVC-Pre: 1.9 L
Post FEV1/FVC ratio: 58 %
Post FEV6/FVC ratio: 96 %
Pre FEV1/FVC ratio: 53 %
Pre FEV6/FVC Ratio: 94 %
RV % pred: 130 %
RV: 3.05 L
TLC % pred: 93 %
TLC: 5.02 L

## 2015-07-22 MED ORDER — ALBUTEROL SULFATE (2.5 MG/3ML) 0.083% IN NEBU
2.5000 mg | INHALATION_SOLUTION | Freq: Once | RESPIRATORY_TRACT | Status: AC
  Administered 2015-07-22: 2.5 mg via RESPIRATORY_TRACT

## 2015-07-30 ENCOUNTER — Telehealth: Payer: Self-pay | Admitting: Internal Medicine

## 2015-07-30 ENCOUNTER — Ambulatory Visit (INDEPENDENT_AMBULATORY_CARE_PROVIDER_SITE_OTHER): Payer: Commercial Managed Care - HMO | Admitting: Neurology

## 2015-07-30 DIAGNOSIS — G4733 Obstructive sleep apnea (adult) (pediatric): Secondary | ICD-10-CM | POA: Diagnosis not present

## 2015-07-30 DIAGNOSIS — G4710 Hypersomnia, unspecified: Secondary | ICD-10-CM

## 2015-07-30 DIAGNOSIS — G4730 Sleep apnea, unspecified: Principal | ICD-10-CM

## 2015-07-30 MED ORDER — TIOTROPIUM BROMIDE MONOHYDRATE 1.25 MCG/ACT IN AERS: 1.0000 | INHALATION_SPRAY | Freq: Every day | RESPIRATORY_TRACT | Status: DC

## 2015-07-30 NOTE — Telephone Encounter (Signed)
Spoke with pt and she states that Spiriva Respimat is working well and she would like another sample. Advised pt that we can provide another sample. Left at front desk. Nothing further needed.

## 2015-08-05 NOTE — Progress Notes (Signed)
Spoke with pt and notified of results per Dr. Wert. Pt verbalized understanding and denied any questions. 

## 2015-08-08 ENCOUNTER — Encounter: Payer: Self-pay | Admitting: Internal Medicine

## 2015-08-08 ENCOUNTER — Ambulatory Visit (INDEPENDENT_AMBULATORY_CARE_PROVIDER_SITE_OTHER): Payer: Commercial Managed Care - HMO | Admitting: Internal Medicine

## 2015-08-08 VITALS — BP 122/68 | HR 67 | Ht 66.5 in | Wt 168.0 lb

## 2015-08-08 DIAGNOSIS — J449 Chronic obstructive pulmonary disease, unspecified: Secondary | ICD-10-CM

## 2015-08-08 DIAGNOSIS — J9612 Chronic respiratory failure with hypercapnia: Secondary | ICD-10-CM | POA: Diagnosis not present

## 2015-08-08 NOTE — Patient Instructions (Addendum)
No change in medications but Work on inhaler technique:  relax and gently blow all the way out then take a nice smooth deep breath back in, triggering the inhaler at same time you start breathing in.  Hold for up to 5 seconds if you can. Blow out thru nose. Rinse and gargle with water when done    Call me if you want to do pulmonary rehab   If you are satisfied with your treatment plan,  let your doctor know and he/she can either refill your medications or you can return here when your prescription runs out.     If in any way you are not 100% satisfied,  please tell us.  If 100% better, tell your friends!  Pulmonary follow up is as needed  - either I can see you or my NP for next flare

## 2015-08-08 NOTE — Progress Notes (Signed)
Subjective:    Patient ID: Joanne Rogers, female    DOB: December 10, 1945     MRN: [id number]    Brief patient profile:  79 yowf quit smoking 2007  With GOLD IV criteria 11/29/09 had been ok on spiriva but gradually worse since 2014   and some better since symbicort added fall 2016 referred to pulmonary clinic 07/18/2015 by Dr Inda Merlin for freq aecopd.   07/18/2015 1st [facility] office visit/ Aliscia Clayton  rx spiriva dip/ symbicort hfa  Chief Complaint  Patient presents with  . PULMONARY CONSULT    Referred by Dr. Darcus Austin. Pt has not been seen since 2012 for COPD. Pt reports recent COPD flare that was treated with oral prednisone. Pt has improved. Pt c/o current SOB/wheeze and occasional CP/tightness. Pt does use albuterol HFA with some improvement in symptoms.   avg alb 2 x daily  MMRC2 = can't walk a nl pace on a flat grade s sob but does fine slow and flat ie indoor walking/shopping rec Plan A = Automatic = Symbicort 160 Take 2 puffs first thing in am and then another 2 puffs about 12 hours later and spiriva respimat 2 each am  Plan B = Backup Only use your albuterol as a rescue medication   Plan C = Crisis - only use your albuterol nebulizer if you first try Plan B     08/08/2015  f/u ov/Hamid Brookens re:  GOLD II/III  symbicort 160 / spriva no need for saba   Doe = MMRC1 = can walk nl pace, flat grade, can't hurry or go uphills or steps s sob    No obvious day to day or daytime variability or assoc excess/ purulent sputum or mucus plugs or hemoptysis or cp or chest tightness, subjective wheeze or overt sinus or hb symptoms. No unusual exp hx or h/o childhood pna/ asthma or knowledge of premature birth.  Sleeping ok without nocturnal  or early am exacerbation  of respiratory  c/o's or need for noct saba. Also denies any obvious fluctuation of symptoms with weather or environmental changes or other aggravating or alleviating factors except as outlined above   Current Medications, Allergies,  Complete Past Medical History, Past Surgical History, Family History, and Social History were reviewed in [employer] record.  ROS  The following are not active complaints unless bolded sore throat, dysphagia, dental problems, itching, sneezing,  nasal congestion or excess/ purulent secretions, ear ache,   fever, chills, sweats, unintended wt loss, classically pleuritic or exertional cp,  orthopnea pnd or leg swelling, presyncope, palpitations, abdominal pain, anorexia, nausea, vomiting, diarrhea  or change in bowel or bladder habits, change in stools or urine, dysuria,hematuria,  rash, arthralgias, visual complaints, headache, numbness, weakness or ataxia or problems with walking or coordination,  change in mood/affect or memory.               .         Objective:   Physical Exam  amb wf nad  08/08/2015        168   07/18/15 168 lb 12.8 oz (76.567 kg)  02/19/15 168 lb (76.204 kg)  12/18/14 170 lb (77.111 kg)    Vital signs reviewed    HEENT: nl dentition, turbinates, and oropharynx. Nl external ear canals without cough reflex   NECK :  without JVD/Nodes/TM/ nl carotid upstrokes bilaterally   LUNGS: no acc muscle use, hyperinflated chest  With distant bs bs/ no wheeze  CV:  RRR  no s3 or murmur or increase in P2, no edema   ABD:  soft and nontender with nl inspiratory excursion in the supine position. No bruits or organomegaly, bowel sounds nl  MS:  Nl gait/ ext warm without deformities, calf tenderness, cyanosis or clubbing No obvious joint restrictions   SKIN: warm and dry without lesions    NEURO:  alert, approp, nl sensorium with  no motor deficits      I personally reviewed images and agree with radiology impression as follows:  CXR:  07/10/25  No active cardiopulmonary disease      Assessment & Plan:

## 2015-08-09 ENCOUNTER — Ambulatory Visit: Payer: Commercial Managed Care - HMO | Admitting: Internal Medicine

## 2015-08-12 ENCOUNTER — Telehealth: Payer: Self-pay

## 2015-08-12 ENCOUNTER — Encounter: Payer: Self-pay | Admitting: Internal Medicine

## 2015-08-12 NOTE — Assessment & Plan Note (Addendum)
-    quit smoking 2007 -  HC03 level 43 05/2009  -  PFT's 11/29/09 FEV1 0.77 (29%) with ratio 46 and 12% p B2 and DLCO 57  -  HC03  33  07/17/14  - 07/18/2015    change to spiriva respimat  - PFT's  07/22/15  1.18  FEV1 1.18 (49 % ) ratio 58  p 16 % improvement from saba p nothing prior to study with DLCO  43 % corrects to 76 % for alv volume     - 08/08/2015  After extensive coaching HFA effectiveness =    90% from baseline 75%   She had classic Group D pattern symptoms by hx with freq exac and has  GOLD II/III criteria so laba/lama/ics best choice for maint rx  I had an extended discussion with the patient reviewing all relevant studies completed to date and  lasting 15 to 20 minutes of a 25 minute visit    Each maintenance medication was reviewed in detail including most importantly the difference between maintenance and prns and under what circumstances the prns are to be triggered using an action plan format that is not reflected in the computer generated alphabetically organized AVS.    Please see instructions for details which were reviewed in writing and the patient given a copy highlighting the part that I personally wrote and discussed at today's ov.

## 2015-08-12 NOTE — Assessment & Plan Note (Addendum)
HC03   07/17/2014  = 33   Adequate control on present rx, reviewed > no change in rx needed as the hypercarbia /met alkalosis =  physiologic.

## 2015-08-12 NOTE — Telephone Encounter (Signed)
I spoke to pt. I advised her that her sleep study results showed that her apnea is mild and responded to cpap but is not responsible for the lower o2 saturations and Dr. Brett Fairy recommends a referral to pulmonology for hypoxia as well as a dental device to correct the mild apnea without affecting oxygen saturation. Pt says that she already sees Dr. Melvyn Novas for her pulmonologist and she will discuss these results with him. Pt says that she will think about the dental device and call us if she decides to pursue that. She asked that I send a copy of her sleep study to Dr. Inda Merlin, Dr. Melvyn Novas, and Dr. Rosana Hoes (dentist). Pt verbalized understanding of results. Pt had no questions at this time but was encouraged to call back if questions arise.

## 2015-08-20 DIAGNOSIS — H47292 Other optic atrophy, left eye: Secondary | ICD-10-CM | POA: Diagnosis not present

## 2015-08-21 DIAGNOSIS — N6459 Other signs and symptoms in breast: Secondary | ICD-10-CM | POA: Diagnosis not present

## 2015-08-26 ENCOUNTER — Other Ambulatory Visit: Payer: Self-pay | Admitting: Obstetrics and Gynecology

## 2015-08-26 DIAGNOSIS — R234 Changes in skin texture: Secondary | ICD-10-CM

## 2015-08-28 ENCOUNTER — Ambulatory Visit
Admission: RE | Admit: 2015-08-28 | Discharge: 2015-08-28 | Disposition: A | Discharge status: Home / Self Care | Payer: Commercial Managed Care - HMO | Source: Ambulatory Visit | Attending: Obstetrics and Gynecology | Admitting: Obstetrics and Gynecology

## 2015-08-28 ENCOUNTER — Other Ambulatory Visit: Payer: Self-pay | Admitting: Obstetrics and Gynecology

## 2015-08-28 DIAGNOSIS — R234 Changes in skin texture: Secondary | ICD-10-CM

## 2015-08-28 DIAGNOSIS — R922 Inconclusive mammogram: Secondary | ICD-10-CM | POA: Diagnosis not present

## 2015-08-28 DIAGNOSIS — N6489 Other specified disorders of breast: Secondary | ICD-10-CM | POA: Diagnosis not present

## 2015-09-06 ENCOUNTER — Telehealth: Payer: Self-pay | Admitting: Internal Medicine

## 2015-09-06 MED ORDER — TIOTROPIUM BROMIDE MONOHYDRATE 2.5 MCG/ACT IN AERS: 2.0000 | INHALATION_SPRAY | Freq: Every day | RESPIRATORY_TRACT | 0 refills | Status: DC

## 2015-09-06 NOTE — Telephone Encounter (Signed)
Called and spoke with Joanne Rogers and she is aware of sample that has been left up front.  Joanne Rogers stated that she will wait for Dr. Devoria Glassing refer her for the cpap issues.  Nothing further is needed.

## 2015-09-06 NOTE — Telephone Encounter (Signed)
Pt also was asking about a cpap that her doctor told her she needs

## 2015-09-09 ENCOUNTER — Other Ambulatory Visit: Payer: Self-pay | Admitting: Obstetrics and Gynecology

## 2015-09-09 DIAGNOSIS — Z1231 Encounter for screening mammogram for malignant neoplasm of breast: Secondary | ICD-10-CM

## 2015-09-20 ENCOUNTER — Telehealth: Payer: Self-pay | Admitting: Internal Medicine

## 2015-09-20 MED ORDER — BUDESONIDE-FORMOTEROL FUMARATE 160-4.5 MCG/ACT IN AERO: 2.0000 | INHALATION_SPRAY | Freq: Two times a day (BID) | RESPIRATORY_TRACT | 0 refills | Status: DC

## 2015-09-20 NOTE — Telephone Encounter (Signed)
Patient calling to get samples of Spiriva and Symbicort.  We did not have Spiriva, but gave patient samples of Symbicort.    Patient aware that Samples are ready for pick up. Nothing further needed.

## 2015-09-24 DIAGNOSIS — Z23 Encounter for immunization: Secondary | ICD-10-CM | POA: Diagnosis not present

## 2015-10-09 ENCOUNTER — Ambulatory Visit: Payer: Commercial Managed Care - HMO

## 2015-10-09 ENCOUNTER — Ambulatory Visit
Admission: RE | Admit: 2015-10-09 | Discharge: 2015-10-09 | Disposition: A | Discharge status: Home / Self Care | Payer: Commercial Managed Care - HMO | Source: Ambulatory Visit | Attending: Obstetrics and Gynecology | Admitting: Obstetrics and Gynecology

## 2015-10-09 DIAGNOSIS — Z1231 Encounter for screening mammogram for malignant neoplasm of breast: Secondary | ICD-10-CM

## 2015-10-14 ENCOUNTER — Telehealth: Payer: Self-pay | Admitting: Internal Medicine

## 2015-10-14 MED ORDER — TIOTROPIUM BROMIDE MONOHYDRATE 1.25 MCG/ACT IN AERS: 2.0000 | INHALATION_SPRAY | Freq: Every day | RESPIRATORY_TRACT | 0 refills | Status: DC

## 2015-10-14 NOTE — Telephone Encounter (Signed)
Called and spoke with pt and she is aware of 2 samples of the spiriva respimat that has been left up front and she will come by tomorrow to pick these up.

## 2015-10-23 DIAGNOSIS — R200 Anesthesia of skin: Secondary | ICD-10-CM | POA: Diagnosis not present

## 2015-11-14 ENCOUNTER — Telehealth: Payer: Self-pay | Admitting: Internal Medicine

## 2015-11-14 MED ORDER — TIOTROPIUM BROMIDE MONOHYDRATE 2.5 MCG/ACT IN AERS: 2.0000 | INHALATION_SPRAY | Freq: Every day | RESPIRATORY_TRACT | 0 refills | Status: DC

## 2015-11-14 NOTE — Telephone Encounter (Signed)
Called and spoke with pt and she is aware of samples of the spiriva up front and she will come by today to pick these up.

## 2015-11-25 DIAGNOSIS — J441 Chronic obstructive pulmonary disease with (acute) exacerbation: Secondary | ICD-10-CM | POA: Diagnosis not present

## 2015-12-04 DIAGNOSIS — J441 Chronic obstructive pulmonary disease with (acute) exacerbation: Secondary | ICD-10-CM | POA: Diagnosis not present

## 2015-12-19 ENCOUNTER — Ambulatory Visit (INDEPENDENT_AMBULATORY_CARE_PROVIDER_SITE_OTHER): Payer: Commercial Managed Care - HMO | Admitting: Cardiovascular Disease

## 2015-12-19 ENCOUNTER — Encounter: Payer: Self-pay | Admitting: Cardiovascular Disease

## 2015-12-19 VITALS — BP 98/62 | HR 70 | Ht 66.5 in | Wt 164.1 lb

## 2015-12-19 DIAGNOSIS — E782 Mixed hyperlipidemia: Secondary | ICD-10-CM

## 2015-12-19 DIAGNOSIS — I5032 Chronic diastolic (congestive) heart failure: Secondary | ICD-10-CM

## 2015-12-19 NOTE — Progress Notes (Signed)
Joanne Rogers Date of Birth  [birth year] [facility] [address]    [place], [place]  [address] [phone]  Fax  [phone]  Problem List 1. Hyperlipidemia 2. COPD 3. Chronic diastolic CHF     Joanne Rogers is a 70 y.o. female with a hx of dyspnea.  She had a normal myoview in 2007.  She has a hx of hyperliidemia.   She had recent lipids but they are not available at this time.  She has been diagnosed with COPD.  She denies any chest pain.  She had some chest wall / breast pain during there last mamogram on July.  She is exercising 2-3 times a week.  No CP or dyspnea.  Dec. 2, 2536:   Joanne Rogers is doing well.  Riding bike and exercising regularly.   Looking for a new job.  No CP or palps  Dec. 3, 6440: Joanne Rogers is doing ok.  No CP,  She has chronic shortness of breath. Has bad allergies. She has normal LV function by echo in 2007.    Dec. 6, 2016:  Doing ok from cardiac standpoint.  Has gained some weight.  Exercising regularly  breathin is ok  Dec. 7, 3474:  Joanne Rogers is seen today for follow up of her shortness of breath. She has normal left ventricle systolic function by echo. She was found have grade 1 diastolic dysfunction. Just getting over bronchitis - is still short of breath .   Current Outpatient Prescriptions on File Prior to Visit  Medication Sig Dispense Refill  . aspirin 81 MG tablet Take 81 mg by mouth at bedtime.     . beta carotene w/minerals (OCUVITE) tablet Take 1 tablet by mouth at bedtime.    . Biotin 1000 MCG tablet Take 1,000 mcg by mouth 3 (three) times daily.    . bisoprolol (ZEBETA) 5 MG tablet Take 1 tablet (5 mg total) by mouth daily. 90 tablet 2  . budesonide-formoterol (SYMBICORT) 160-4.5 MCG/ACT inhaler Inhale 2 puffs into the lungs 2 (two) times daily.    . Cholecalciferol (VITAMIN D3) 1000 units CAPS Take 1,000 Units by mouth 2 (two) times daily.    . Estradiol (ELESTRIN) 0.52 MG/0.87 GM (0.06%) GEL Place 1 application onto the skin  every morning.     Marland Kitchen levocetirizine (XYZAL) 5 MG tablet Take 5 mg by mouth every evening.    . Multiple Vitamin (MULTIVITAMIN) tablet Take 1 tablet by mouth every morning. Alive - 100 mg    . Omega-3 Fatty Acids (FISH OIL) 1000 MG CAPS Take 1 capsule by mouth every morning. Fish oil '1200mg'$  + Vitamin D    . progesterone (PROMETRIUM) 200 MG capsule Take 200 mg by mouth See admin instructions. One tablet daily for the first 12 days every four months.    . Tiotropium Bromide Monohydrate (SPIRIVA RESPIMAT) 2.5 MCG/ACT AERS Inhale 2 puffs into the lungs daily. 2 Inhaler 0  . valACYclovir (VALTREX) 1000 MG tablet Take 500 mg by mouth every morning.    . VENTOLIN HFA 108 (90 BASE) MCG/ACT inhaler Inhale 1-2 puffs into the lungs every 4 (four) hours as needed for wheezing or shortness of breath.   1   No current facility-administered medications on file prior to visit.     Allergies  Allergen Reactions  . Codeine     REACTION: hives  . Crestor [Rosuvastatin Calcium]     Ache, depression.   . Levofloxacin Nausea Only  . Lovastatin Other (See Comments)  Ache, depression.   . Simvastatin Other (See Comments)    Ache, depression.   . Statins Other (See Comments)    Muscle aches  . Latex Hives and Rash    Past Medical History:  Diagnosis Date  . Arthritis    back, hands, neck  . COPD (chronic obstructive pulmonary disease) ([place])   . DDD (degenerative disc disease), lumbar   . Dyspnea   . H/O bronchitis    allergy related  . Hypercholesterolemia   . Palpitations    "fluttering"  . Pneumonia    hx of    Past Surgical History:  Procedure Laterality Date  . CARDIOVASCULAR STRESS TEST  11/16/2005   EF 70%, NO ISCHEMIA  . CARPAL TUNNEL RELEASE Right ~1988  . DILATION AND CURETTAGE OF UTERUS     x3: x2 for miscarrages and x1 for polyp  . HEMI-MICRODISCECTOMY LUMBAR LAMINECTOMY LEVEL 1 Left 07/20/2014   Procedure: centeral decompression,L4-L5, HEMI-LAMINECTOMY MICRODISCECTOMY L4 - L5 ON  THE LEFT  LEVEL 1, forimatomy 4-5 root on the left;  Surgeon: Latanya Maudlin, MD;  Location: [facility];  Service: Orthopedics;  Laterality: Left;  . US ECHOCARDIOGRAPHY  11/19/2005   EF 55-60%    History  Smoking Status  . Former Smoker  . Packs/day: 0.50  . Years: 25.00  . Types: Cigarettes  . Quit date: 05/16/2005  Smokeless Tobacco  . Never Used    History  Alcohol Use  . 3.6 oz/week  . 6 Standard drinks or equivalent per week    Comment: social    Family History  Problem Relation Age of Onset  . Breast cancer Mother   . Emphysema Maternal Grandfather     Reviw of Systems:  Reviewed in the HPI.  All other systems are negative.  Physical Exam: BP 98/62 (BP Location: Left Arm, Patient Position: Sitting, Cuff Size: Normal)   Pulse 70   Ht 5' 6.5" (1.689 m)   Wt 164 lb 1.9 oz (74.4 kg)   BMI 26.09 kg/m  The patient is alert and oriented x 3.  The mood and affect are normal.   Skin: warm and dry.  Color is normal.   HEENT:   Normal carotids, no JVD, mucus membranes are normal. Lungs: slight wheezing ,   Few rales in bases bilaterally   Heart: RR   Abdomen: +BS, no HSM, non tender Extremities:  On palpable cords, no c/c/e Neuro:  Non focal, gait is normal    ECG: NSR at 70.    Normal ecg.  Assessment / Plan:   1. Mild chronic diastolic congestive heart failure: She has normal left ventricle systolic motion. She's able to exercise on a regular basis. Continue current medications.  2. Hyperlipidemia: She's tried numerous statins without success. Would potentially like to try statins again and add coenzyme Q10. She'll have her lipids drawn again by Dr. Inda Merlin in 6 month. I offered to refer her to lipid clinic if she would like.  3. Bronchitis / COPD - Advised her to go ahead and start the ABx that her medical doctor gave her .  I'll see her again in one year.   Mertie Moores, MD  12/19/2015 11:45 AM    [address],  [place], [place]  [address] Pager [phone] Phone: [phone]; Fax: [phone]   [facility]  [address] [place], [place]  [address] [phone]   Fax [phone]

## 2015-12-19 NOTE — Patient Instructions (Signed)

## 2015-12-20 DIAGNOSIS — E782 Mixed hyperlipidemia: Secondary | ICD-10-CM | POA: Insufficient documentation

## 2015-12-26 ENCOUNTER — Telehealth: Payer: Self-pay | Admitting: Internal Medicine

## 2015-12-26 NOTE — Telephone Encounter (Signed)
Spoke with pt. She is aware that we do not have samples at this time. Nothing further was needed.

## 2015-12-30 ENCOUNTER — Telehealth: Payer: Self-pay | Admitting: Internal Medicine

## 2015-12-30 MED ORDER — BUDESONIDE-FORMOTEROL FUMARATE 160-4.5 MCG/ACT IN AERO: 2.0000 | INHALATION_SPRAY | Freq: Two times a day (BID) | RESPIRATORY_TRACT | 0 refills | Status: DC

## 2015-12-30 NOTE — Telephone Encounter (Signed)
Spoke with pt, requesting symbicort and spiriva samples.  No spiriva samples in closet but symbicort samples left up front for pickup.  Nothing further needed.

## 2016-01-22 ENCOUNTER — Telehealth: Payer: Self-pay | Admitting: Internal Medicine

## 2016-01-22 MED ORDER — TIOTROPIUM BROMIDE MONOHYDRATE 2.5 MCG/ACT IN AERS: 2.0000 | INHALATION_SPRAY | Freq: Every day | RESPIRATORY_TRACT | 0 refills | Status: DC

## 2016-01-22 NOTE — Telephone Encounter (Signed)
No samples of Spiriva are available  She wants rx called in  I have sent 1 mo supply and advised her to get her PCP to refill and if they don't want to we will need to see her at least once per year in order to keep refilling her meds  She verbalized understanding and rx was sent

## 2016-01-23 ENCOUNTER — Telehealth: Payer: Self-pay | Admitting: Internal Medicine

## 2016-01-23 NOTE — Telephone Encounter (Signed)
Error.Joanne Rogers ° °

## 2016-02-05 DIAGNOSIS — R69 Illness, unspecified: Secondary | ICD-10-CM | POA: Diagnosis not present

## 2016-02-05 DIAGNOSIS — K006 Disturbances in tooth eruption: Secondary | ICD-10-CM | POA: Diagnosis not present

## 2016-02-13 DIAGNOSIS — D1801 Hemangioma of skin and subcutaneous tissue: Secondary | ICD-10-CM | POA: Diagnosis not present

## 2016-02-13 DIAGNOSIS — D234 Other benign neoplasm of skin of scalp and neck: Secondary | ICD-10-CM | POA: Diagnosis not present

## 2016-02-13 DIAGNOSIS — D485 Neoplasm of uncertain behavior of skin: Secondary | ICD-10-CM | POA: Diagnosis not present

## 2016-02-13 DIAGNOSIS — Z85828 Personal history of other malignant neoplasm of skin: Secondary | ICD-10-CM | POA: Diagnosis not present

## 2016-02-13 DIAGNOSIS — L821 Other seborrheic keratosis: Secondary | ICD-10-CM | POA: Diagnosis not present

## 2016-02-13 DIAGNOSIS — L820 Inflamed seborrheic keratosis: Secondary | ICD-10-CM | POA: Diagnosis not present

## 2016-02-13 DIAGNOSIS — L720 Epidermal cyst: Secondary | ICD-10-CM | POA: Diagnosis not present

## 2016-02-14 ENCOUNTER — Other Ambulatory Visit: Payer: Self-pay | Admitting: Cardiovascular Disease

## 2016-03-25 DIAGNOSIS — J441 Chronic obstructive pulmonary disease with (acute) exacerbation: Secondary | ICD-10-CM | POA: Diagnosis not present

## 2016-03-26 ENCOUNTER — Ambulatory Visit
Admission: RE | Admit: 2016-03-26 | Discharge: 2016-03-26 | Disposition: A | Discharge status: Home / Self Care | Payer: Medicare HMO | Source: Ambulatory Visit | Attending: Family Medicine | Admitting: Family Medicine

## 2016-03-26 ENCOUNTER — Other Ambulatory Visit: Payer: Self-pay | Admitting: Family Medicine

## 2016-03-26 DIAGNOSIS — J441 Chronic obstructive pulmonary disease with (acute) exacerbation: Secondary | ICD-10-CM

## 2016-03-26 DIAGNOSIS — R0602 Shortness of breath: Secondary | ICD-10-CM | POA: Diagnosis not present

## 2016-04-22 DIAGNOSIS — J441 Chronic obstructive pulmonary disease with (acute) exacerbation: Secondary | ICD-10-CM | POA: Diagnosis not present

## 2016-04-22 DIAGNOSIS — J301 Allergic rhinitis due to pollen: Secondary | ICD-10-CM | POA: Diagnosis not present

## 2016-05-06 ENCOUNTER — Telehealth: Payer: Self-pay | Admitting: Internal Medicine

## 2016-05-06 NOTE — Telephone Encounter (Signed)
Called and spoke to pt. Pt requesting Spiriva respimat 2.79mg samples. Advised pt we do not have any samples at this time. Pt states she will call back because she is waiting on her mail order to come in. Nothing further needed at this time.

## 2016-05-08 DIAGNOSIS — J441 Chronic obstructive pulmonary disease with (acute) exacerbation: Secondary | ICD-10-CM | POA: Diagnosis not present

## 2016-05-11 ENCOUNTER — Telehealth: Payer: Self-pay | Admitting: Internal Medicine

## 2016-05-11 ENCOUNTER — Encounter: Payer: Self-pay | Admitting: Internal Medicine

## 2016-05-11 ENCOUNTER — Other Ambulatory Visit: Payer: Self-pay | Admitting: Internal Medicine

## 2016-05-11 ENCOUNTER — Ambulatory Visit (INDEPENDENT_AMBULATORY_CARE_PROVIDER_SITE_OTHER): Payer: Medicare HMO | Admitting: Internal Medicine

## 2016-05-11 ENCOUNTER — Ambulatory Visit (INDEPENDENT_AMBULATORY_CARE_PROVIDER_SITE_OTHER)
Admission: RE | Admit: 2016-05-11 | Discharge: 2016-05-11 | Disposition: A | Discharge status: Home / Self Care | Payer: Medicare HMO | Source: Ambulatory Visit | Attending: Internal Medicine | Admitting: Internal Medicine

## 2016-05-11 VITALS — BP 114/74 | HR 75 | Ht 66.75 in | Wt 166.0 lb

## 2016-05-11 DIAGNOSIS — R05 Cough: Secondary | ICD-10-CM | POA: Diagnosis not present

## 2016-05-11 DIAGNOSIS — J449 Chronic obstructive pulmonary disease, unspecified: Secondary | ICD-10-CM | POA: Diagnosis not present

## 2016-05-11 DIAGNOSIS — J441 Chronic obstructive pulmonary disease with (acute) exacerbation: Secondary | ICD-10-CM | POA: Diagnosis not present

## 2016-05-11 MED ORDER — AMOXICILLIN-POT CLAVULANATE 875-125 MG PO TABS: 1.0000 | ORAL_TABLET | Freq: Two times a day (BID) | ORAL | 0 refills | Status: DC

## 2016-05-11 MED ORDER — FLUTTER DEVI: 1.0000 | 0 refills | Status: AC | PRN

## 2016-05-11 MED ORDER — FLUTTER DEVI: 1.0000 | 0 refills | Status: DC | PRN

## 2016-05-11 MED ORDER — PREDNISONE 10 MG PO TABS: ORAL_TABLET | ORAL | 0 refills | Status: DC

## 2016-05-11 NOTE — Progress Notes (Signed)
Subjective:    Patient ID: Joanne Rogers, female    DOB: 02/02/1945     MRN: [id number]    Brief patient profile:  37   yowf quit smoking 2007  With documented copd  had been ok on spiriva but gradually worse since 2014   and some better since symbicort added fall 2016 referred to pulmonary clinic 07/18/2015 by Dr Joanne Rogers for freq aecopd with baseline GOLD III  criteria.   07/18/2015 1st [facility] office visit/ Joanne Rogers  rx spiriva dip/ symbicort hfa  Chief Complaint  Patient presents with  . PULMONARY CONSULT    Referred by Dr. Darcus Rogers. Pt has not been seen since 2012 for COPD. Pt reports recent COPD flare that was treated with oral prednisone. Pt has improved. Pt c/o current SOB/wheeze and occasional CP/tightness. Pt does use albuterol HFA with some improvement in symptoms.   avg alb 2 x daily  MMRC2 = can't walk a nl pace on a flat grade s sob but does fine slow and flat ie indoor walking/shopping rec Plan A = Automatic = Symbicort 160 Take 2 puffs first thing in am and then another 2 puffs about 12 hours later and spiriva respimat 2 each am  Plan B = Backup Only use your albuterol as a rescue medication   Plan C = Crisis - only use your albuterol nebulizer if you first try Plan B      08/08/2015  f/u ov/Joanne Rogers re:  GOLD II/III  symbicort 160 / spriva no need for saba  Doe = MMRC1 = can walk nl pace, flat grade, can't hurry or go uphills or steps s sob   rec No change in medications but Work on inhaler technique:   Call me if you want to do pulmonary rehab     05/11/2016 acute extended ov/Joanne Rogers re:  GOLD  III  On symb 160/ spiriva respimat Chief Complaint  Patient presents with  . Acute Visit    Bronchitis/COPD flare up. Increased SOB, coughing, chest congestion. Productive cough with green-yellow phlegm.   acutely ill x 6 week  7 weeks prior to ov needed avg albuterol twice daily now up to 4 X daily    No obvious day to day or daytime variability or assoc   mucus plugs  or hemoptysis or cp or chest tightness, subjective wheeze or overt sinus or hb symptoms. No unusual exp hx or h/o childhood pna/ asthma or knowledge of premature birth.  Sleeping ok without nocturnal  or early am exacerbation  of respiratory  c/o's or need for noct saba. Also denies any obvious fluctuation of symptoms with weather or environmental changes or other aggravating or alleviating factors except as outlined above   Current Medications, Allergies, Complete Past Medical History, Past Surgical History, Family History, and Social History were reviewed in [employer] record.  ROS  The following are not active complaints unless bolded sore throat, dysphagia, dental problems, itching, sneezing,  nasal congestion or excess/ purulent secretions, ear ache,   fever, chills, sweats, unintended wt loss, classically pleuritic or exertional cp,  orthopnea pnd or leg swelling, presyncope, palpitations, abdominal pain, anorexia, nausea, vomiting, diarrhea  or change in bowel or bladder habits, change in stools or urine, dysuria,hematuria,  rash, arthralgias, visual complaints, headache, numbness, weakness or ataxia or problems with walking or coordination,  change in mood/affect or memory.                               Marland Kitchen  Objective:   Physical Exam  amb wf nad   05/11/2016        166  08/08/2015        168   07/18/15 168 lb 12.8 oz (76.567 kg)  02/19/15 168 lb (76.204 kg)  12/18/14 170 lb (77.111 kg)    Vital signs reviewed  - Note on arrival 02 sats  91 % on RA      HEENT: nl dentition, turbinates, and oropharynx. Nl external ear canals without cough reflex   NECK :  without JVD/Nodes/TM/ nl carotid upstrokes bilaterally   LUNGS: no acc muscle use, hyperinflated chest  With short Ti/ prolonged Texp with pan exp wheeze   CV:  RRR  no s3 or murmur or increase in P2, no edema   ABD:  soft and nontender with nl inspiratory excursion in the supine  position. No bruits or organomegaly, bowel sounds nl  MS:  Nl gait/ ext warm without deformities, calf tenderness, cyanosis or clubbing No obvious joint restrictions   SKIN: warm and dry without lesions    NEURO:  alert, approp, nl sensorium with  no motor deficits      CXR PA and Lateral:   05/11/2016 :    I personally reviewed images and agree with radiology impression as follows:    Lung volumes are normal. No consolidative airspace disease. No pleural effusions. No pneumothorax. No pulmonary nodule or mass noted. Pulmonary vasculature and the cardiomediastinal silhouette are within normal limits. Atherosclerosis in the thoracic aorta.      Assessment & Plan:

## 2016-05-11 NOTE — Telephone Encounter (Signed)
This was an error  Pt was given flutter valve here in the office and rx printed

## 2016-05-11 NOTE — Progress Notes (Signed)
Spoke with pt and notified of results per Dr. Wert. Pt verbalized understanding and denied any questions. 

## 2016-05-11 NOTE — Patient Instructions (Addendum)
For cough > mucinex dm 1200 mg every 12 hours and use flutter valve as much as possible  Augmentin 875 mg take one pill twice daily  X 10 days - take at breakfast and supper with large glass of water.  It would help reduce the usual side effects (diarrhea and yeast infections) if you ate cultured yogurt at lunch.   Prednisone 10 mg take  4 each am x 2 days,   2 each am x 2 days,  1 each am x 2 days and stop   Work on inhaler technique:  relax and gently blow all the way out then take a nice smooth deep breath back in, triggering the inhaler at same time you start breathing in.  Hold for up to 5 seconds if you can. Blow out thru nose. Rinse and gargle with water when done    Please remember to go to the  Xray department downstairs in the basement  for your tests - we will call you with the results when they are available.     Please schedule a follow up office visit in 2  weeks, sooner if needed  with all medications /inhalers/ solutions in hand so we can verify exactly what you are taking. This includes all medications from all doctors and over the counters

## 2016-05-12 ENCOUNTER — Telehealth: Payer: Self-pay | Admitting: Internal Medicine

## 2016-05-12 MED ORDER — CEFDINIR 300 MG PO CAPS: 300.0000 mg | ORAL_CAPSULE | Freq: Two times a day (BID) | ORAL | 0 refills | Status: DC

## 2016-05-12 NOTE — Telephone Encounter (Signed)
MW  Please Advise-  Pt called in and stated after taking the Augmentin, she has vomited three times within 4 hours and wanted to know if she could be prescribed something else. Also she wanted to make you aware that your avs stated you wanted her to come back in 2 weeks but you had nothing available so they pushed it to 3 weeks is this ok?    Tanda Rockers, MD (Physician)    For cough > mucinex dm 1200 mg every 12 hours and use flutter valve as much as possible  Augmentin 875 mg take one pill twice daily  X 10 days - take at breakfast and supper with large glass of water.  It would help reduce the usual side effects (diarrhea and yeast infections) if you ate cultured yogurt at lunch.   Prednisone 10 mg take  4 each am x 2 days,   2 each am x 2 days,  1 each am x 2 days and stop   Work on inhaler technique:  relax and gently blow all the way out then take a nice smooth deep breath back in, triggering the inhaler at same time you start breathing in.  Hold for up to 5 seconds if you can. Blow out thru nose. Rinse and gargle with water when done    Please remember to go to the  department downstairs in the basement  for your tests - we will call you with the results when they are available.     Please schedule a follow up office visit in 2  weeks, sooner if needed  with all medications /inhalers/ solutions in hand so we can verify exactly what you are taking. This includes all medications from all doctors and over the [employer] on [address]

## 2016-05-12 NOTE — Telephone Encounter (Signed)
Pt aware of rx change.  Amoxicillin added to allergy list.  Nothing further needed.

## 2016-05-12 NOTE — Telephone Encounter (Signed)
Sorry to hear that, list it as allergy rec omnicef 300 mb bid x 10 days

## 2016-05-13 NOTE — Assessment & Plan Note (Signed)
See aecopd/ f/u in 2 weeks to regroup re longterm rx

## 2016-05-13 NOTE — Assessment & Plan Note (Addendum)
-    quit smoking 2007 -  HC03 level 43 05/2009  -  PFT's 11/29/09 FEV1 0.77 (29%) with ratio 46 and 12% p B2 and DLCO 57  -  HC03  33  07/17/14  - 07/18/2015    change to spiriva respimat  - PFT's  07/22/15  1.18  FEV1 1.18 (49 % ) ratio 58  p 16 % improvement from saba p nothing prior to study with DLCO  43 % corrects to 76 % for alv volume     - 05/11/2016  After extensive coaching HFA effectiveness =    90% from baseline 75% (short Ti)    DDX of  difficult airways management almost all start with A and  include Adherence, Ace Inhibitors, Acid Reflux, Active Sinus Disease, Alpha 1 Antitripsin deficiency, Anxiety masquerading as Airways dz,  ABPA,  Allergy(esp in young), Aspiration (esp in elderly), Adverse effects of meds,  Active smokers, A bunch of PE's (a small clot burden can't cause this syndrome unless there is already severe underlying pulm or vascular dz with poor reserve) plus two Bs  = Bronchiectasis and Beta blocker use..and one C= CHF   Adherence is always the initial "prime suspect" and is a multilayered concern that requires a "trust but verify" approach in every patient - starting with knowing how to use medications, especially inhalers, correctly, keeping up with refills and understanding the fundamental difference between maintenance and prns vs those medications only taken for a very short course and then stopped and not refilled.  - see hfa teaching above > ok to continue hfa/smi - return with all meds in hand using a trust but verify approach to confirm accurate Medication  Reconciliation The principal here is that until we are certain that the  patients are doing what we've asked, it makes no sense to ask them to do more.    ? Allergy/ asthma > Prednisone 10 mg take  4 each am x 2 days,   2 each am x 2 days,  1 each am x 2 days and stop  ? Active sinus dz > augmentin then sinus ct if not improved   ?  Acid reflux  > note taking fish oil and prometrium so there is a risk and  May  need to address in future if pattern persists   ? Active smoking > denies/ reinforced   ? BB > not an issue on low dose zebeta   ? Bronchiectasis >  Add flutter but no need for hrct unless remains refractory   I had an extended discussion with the patient reviewing all relevant studies completed to date and  lasting 25 minutes of a 40  minute acute office  Visit addressing severe non-specific but potentially very serious refractory respiratory symptoms of uncertain and potentially multiple  etiologies.   Each maintenance medication was reviewed in detail including most importantly the difference between maintenance and prns and under what circumstances the prns are to be triggered using an action plan format that is not reflected in the computer generated alphabetically organized AVS.    Please see AVS for specific instructions unique to this office visit that I personally wrote and verbalized to the the pt in detail and then reviewed with pt  by my nurse highlighting any changes in therapy/plan of care  recommended at today's visit.

## 2016-06-01 ENCOUNTER — Telehealth: Payer: Self-pay | Admitting: Internal Medicine

## 2016-06-01 ENCOUNTER — Ambulatory Visit (INDEPENDENT_AMBULATORY_CARE_PROVIDER_SITE_OTHER): Payer: Medicare HMO | Admitting: Internal Medicine

## 2016-06-01 ENCOUNTER — Encounter: Payer: Self-pay | Admitting: Internal Medicine

## 2016-06-01 VITALS — BP 120/62 | HR 76 | Ht 66.75 in | Wt 163.0 lb

## 2016-06-01 DIAGNOSIS — R058 Other specified cough: Secondary | ICD-10-CM | POA: Insufficient documentation

## 2016-06-01 DIAGNOSIS — J449 Chronic obstructive pulmonary disease, unspecified: Secondary | ICD-10-CM

## 2016-06-01 DIAGNOSIS — R05 Cough: Secondary | ICD-10-CM

## 2016-06-01 MED ORDER — ALBUTEROL SULFATE (2.5 MG/3ML) 0.083% IN NEBU: 2.5000 mg | INHALATION_SOLUTION | RESPIRATORY_TRACT | 11 refills | Status: DC | PRN

## 2016-06-01 MED ORDER — PREDNISONE 10 MG PO TABS: ORAL_TABLET | ORAL | 0 refills | Status: DC

## 2016-06-01 NOTE — Assessment & Plan Note (Signed)
-    quit smoking 2007 -  HC03 level 43 05/2009  -  PFT's 11/29/09 FEV1 0.77 (29%) with ratio 46 and 12% p B2 and DLCO 57  -  HC03  33  07/17/14  - 07/18/2015    change to spiriva respimat  - PFT's  07/22/15  1.18  FEV1 1.18 (49 % ) ratio 58  p 16 % improvement from saba p nothing prior to study with DLCO  43 % corrects to 76 % for alv volume     - 05/11/2016 added flutter valve  - 06/01/2016  After extensive coaching HFA effectiveness =    90% from baseline 75% (short Ti)   Copd / ab component has improved but not the cough which I suspect is UACS (see separate a/p)   No changes needed in copd rx except needs to understand the difference between main rx and prns  I had an extended discussion with the patient reviewing all relevant studies completed to date and  lasting 15 to 20 minutes of a 25 minute visit    Each maintenance medication was reviewed in detail including most importantly the difference between maintenance and prns and under what circumstances the prns are to be triggered using an action plan format that is not reflected in the computer generated alphabetically organized AVS.    Please see AVS for specific instructions unique to this visit that I personally wrote and verbalized to the the pt in detail and then reviewed with pt  by my nurse highlighting any  changes in therapy recommended at today's visit to their plan of care.

## 2016-06-01 NOTE — Assessment & Plan Note (Signed)
-    quit smoking 2007 -  HC03 level 43 05/2009  -  PFT's 11/29/09 FEV1 0.77 (29%) with ratio 46 and 12% p B2 and DLCO 57  -  HC03  33  07/17/14  - 07/18/2015    change to spiriva respimat  - PFT's  07/22/15  1.18  FEV1 1.18 (49 % ) ratio 58  p 16 % improvement from saba p nothing prior to study with DLCO  43 % corrects to 76 % for alv volume    - 05/11/2016 added flutter valve  - 06/01/2016  After extensive coaching HFA effectiveness =    90% from baseline 75% (short Ti)   Upper airway cough syndrome (previously labeled PNDS) , is  so named because it's frequently impossible to sort out how much is  CR/sinusitis with freq throat clearing (which can be related to primary GERD)   vs  causing  secondary (" extra esophageal")  GERD from wide swings in gastric pressure that occur with throat clearing, often  promoting self use of mint and menthol lozenges that reduce the lower esophageal sphincter tone and exacerbate the problem further in a cyclical fashion.   These are the same pts (now being labeled as having "irritable larynx syndrome" by some cough centers) who not infrequently have a history of having failed to tolerate ace inhibitors,  dry powder inhalers or biphosphonates or report having atypical/extraesophageal reflux symptoms that don't respond to standard doses of PPI  and are easily confused as having aecopd or asthma flares by even experienced allergists/ pulmonologists (myself included).  Of the three most common causes of  Sub-acute or recurrent or chronic cough, only one (GERD)  can actually contribute to/ trigger  the other two (asthma and post nasal drip syndrome)  and perpetuate the cylce of cough.  While not intuitively obvious, many patients with chronic low grade reflux do not cough until there is a primary insult that disturbs the protective epithelial barrier and exposes sensitive nerve endings.   This is typically viral but can be direct physical injury such as with an endotracheal tube.    The point is that once this occurs, it is difficult to eliminate the cycle  using anything but a maximally effective acid suppression regimen at least in the short run, accompanied by an appropriate diet to address non acid GERD and control / eliminate the cough itself for at least 3 days with flutter and mucinex dm   rec  ppi ac and h2hs, Next step sinus ct if not better  see avs for instructions unique to this ov

## 2016-06-01 NOTE — Patient Instructions (Signed)
Plan A = Automatic =  Symbicort 160 Take 2 puffs first thing in am and then another 2 puffs about 12 hours later and Spiriva respimat 2 pffs each am  And finish prednisone x 6 days   Work on inhaler technique:  relax and gently blow all the way out then take a nice smooth deep breath back in, triggering the inhaler at same time you start breathing in.  Hold for up to 5 seconds if you can. Blow out thru nose. Rinse and gargle with water when done     Plan B = Backup Only use your albuterol (blue gray/green = VENTOLIN) as a rescue medication to be used if you can't catch your breath by resting or doing a relaxed purse lip breathing pattern.  - The less you use it, the better it will work when you need it. - Ok to use the inhaler up to 2 puffs  every 4 hours if you must but call for appointment if use goes up over your usual need - Don't leave home without it !!  (think of it like the spare tire for your car)    Plan C = Crisis - only use your albuterol nebulizer if you first try Plan B and it fails to help > ok to use the nebulizer up to every 4 hours but if start needing it regularly call for immediate appointment    Pantoprazole (protonix) 40 mg   Take  30-60 min before first meal of the day and Pepcid (famotidine)  20 mg one @  bedtime until return to office - this is the best way to tell whether stomach acid is contributing to your problem.    GERD (REFLUX)  is an extremely common cause of respiratory symptoms just like yours , many times with no obvious heartburn at all.    It can be treated with medication, but also with lifestyle changes including elevation of the head of your bed (ideally with 6 inch  bed blocks),  Smoking cessation, avoidance of late meals, excessive alcohol, and avoid fatty foods, chocolate, peppermint, colas, red wine, and acidic juices such as orange juice.  NO MINT OR MENTHOL PRODUCTS SO NO COUGH DROPS   USE SUGARLESS CANDY INSTEAD (Jolley ranchers or Stover's or  Life Savers) or even ice chips will also do - the key is to swallow to prevent all throat clearing. NO OIL BASED VITAMINS - use powdered substitutes.   Please schedule a follow up office visit in 4 weeks, sooner if needed  with all medications /inhalers/ solutions in hand so we can verify exactly what you are taking. This includes all medications from all doctors and over the counters

## 2016-06-01 NOTE — Telephone Encounter (Signed)
New rx escribed with dx code attached.  Nothing further needed.

## 2016-06-01 NOTE — Progress Notes (Signed)
Subjective:    Patient ID: Joanne Rogers, female    DOB: [birth year]     MRN: [id number]    Brief patient profile:  49   yowf quit smoking 2007  With documented copd  had been ok on spiriva but gradually worse since 2014   and some better since symbicort added fall 2016 referred to pulmonary clinic 07/18/2015 by Dr Inda Merlin for freq aecopd with baseline GOLD III  criteria.    History of Present Illness  07/18/2015 1st [facility] office visit/ [facility]  rx spiriva dip/ symbicort hfa  Chief Complaint  Patient presents with  . PULMONARY CONSULT    Referred by Dr. Darcus Austin. Pt has not been seen since 2012 for COPD. Pt reports recent COPD flare that was treated with oral prednisone. Pt has improved. Pt c/o current SOB/wheeze and occasional CP/tightness. Pt does use albuterol HFA with some improvement in symptoms.   avg alb 2 x daily  MMRC2 = can't walk a nl pace on a flat grade s sob but does fine slow and flat ie indoor walking/shopping rec Plan A = Automatic = Symbicort 160 Take 2 puffs first thing in am and then another 2 puffs about 12 hours later and spiriva respimat 2 each am  Plan B = Backup Only use your albuterol as a rescue medication   Plan C = Crisis - only use your albuterol nebulizer if you first try Plan B    05/11/2016 acute extended ov/Wert re:  GOLD  III  On symb 160/ spiriva respimat Chief Complaint  Patient presents with  . Acute Visit    Bronchitis/COPD flare up. Increased SOB, coughing, chest congestion. Productive cough with green-yellow phlegm.   acutely ill x 6 week  7 weeks prior to ov needed avg albuterol twice daily now up to 4 X daily rec For cough > mucinex dm 1200 mg every 12 hours and use flutter valve as much as possible Augmentin 875 mg take one pill twice daily  X 10 days - take at breakfast and supper with large glass of water.  It would help reduce the usual side effects (diarrhea and yeast infections) if you ate cultured yogurt at lunch.    Prednisone 10 mg take  4 each am x 2 days,   2 each am x 2 days,  1 each am x 2 days and stop  Work on inhaler technique:        06/01/2016  f/u ov/Wert re:   GOLD III on symb 160 / spiriva  And "flare since early March - prednisone worked the best  Chief Complaint  Patient presents with  . Follow-up    Breathing has not improved and she is still coughing. Her cough is prod with clear sputum.   no excess mucus but waking up 3 x noct and in am with severe grinding upper airway cough and sensation of "mucus stuck in my throat".  Doe back to = MMRC1 = can walk nl pace, flat grade, can't hurry or go uphills or steps s sob   Still using duonbeb bid in addition to maint symb/spiriva   No obvious day to day or daytime variability or assoc excess/ purulent sputum or mucus plugs or hemoptysis or cp or chest tightness, subjective wheeze or overt sinus or hb symptoms. No unusual exp hx or h/o childhood pna/ asthma or knowledge of premature birth.   Also denies any obvious fluctuation of symptoms with weather or environmental changes or other  aggravating or alleviating factors except as outlined above   Current Medications, Allergies, Complete Past Medical History, Past Surgical History, Family History, and Social History were reviewed in [employer] record.  ROS  The following are not active complaints unless bolded sore throat, dysphagia, dental problems, itching, sneezing,  nasal congestion or excess/ purulent secretions, ear ache,   fever, chills, sweats, unintended wt loss, classically pleuritic or exertional cp,  orthopnea pnd or leg swelling, presyncope, palpitations, abdominal pain, anorexia, nausea, vomiting, diarrhea  or change in bowel or bladder habits, change in stools or urine, dysuria,hematuria,  rash, arthralgias, visual complaints, headache, numbness, weakness or ataxia or problems with walking or coordination,  change in mood/affect or memory.             .          Objective:   Physical Exam  amb wf nad with classic upper airway pattern coughinbg   06/01/2016         163  05/11/2016        166  08/08/2015        168   07/18/15 168 lb 12.8 oz (76.567 kg)  02/19/15 168 lb (76.204 kg)  12/18/14 170 lb (77.111 kg)    Vital signs reviewed  - Note on arrival 02 sats  90 % on RA      HEENT: nl dentition, turbinates, and oropharynx which is pristine. Nl external ear canals without cough reflex   NECK :  without JVD/Nodes/TM/ nl carotid upstrokes bilaterally   LUNGS: no acc muscle use,  Distant bs, no wheeze and no cough on insp or exp   CV:  RRR  no s3 or murmur or increase in P2, no edema   ABD:  soft and nontender with nl inspiratory excursion in the supine position. No bruits or organomegaly, bowel sounds nl  MS:  Nl gait/ ext warm without deformities, calf tenderness, cyanosis or clubbing No obvious joint restrictions   SKIN: warm and dry without lesions    NEURO:  alert, approp, nl sensorium with  no motor deficits       CXR PA and Lateral:   05/11/2016 :    I personally reviewed images and agree with radiology impression as follows:    Lung volumes are normal. No consolidative airspace disease. No pleural effusions. No pneumothorax. No pulmonary nodule or mass noted. Pulmonary vasculature and the cardiomediastinal silhouette are within normal limits. Atherosclerosis in the thoracic aorta.      Assessment & Plan:

## 2016-06-02 ENCOUNTER — Other Ambulatory Visit: Payer: Self-pay | Admitting: Internal Medicine

## 2016-06-02 ENCOUNTER — Telehealth: Payer: Self-pay | Admitting: Internal Medicine

## 2016-06-02 MED ORDER — PANTOPRAZOLE SODIUM 40 MG PO TBEC: DELAYED_RELEASE_TABLET | ORAL | 6 refills | Status: DC

## 2016-06-02 MED ORDER — ALBUTEROL SULFATE (2.5 MG/3ML) 0.083% IN NEBU: 2.5000 mg | INHALATION_SOLUTION | RESPIRATORY_TRACT | 11 refills | Status: DC | PRN

## 2016-06-02 NOTE — Telephone Encounter (Signed)
Pt needing Protonix '40mg'$  sent to [facility] - this was not sent at [facility] 06/01/16.  This has been sent as requested. Nothing further needed.  Pantoprazole (protonix) 40 mg   Take  30-60 min before first meal of the day and Pepcid (famotidine)  20 mg one @  bedtime until return to office - this is the best way to tell whether stomach acid is contributing to your problem.

## 2016-06-03 DIAGNOSIS — Z780 Asymptomatic menopausal state: Secondary | ICD-10-CM | POA: Diagnosis not present

## 2016-06-03 DIAGNOSIS — A6009 Herpesviral infection of other urogenital tract: Secondary | ICD-10-CM | POA: Diagnosis not present

## 2016-06-30 ENCOUNTER — Encounter: Payer: Self-pay | Admitting: Internal Medicine

## 2016-06-30 ENCOUNTER — Ambulatory Visit (INDEPENDENT_AMBULATORY_CARE_PROVIDER_SITE_OTHER): Payer: Medicare HMO | Admitting: Internal Medicine

## 2016-06-30 VITALS — BP 120/66 | HR 69 | Ht 66.0 in | Wt 166.0 lb

## 2016-06-30 DIAGNOSIS — J9612 Chronic respiratory failure with hypercapnia: Secondary | ICD-10-CM | POA: Diagnosis not present

## 2016-06-30 DIAGNOSIS — J449 Chronic obstructive pulmonary disease, unspecified: Secondary | ICD-10-CM | POA: Diagnosis not present

## 2016-06-30 MED ORDER — BUDESONIDE-FORMOTEROL FUMARATE 160-4.5 MCG/ACT IN AERO: 2.0000 | INHALATION_SPRAY | Freq: Two times a day (BID) | RESPIRATORY_TRACT | 0 refills | Status: DC

## 2016-06-30 MED ORDER — BUDESONIDE-FORMOTEROL FUMARATE 160-4.5 MCG/ACT IN AERO: 2.0000 | INHALATION_SPRAY | Freq: Two times a day (BID) | RESPIRATORY_TRACT | 11 refills | Status: DC

## 2016-06-30 NOTE — Progress Notes (Signed)
Subjective:    Patient ID: Joanne Rogers, female    DOB: [birth year]     MRN: [id number]    Brief patient profile:  9   yowf quit smoking 2007  With documented copd  had been ok on spiriva but gradually worse since 2014   and some better since symbicort added fall 2016 referred to pulmonary clinic 07/18/2015 by Dr Inda Merlin for freq aecopd with baseline GOLD III  criteria.    History of Present Illness  07/18/2015 1st  Pulmonary office visit/ Wert  rx spiriva dip/ symbicort hfa  Chief Complaint  Patient presents with  . PULMONARY CONSULT    Referred by Dr. Darcus Austin. Pt has not been seen since 2012 for COPD. Pt reports recent COPD flare that was treated with oral prednisone. Pt has improved. Pt c/o current SOB/wheeze and occasional CP/tightness. Pt does use albuterol HFA with some improvement in symptoms.   avg alb 2 x daily  MMRC2 = can't walk a nl pace on a flat grade s sob but does fine slow and flat ie indoor walking/shopping rec Plan A = Automatic = Symbicort 160 Take 2 puffs first thing in am and then another 2 puffs about 12 hours later and spiriva respimat 2 each am  Plan B = Backup Only use your albuterol as a rescue medication   Plan C = Crisis - only use your albuterol nebulizer if you first try Plan B    05/11/2016 acute extended ov/Wert re:  GOLD  III  On symb 160/ spiriva respimat Chief Complaint  Patient presents with  . Acute Visit    Bronchitis/COPD flare up. Increased SOB, coughing, chest congestion. Productive cough with green-yellow phlegm.   acutely ill x 6 week  7 weeks prior to ov needed avg albuterol twice daily now up to 4 X daily rec For cough > mucinex dm 1200 mg every 12 hours and use flutter valve as much as possible Augmentin 875 mg take one pill twice daily  X 10 days - take at breakfast and supper with large glass of water.  It would help reduce the usual side effects (diarrhea and yeast infections) if you ate cultured yogurt at lunch.    Prednisone 10 mg take  4 each am x 2 days,   2 each am x 2 days,  1 each am x 2 days and stop  Work on inhaler technique:        06/01/2016  f/u ov/Wert re:   GOLD III on symb 160 / spiriva  And "flare since early March - prednisone worked the best  Chief Complaint  Patient presents with  . Follow-up    Breathing has not improved and she is still coughing. Her cough is prod with clear sputum.   no excess mucus but waking up 3 x noct and in am with severe grinding upper airway cough and sensation of "mucus stuck in my throat".  Doe back to = MMRC1 = can walk nl pace, flat grade, can't hurry or go uphills or steps s sob   Still using duonbeb bid in addition to maint symb/spiriva  rec Plan A = Automatic =  Symbicort 160 Take 2 puffs first thing in am and then another 2 puffs about 12 hours later and Spiriva respimat 2 pffs each am  And finish prednisone x 6 days  Work on inhaler technique:  Plan B = Backup Only use your albuterol (blue gray/green = VENTOLIN) as a rescue medication Plan  C = Crisis - only use your albuterol nebulizer if you first try Plan B and it fails to help > ok to use the nebulizer up to every 4 hours but if start needing it regularly call for immediate appointment Pantoprazole (protonix) 40 mg   Take  30-60 min before first meal of the day and Pepcid (famotidine)  20 mg one @  bedtime until return to office - this is the best way to tell whether stomach acid is contributing to your problem.   GERD rx   06/30/2016  f/u ov/Wert re:  GOLD III  On symb/spiriva dpi and rare saba (ventolin = her green inhaler)  Chief Complaint  Patient presents with  . Follow-up    Pt denies having any complaints since last visit. Since last visit, for the first 2 weeks pt did have some coughing and SOB but since then things have been normal. Denies having any chest pain. No concerns with today's visit.   Doe = MMRC1 = can walk nl pace, flat grade, can't hurry or go uphills or steps s sob    Very confused with names of inhalers, wants respimat saba (informed not commercially available yet x as combivent, which is not approp when already on spiriva respimat)   No obvious day to day or daytime variability or assoc excess/ purulent sputum or mucus plugs or hemoptysis or cp or chest tightness, subjective wheeze or overt sinus or hb symptoms. No unusual exp hx or h/o childhood pna/ asthma or knowledge of premature birth.  Sleeping ok without nocturnal  or early am exacerbation  of respiratory  c/o's or need for noct saba. Also denies any obvious fluctuation of symptoms with weather or environmental changes or other aggravating or alleviating factors except as outlined above   Current Medications, Allergies, Complete Past Medical History, Past Surgical History, Family History, and Social History were reviewed in [employer] record.  ROS  The following are not active complaints unless bolded sore throat, dysphagia, dental problems, itching, sneezing,  nasal congestion or excess/ purulent secretions, ear ache,   fever, chills, sweats, unintended wt loss, classically pleuritic or exertional cp,  orthopnea pnd or leg swelling, presyncope, palpitations, abdominal pain, anorexia, nausea, vomiting, diarrhea  or change in bowel or bladder habits, change in stools or urine, dysuria,hematuria,  rash, arthralgias, visual complaints, headache, numbness, weakness or ataxia or problems with walking or coordination,  change in mood/affect or memory.                               .         Objective:   Physical Exam  amb wf nad    06/30/2016        166  06/01/2016         163  05/11/2016        166  08/08/2015        168   07/18/15 168 lb 12.8 oz (76.567 kg)  02/19/15 168 lb (76.204 kg)  12/18/14 170 lb (77.111 kg)    Vital signs reviewed - - Note on arrival 02 sats  92% on RA     HEENT: nl dentition, turbinates, and oropharynx which is pristine. Nl external ear  canals without cough reflex   NECK :  without JVD/Nodes/TM/ nl carotid upstrokes bilaterally   LUNGS: no acc muscle use,  Distant bs, no wheeze and no cough on insp or exp  CV:  RRR  no s3 or murmur or increase in P2, no edema   ABD:  soft and nontender with nl inspiratory excursion in the supine position. No bruits or organomegaly, bowel sounds nl  MS:  Nl gait/ ext warm without deformities, calf tenderness, cyanosis or clubbing No obvious joint restrictions   SKIN: warm and dry without lesions    NEURO:  alert, approp, nl sensorium with  no motor deficits           Assessment & Plan:

## 2016-06-30 NOTE — Assessment & Plan Note (Signed)
-    quit smoking 2007 -  HC03 level 43 05/2009  -  PFT's 11/29/09 FEV1 0.77 (29%) with ratio 46 and 12% p B2 and DLCO 57  -  HC03  33  07/17/14  - 07/18/2015    change to spiriva respimat  - PFT's  07/22/15  1.18  FEV1 1.18 (49 % ) ratio 58  p 16 % improvement from saba p nothing prior to study with DLCO  43 % corrects to 76 % for alv volume    - 05/11/2016 added flutter valve   - 06/30/2016  After extensive coaching HFA effectiveness =    90% from baseline 75% (short Ti)    Group D in terms of symptom/risk and laba/lama/ICS  therefore appropriate rx at this point and doing much better on this combination plus gerd rx so should stay on same rx x 3 m insurance permitting    I had an extended discussion with the patient reviewing all relevant studies completed to date and  lasting 15 to 20 minutes of a 25 minute visit    Formulary restrictions will be an ongoing challenge for the forseable future and I would be happy to pick an alternative if the pt will first  provide me a list of them but pt  will need to return here for training for any new device that is required eg dpi vs hfa vs respimat.    In meantime we can always provide samples so the patient never runs out of any needed respiratory medications.   Each maintenance medication was reviewed in detail including most importantly the difference between maintenance and prns and under what circumstances the prns are to be triggered using an action plan format that is not reflected in the computer generated alphabetically organized AVS.    Please see AVS for specific instructions unique to this visit that I personally wrote and verbalized to the the pt in detail and then reviewed with pt  by my nurse highlighting any  changes in therapy recommended at today's visit to their plan of care.

## 2016-06-30 NOTE — Patient Instructions (Addendum)
Stay on same medications until next visit - call me with any requests to help you navigate the issue of which medications work best for you both in terms of how to use them and also which are most affordable and I'll do the best I can to help you  Work on inhaler technique:  relax and gently blow all the way out then take a nice smooth deep breath back in, triggering the inhaler at same time you start breathing in.  Hold for up to 5 seconds if you can. Blow out thru nose. Rinse and gargle with water when done  Please schedule a follow up visit in 3 months but call sooner if needed

## 2016-07-22 DIAGNOSIS — G4730 Sleep apnea, unspecified: Secondary | ICD-10-CM | POA: Diagnosis not present

## 2016-07-22 DIAGNOSIS — J301 Allergic rhinitis due to pollen: Secondary | ICD-10-CM | POA: Diagnosis not present

## 2016-07-22 DIAGNOSIS — Z0000 Encounter for general adult medical examination without abnormal findings: Secondary | ICD-10-CM | POA: Diagnosis not present

## 2016-07-22 DIAGNOSIS — Z1159 Encounter for screening for other viral diseases: Secondary | ICD-10-CM | POA: Diagnosis not present

## 2016-07-22 DIAGNOSIS — G8929 Other chronic pain: Secondary | ICD-10-CM | POA: Diagnosis not present

## 2016-07-22 DIAGNOSIS — M25512 Pain in left shoulder: Secondary | ICD-10-CM | POA: Diagnosis not present

## 2016-07-22 DIAGNOSIS — J449 Chronic obstructive pulmonary disease, unspecified: Secondary | ICD-10-CM | POA: Diagnosis not present

## 2016-07-22 DIAGNOSIS — E7800 Pure hypercholesterolemia, unspecified: Secondary | ICD-10-CM | POA: Diagnosis not present

## 2016-07-22 DIAGNOSIS — I5032 Chronic diastolic (congestive) heart failure: Secondary | ICD-10-CM | POA: Diagnosis not present

## 2016-08-24 ENCOUNTER — Other Ambulatory Visit: Payer: Self-pay | Admitting: Obstetrics and Gynecology

## 2016-08-24 ENCOUNTER — Other Ambulatory Visit: Payer: Self-pay | Admitting: Family Medicine

## 2016-08-24 DIAGNOSIS — Z1231 Encounter for screening mammogram for malignant neoplasm of breast: Secondary | ICD-10-CM

## 2016-08-26 DIAGNOSIS — H47292 Other optic atrophy, left eye: Secondary | ICD-10-CM | POA: Diagnosis not present

## 2016-09-09 DIAGNOSIS — H5203 Hypermetropia, bilateral: Secondary | ICD-10-CM | POA: Diagnosis not present

## 2016-09-09 DIAGNOSIS — J449 Chronic obstructive pulmonary disease, unspecified: Secondary | ICD-10-CM | POA: Diagnosis not present

## 2016-09-09 DIAGNOSIS — R0902 Hypoxemia: Secondary | ICD-10-CM | POA: Diagnosis not present

## 2016-09-09 DIAGNOSIS — H52209 Unspecified astigmatism, unspecified eye: Secondary | ICD-10-CM | POA: Diagnosis not present

## 2016-09-09 DIAGNOSIS — Z0100 Encounter for examination of eyes and vision without abnormal findings: Secondary | ICD-10-CM | POA: Diagnosis not present

## 2016-09-09 DIAGNOSIS — H524 Presbyopia: Secondary | ICD-10-CM | POA: Diagnosis not present

## 2016-09-10 ENCOUNTER — Telehealth: Payer: Self-pay | Admitting: *Deleted

## 2016-09-10 DIAGNOSIS — J9612 Chronic respiratory failure with hypercapnia: Secondary | ICD-10-CM

## 2016-09-10 NOTE — Telephone Encounter (Signed)
-----   Message from Tanda Rockers, MD sent at 09/10/2016  5:10 AM EDT ----- Regarding: FW: Nocturnal hypoxemia  Needs ono on RA  ----- Message ----- From: Marius Ditch, MD Sent: 09/09/2016   8:41 PM To: Tanda Rockers, MD Subject: Nocturnal hypoxemia                            Hi Ronalee Belts - Dr. Darcus Austin asked me to see Ms. Deakins because of the patient's and Donna's confusion over some sleep tests done in 2017. I saw her today and reviewed her studies in detail with her. Her PSG on 04/02/15 showed an AHI 4/hr, O2 min 80% with 2:36 hours below 88%. Although this degree of sleep disordered breathing is not considered OSA, she had a CPAP titration done 07/30/15 AHI 0/hr on CPAP, but her hypoxemia (<88%) > 2 hours. I told her that my conclusion was that she had insignificant sleep disordered breathing, and she has hypoxemia that probably needs treatment with nocturnal oxygen. I do not think she needs further sleep testing.  I told her that I would alert you to her nocturnal hypoxemia to see if you wanted to arrange for nocturnal oxygen. I suppose she might need an overnight oximetry again. Please let me know your thoughts about her situation. Thanks. Clair Gulling

## 2016-09-10 NOTE — Telephone Encounter (Signed)
Pt aware of recs from [facility] ordered

## 2016-09-16 ENCOUNTER — Encounter: Payer: Self-pay | Admitting: Internal Medicine

## 2016-09-18 DIAGNOSIS — J9612 Chronic respiratory failure with hypercapnia: Secondary | ICD-10-CM | POA: Diagnosis not present

## 2016-09-24 ENCOUNTER — Telehealth: Payer: Self-pay | Admitting: Internal Medicine

## 2016-09-24 DIAGNOSIS — J9612 Chronic respiratory failure with hypercapnia: Secondary | ICD-10-CM

## 2016-09-24 NOTE — Telephone Encounter (Signed)
Per MW- ONO on RA was pos for desat Needs to begin o2 2lpm and have repeat ONO on 2lpm  Spoke with pt and notified of results per Dr. Melvyn Novas. Pt verbalized understanding and denied any questions. Orders sent to [facility]

## 2016-09-24 NOTE — Telephone Encounter (Signed)
Called and spoke with pt. Pt is concerned about cost of oxygen. I have advised pt that this will need to be discussed with [place] and insurance. Pt also wanted to know if she should wait to start the oxygen until after up upcoming apt with MW on 09/30/16. I have made pt aware unless she had any further questions or concerns that she would like to discuss with MW concerning the O2, she could start the O2 at the time of delivery. Pt voiced her understanding and had no further questions. Nothing further needed.

## 2016-09-29 ENCOUNTER — Encounter: Payer: Self-pay | Admitting: Internal Medicine

## 2016-09-29 DIAGNOSIS — J9612 Chronic respiratory failure with hypercapnia: Secondary | ICD-10-CM | POA: Diagnosis not present

## 2016-09-30 ENCOUNTER — Encounter: Payer: Self-pay | Admitting: Internal Medicine

## 2016-09-30 ENCOUNTER — Ambulatory Visit (INDEPENDENT_AMBULATORY_CARE_PROVIDER_SITE_OTHER): Payer: Medicare HMO | Admitting: Internal Medicine

## 2016-09-30 VITALS — BP 112/60 | HR 66 | Ht 66.0 in | Wt 162.0 lb

## 2016-09-30 DIAGNOSIS — J9612 Chronic respiratory failure with hypercapnia: Secondary | ICD-10-CM | POA: Diagnosis not present

## 2016-09-30 DIAGNOSIS — Z23 Encounter for immunization: Secondary | ICD-10-CM | POA: Diagnosis not present

## 2016-09-30 DIAGNOSIS — J449 Chronic obstructive pulmonary disease, unspecified: Secondary | ICD-10-CM | POA: Diagnosis not present

## 2016-09-30 MED ORDER — TIOTROPIUM BROMIDE MONOHYDRATE 2.5 MCG/ACT IN AERS: INHALATION_SPRAY | RESPIRATORY_TRACT | 0 refills | Status: DC

## 2016-09-30 MED ORDER — BUDESONIDE-FORMOTEROL FUMARATE 160-4.5 MCG/ACT IN AERO: 2.0000 | INHALATION_SPRAY | Freq: Two times a day (BID) | RESPIRATORY_TRACT | 0 refills | Status: DC

## 2016-09-30 NOTE — Assessment & Plan Note (Signed)
HC03   07/17/2014  = 33  - PSS  ahi only 4 and cpap titration did not eliminate desats per Dr Nancee Liter message 09/10/2016 > rec ono RA next step - ONO RA  09/16/16  desat x 363 min > rec 09/24/2016 :  2lpm and repeat ono done 09/29/16 with sats < 89% x 4.3 min so no change rx = 2lpm hs only

## 2016-09-30 NOTE — Progress Notes (Signed)
Subjective:    Patient ID: Joanne Rogers, female    DOB: 1945/02/26     MRN: [id number]    Brief patient profile:  71   yowf quit smoking 2007  With documented copd  had been ok on spiriva but gradually worse since 2014   and some better since symbicort added fall 2016 referred to pulmonary clinic 07/18/2015 by Dr Inda Merlin for freq aecopd with baseline GOLD III  criteria.    History of Present Illness  07/18/2015 1st [facility] office visit/ Wert  rx spiriva dip/ symbicort hfa  Chief Complaint  Patient presents with  . PULMONARY CONSULT    Referred by Dr. Darcus Austin. Pt has not been seen since 2012 for COPD. Pt reports recent COPD flare that was treated with oral prednisone. Pt has improved. Pt c/o current SOB/wheeze and occasional CP/tightness. Pt does use albuterol HFA with some improvement in symptoms.   avg alb 2 x daily  MMRC2 = can't walk a nl pace on a flat grade s sob but does fine slow and flat ie indoor walking/shopping rec Plan A = Automatic = Symbicort 160 Take 2 puffs first thing in am and then another 2 puffs about 12 hours later and spiriva respimat 2 each am  Plan B = Backup Only use your albuterol as a rescue medication   Plan C = Crisis - only use your albuterol nebulizer if you first try Plan B    05/11/2016 acute extended ov/Wert re:  GOLD  III  On symb 160/ spiriva respimat Chief Complaint  Patient presents with  . Acute Visit    Bronchitis/COPD flare up. Increased SOB, coughing, chest congestion. Productive cough with green-yellow phlegm.   acutely ill x 6 week  7 weeks prior to ov needed avg albuterol twice daily now up to 4 X daily rec For cough > mucinex dm 1200 mg every 12 hours and use flutter valve as much as possible Augmentin 875 mg take one pill twice daily  X 10 days - take at breakfast and supper with large glass of water.  It would help reduce the usual side effects (diarrhea and yeast infections) if you ate cultured yogurt at lunch.    Prednisone 10 mg take  4 each am x 2 days,   2 each am x 2 days,  1 each am x 2 days and stop  Work on inhaler technique:        06/01/2016  f/u ov/Wert re:   GOLD III on symb 160 / spiriva  And "flare since early March - prednisone worked the best  Chief Complaint  Patient presents with  . Follow-up    Breathing has not improved and she is still coughing. Her cough is prod with clear sputum.   no excess mucus but waking up 3 x noct and in am with severe grinding upper airway cough and sensation of "mucus stuck in my throat".  Doe back to = MMRC1 = can walk nl pace, flat grade, can't hurry or go uphills or steps s sob   Still using duonbeb bid in addition to maint symb/spiriva  rec Plan A = Automatic =  Symbicort 160 Take 2 puffs first thing in am and then another 2 puffs about 12 hours later and Spiriva respimat 2 pffs each am  And finish prednisone x 6 days  Work on inhaler technique:  Plan B = Backup Only use your albuterol (blue gray/green = VENTOLIN) as a rescue medication Plan  C = Crisis - only use your albuterol nebulizer if you first try Plan B and it fails to help > ok to use the nebulizer up to every 4 hours but if start needing it regularly call for immediate appointment Pantoprazole (protonix) 40 mg   Take  30-60 min before first meal of the day and Pepcid (famotidine)  20 mg one @  bedtime until return to office - this is the best way to tell whether stomach acid is contributing to your problem.   GERD rx   06/30/2016  f/u ov/Wert re:  GOLD III  On symb/spiriva dpi and rare saba (ventolin = her green inhaler)  Chief Complaint  Patient presents with  . Follow-up    Pt denies having any complaints since last visit. Since last visit, for the first 2 weeks pt did have some coughing and SOB but since then things have been normal. Denies having any chest pain. No concerns with today's visit.   Doe = MMRC1 = can walk nl pace, flat grade, can't hurry or go uphills or steps s sob    Very confused with names of inhalers, wants respimat saba (informed not commercially available yet x as combivent, which is not approp when already on spiriva respimat)  rec Stay on same medications until next visit - call me with any requests to help you navigate the issue of which medications work best for you both in terms of how to use them and also which are most affordable and I'll do the best I can to help you Work on inhaler technique:   Please schedule a follow up visit in 3 months but call sooner if needed     09/30/2016  f/u ov/Wert re:  GOLD III/ symb / spiriva and 02 2lpm hs  Chief Complaint  Patient presents with  . Follow-up    Not coughing much but occ has some PND nd has to clear her throat.   avg 5 x monthly saba/ hfa still needs work Doe = MMRC1 = can walk nl pace, flat grade, can't hurry or go uphills or steps s sob   Sleeping fine on 02 2lpm s am ha   No obvious day to day or daytime variability or assoc excess/ purulent sputum or mucus plugs or hemoptysis or cp or chest tightness, subjective wheeze or overt  hb symptoms. No unusual exp hx or h/o childhood pna/ asthma or knowledge of premature birth.  Sleeping ok flat without nocturnal  or early am exacerbation  of respiratory  c/o's or need for noct saba. Also denies any obvious fluctuation of symptoms with weather or environmental changes or other aggravating or alleviating factors except as outlined above   Current Allergies, Complete Past Medical History, Past Surgical History, Family History, and Social History were reviewed in [employer] record.  ROS  The following are not active complaints unless bolded sore throat, dysphagia, dental problems, itching, sneezing,  nasal congestion or disharge of excess mucus or purulent secretions, ear ache,   fever, chills, sweats, unintended wt loss or wt gain, classically pleuritic or exertional cp,  orthopnea pnd or leg swelling, presyncope,  palpitations, abdominal pain, anorexia, nausea, vomiting, diarrhea  or change in bowel habits or bladder habits, change in stools or change in urine, dysuria, hematuria,  rash, arthralgias, visual complaints, headache, numbness, weakness or ataxia or problems with walking or coordination,  change in mood/affect or memory.        Current Meds  Medication  Sig  . albuterol (PROVENTIL) (2.5 MG/3ML) 0.083% nebulizer solution Take 3 mLs (2.5 mg total) by nebulization every 4 (four) hours as needed for wheezing or shortness of breath. DX: J44.9  . aspirin 81 MG tablet Take 81 mg by mouth at bedtime.   Marland Kitchen azelastine (ASTELIN) 0.1 % nasal spray Place 1 spray into both nostrils 2 (two) times daily. Use in each nostril as directed  . beta carotene w/minerals (OCUVITE) tablet Take 1 tablet by mouth at bedtime.  . bisoprolol (ZEBETA) 5 MG tablet Take 1 tablet (5 mg total) by mouth daily.  . budesonide-formoterol (SYMBICORT) 160-4.5 MCG/ACT inhaler Inhale 2 puffs into the lungs 2 (two) times daily.  . cetirizine (ZYRTEC) 10 MG tablet Take 10 mg by mouth at bedtime.  . Cholecalciferol (VITAMIN D3) 1000 units CAPS Take 1,000 Units by mouth 2 (two) times daily.  . famotidine (PEPCID) 20 MG tablet Take 20 mg by mouth at bedtime.  . Magnesium 250 MG TABS Take 1 tablet by mouth daily.  . OXYGEN 2lpm with sleep  . pantoprazole (PROTONIX) 40 MG tablet Take 40 mg by mouth daily.  Marland Kitchen Respiratory Therapy Supplies (FLUTTER) DEVI 1 Device by Does not apply route as needed.  . Tiotropium Bromide Monohydrate (SPIRIVA RESPIMAT) 2.5 MCG/ACT AERS INHALE 2 PUFFS EVERY DAY  . valACYclovir (VALTREX) 1000 MG tablet Take 500 mg by mouth every other day.   . VENTOLIN HFA 108 (90 BASE) MCG/ACT inhaler Inhale 1-2 puffs into the lungs every 4 (four) hours as needed for wheezing or shortness of breath.   . [DISCONTINUED] budesonide-formoterol (SYMBICORT) 160-4.5 MCG/ACT inhaler Inhale 2 puffs into the lungs 2 (two) times daily.  .  [DISCONTINUED] SPIRIVA RESPIMAT 2.5 MCG/ACT AERS INHALE 2 PUFFS EVERY DAY                                     .         Objective:   Physical Exam  amb wf nad    09/30/2016        162  06/30/2016        166  06/01/2016         163  05/11/2016        166  08/08/2015        168   07/18/15 168 lb 12.8 oz (76.567 kg)  02/19/15 168 lb (76.204 kg)  12/18/14 170 lb (77.111 kg)    Vital signs reviewed - - Note on arrival 02 sats  92% on RA     HEENT: nl dentition, turbinates, and oropharynx which is pristine. Nl external ear canals without cough reflex   NECK :  without JVD/Nodes/TM/ nl carotid upstrokes bilaterally   LUNGS: no acc muscle use,  Distant bs, no wheeze bilaterally  and no cough on insp or exp   CV:  RRR  no s3 or murmur or increase in P2, no edema   ABD:  soft and nontender with nl inspiratory excursion in the supine position. No bruits or organomegaly, bowel sounds nl  MS:  Nl gait/ ext warm without deformities, calf tenderness, cyanosis or clubbing No obvious joint restrictions   SKIN: warm and dry without lesions    NEURO:  alert, approp, nl sensorium with  no motor deficits           Assessment & Plan:

## 2016-09-30 NOTE — Patient Instructions (Addendum)
Ok to leave off the second dose of symbicort if doing well  Work on maintaining perfect inhaler technique:  relax and gently blow all the way out then take a nice smooth deep breath back in, triggering the inhaler at same time you start breathing in.  Hold for up to 5 seconds if you can. Blow out thru nose. Rinse and gargle with water when done       Please schedule a follow up visit in 6  months but call sooner if needed

## 2016-09-30 NOTE — Assessment & Plan Note (Signed)
-    quit smoking 2007 -  HC03 level 43 05/2009  -  PFT's 11/29/09 FEV1 0.77 (29%) with ratio 46 and 12% p B2 and DLCO 57  -  HC03  33  07/17/14  - 07/18/2015    change to spiriva respimat  - PFT's  07/22/15  1.18  FEV1 1.18 (49 % ) ratio 58  p 16 % improvement from saba p nothing prior to study with DLCO  43 % corrects to 76 % for alv volume    - 05/11/2016 added flutter valve    - 09/30/2016  After extensive coaching HFA effectiveness =    90%   She has enough AB to consider her   Group D in terms of symptom/risk and laba/lama/ICS  therefore appropriate rx at this point but if remains this level of improvement could consider just laba/lama per guidelines eg bevespi  I had an extended discussion with the patient reviewing all relevant studies completed to date and  lasting 15 to 20 minutes of a 25 minute visit on the following ongoing concerns:   Formulary restrictions will be an ongoing challenge for the forseable future and I would be happy to pick an alternative if the pt will first  provide me a list of them but pt  will need to return here for training for any new device that is required eg dpi vs hfa vs respimat.    In meantime we can always provide samples so the patient never runs out of any needed respiratory medications.      Each maintenance medication was reviewed in detail including most importantly the difference between maintenance and as needed and under what circumstances the prns are to be used.  Please see AVS for specific  Instructions which are unique to this visit and I personally typed out  which were reviewed in detail in writing with the patient and a copy provided.

## 2016-10-08 ENCOUNTER — Telehealth: Payer: Self-pay | Admitting: Internal Medicine

## 2016-10-08 ENCOUNTER — Ambulatory Visit: Payer: Medicare HMO | Admitting: Internal Medicine

## 2016-10-08 MED ORDER — AZITHROMYCIN 250 MG PO TABS: ORAL_TABLET | ORAL | 0 refills | Status: DC

## 2016-10-08 NOTE — Telephone Encounter (Signed)
z-pak 

## 2016-10-08 NOTE — Telephone Encounter (Signed)
Called and spoke with pt. Pt states after receiving her flu vaccine on  09/30/16 she developed headache, nasal drainage light green mucus & sore throat on Wednesday.  Pt taken mucinex & Tylenol with mild improvement.   MW please advise.     09/30/16 Patient Instructions    Ok to leave off the second dose of symbicort if doing well  Work on maintaining perfect inhaler technique:  relax and gently blow all the way out then take a nice smooth deep breath back in, triggering the inhaler at same time you start breathing in.  Hold for up to 5 seconds if you can. Blow out thru nose. Rinse and gargle with water when done       Please schedule a follow up visit in 6  months but call sooner if needed

## 2016-10-08 NOTE — Telephone Encounter (Signed)
Pt aware of recs.  rx sent to preferred pharmacy.  Nothing further needed.  

## 2016-10-09 ENCOUNTER — Ambulatory Visit
Admission: RE | Admit: 2016-10-09 | Discharge: 2016-10-09 | Disposition: A | Discharge status: Home / Self Care | Payer: Medicare HMO | Source: Ambulatory Visit | Attending: Family Medicine | Admitting: Family Medicine

## 2016-10-09 DIAGNOSIS — Z1231 Encounter for screening mammogram for malignant neoplasm of breast: Secondary | ICD-10-CM

## 2016-10-24 DIAGNOSIS — J9612 Chronic respiratory failure with hypercapnia: Secondary | ICD-10-CM | POA: Diagnosis not present

## 2016-11-23 DIAGNOSIS — Z85828 Personal history of other malignant neoplasm of skin: Secondary | ICD-10-CM | POA: Diagnosis not present

## 2016-11-23 DIAGNOSIS — L820 Inflamed seborrheic keratosis: Secondary | ICD-10-CM | POA: Diagnosis not present

## 2016-11-24 DIAGNOSIS — J9612 Chronic respiratory failure with hypercapnia: Secondary | ICD-10-CM | POA: Diagnosis not present

## 2016-12-09 ENCOUNTER — Telehealth: Payer: Self-pay | Admitting: Internal Medicine

## 2016-12-09 NOTE — Telephone Encounter (Signed)
It's not that simple and I have not found the need for the night time ventilation in 99.9% of my patients so I don't think she'll be one but happy to discuss this at next ov or refer to one of our non-invasive / sleep docs for a second opinion

## 2016-12-09 NOTE — Telephone Encounter (Signed)
Joanne Rogers of MW message. Nothing further is needed.

## 2016-12-09 NOTE — Telephone Encounter (Signed)
Spoke with pt, she is a current oxygen patient, and Marcha Solders is a respiratory therapist. He states she qualifies for Trilogy machine because her FEV1 is 47. MW are interested in getting her started on this? Please advise. You would have to do the things below:  Sign a order  Addend the note with a statement for the insurance He will do the rest of the paperwork

## 2016-12-11 ENCOUNTER — Telehealth: Payer: Self-pay | Admitting: Internal Medicine

## 2016-12-11 NOTE — Telephone Encounter (Signed)
Spoke with pt, advised there was no Spiriva samples and that she could call back Monday to check. She state sSpiriva is 400 dollars. Nothing further is needed.

## 2016-12-14 ENCOUNTER — Telehealth: Payer: Self-pay | Admitting: Internal Medicine

## 2016-12-14 NOTE — Telephone Encounter (Signed)
No samples available of the [place] with the pt and notified her of this  Nothing further needed

## 2016-12-24 DIAGNOSIS — J9612 Chronic respiratory failure with hypercapnia: Secondary | ICD-10-CM | POA: Diagnosis not present

## 2016-12-25 ENCOUNTER — Telehealth: Payer: Self-pay | Admitting: Internal Medicine

## 2016-12-25 NOTE — Telephone Encounter (Signed)
Spoke with pt, advised her that I left 2 samples to up front. Pt will come by to pick them up, nothing further is needed.

## 2017-01-19 ENCOUNTER — Other Ambulatory Visit: Payer: Self-pay | Admitting: Cardiovascular Disease

## 2017-01-22 ENCOUNTER — Ambulatory Visit: Payer: Medicare HMO | Admitting: Physician Assistant

## 2017-01-24 DIAGNOSIS — J9612 Chronic respiratory failure with hypercapnia: Secondary | ICD-10-CM | POA: Diagnosis not present

## 2017-01-29 ENCOUNTER — Ambulatory Visit: Payer: Medicare HMO | Admitting: Physician Assistant

## 2017-01-30 ENCOUNTER — Other Ambulatory Visit: Payer: Self-pay | Admitting: Internal Medicine

## 2017-02-15 DIAGNOSIS — L821 Other seborrheic keratosis: Secondary | ICD-10-CM | POA: Diagnosis not present

## 2017-02-15 DIAGNOSIS — L820 Inflamed seborrheic keratosis: Secondary | ICD-10-CM | POA: Diagnosis not present

## 2017-02-15 DIAGNOSIS — Z85828 Personal history of other malignant neoplasm of skin: Secondary | ICD-10-CM | POA: Diagnosis not present

## 2017-02-15 DIAGNOSIS — D170 Benign lipomatous neoplasm of skin and subcutaneous tissue of head, face and neck: Secondary | ICD-10-CM | POA: Diagnosis not present

## 2017-02-19 DIAGNOSIS — M25571 Pain in right ankle and joints of right foot: Secondary | ICD-10-CM | POA: Diagnosis not present

## 2017-02-24 DIAGNOSIS — J9612 Chronic respiratory failure with hypercapnia: Secondary | ICD-10-CM | POA: Diagnosis not present

## 2017-03-16 DIAGNOSIS — M25571 Pain in right ankle and joints of right foot: Secondary | ICD-10-CM | POA: Diagnosis not present

## 2017-03-24 DIAGNOSIS — J9612 Chronic respiratory failure with hypercapnia: Secondary | ICD-10-CM | POA: Diagnosis not present

## 2017-03-29 ENCOUNTER — Ambulatory Visit: Payer: Medicare HMO | Admitting: Cardiovascular Disease

## 2017-03-29 ENCOUNTER — Encounter: Payer: Self-pay | Admitting: Cardiovascular Disease

## 2017-03-29 VITALS — BP 120/66 | HR 62 | Ht 66.0 in | Wt 157.0 lb

## 2017-03-29 DIAGNOSIS — I5032 Chronic diastolic (congestive) heart failure: Secondary | ICD-10-CM

## 2017-03-29 NOTE — Patient Instructions (Signed)
Medication Instructions:  Your physician recommends that you continue on your current medications as directed. Please refer to the Current Medication list given to you today.   Labwork: None Ordered   Testing/Procedures: None Ordered   Follow-Up: Your physician wants you to follow-up in: 1 year with Dr. Nahser.  You will receive a reminder letter in the mail two months in advance. If you don't receive a letter, please call our office to schedule the follow-up appointment.   If you need a refill on your cardiac medications before your next appointment, please call your pharmacy.   Thank you for choosing [facility]! Stony Stegmann, RN [phone]    

## 2017-03-29 NOTE — Progress Notes (Signed)
Joanne Rogers Date of Birth  Jun 18, 1945 [facility] [address]    [place], [place]  [address] [phone]  Fax  [phone]  Problem List 1. Hyperlipidemia 2. Chronic lung disease,   Asthma  3. Chronic diastolic CHF 4.       Joanne Rogers is a 72 y.o. female with a hx of dyspnea.  She had a normal myoview in 2007.  She has a hx of hyperliidemia.   She had recent lipids but they are not available at this time.  She has been diagnosed with COPD.  She denies any chest pain.  She had some chest wall / breast pain during there last mamogram on July.  She is exercising 2-3 times a week.  No CP or dyspnea.  Dec. 2, 5176:   Joanne Rogers is doing well.  Riding bike and exercising regularly.   Looking for a new job.  No CP or palps  Dec. 3, 1607: Joanne Rogers is doing ok.  No CP,  She has chronic shortness of breath. Has bad allergies. She has normal LV function by echo in 2007.    Dec. 6, 2016:  Doing ok from cardiac standpoint.  Has gained some weight.  Exercising regularly  breathin is ok  Dec. 7, 3710:  Joanne Rogers is seen today for follow up of her shortness of breath. She has normal left ventricle systolic function by echo. She was found have grade 1 diastolic dysfunction. Just getting over bronchitis - is still short of breath .   March 30, 6267:   Joanne Rogers is seen today for follow up of her chronic diastolic congestive heart failure. Echocardiogram from December, 2015 reveals normal left ventricular systolic function.  She has grade 1 diastolic dysfunction.  She fell 5 weeks ago and broke her right fibula.    Is wearing a small brace now   she has lost some weight   Current Outpatient Medications on File Prior to Visit  Medication Sig Dispense Refill  . albuterol (PROVENTIL) (2.5 MG/3ML) 0.083% nebulizer solution Take 3 mLs (2.5 mg total) by nebulization every 4 (four) hours as needed for wheezing or shortness of breath. DX: J44.9 75 mL 11  . aspirin 81 MG tablet Take 81 mg by  mouth at bedtime.     Marland Kitchen azelastine (ASTELIN) 0.1 % nasal spray Place 1 spray into both nostrils 2 (two) times daily. Use in each nostril as directed    . beta carotene w/minerals (OCUVITE) tablet Take 1 tablet by mouth at bedtime.    . bisoprolol (ZEBETA) 5 MG tablet Take 5 mg by mouth daily.    . budesonide-formoterol (SYMBICORT) 160-4.5 MCG/ACT inhaler Inhale 2 puffs into the lungs 2 (two) times daily. 1 Inhaler 0  . cetirizine (ZYRTEC) 10 MG tablet Take 10 mg by mouth at bedtime.    . Cholecalciferol (VITAMIN D3) 1000 units CAPS Take 1,000 Units by mouth 2 (two) times daily.    . famotidine (PEPCID) 20 MG tablet Take 20 mg by mouth at bedtime.    Marland Kitchen ipratropium-albuterol (DUONEB) 0.5-2.5 (3) MG/3ML SOLN Take 3 mLs by nebulization every 2 (two) hours as needed (SHORT OF BREATH).    . Magnesium 250 MG TABS Take 1 tablet by mouth daily.    . OXYGEN 2lpm with sleep    . pantoprazole (PROTONIX) 40 MG tablet Take 40 mg by mouth daily.    Marland Kitchen Respiratory Therapy Supplies (FLUTTER) DEVI 1 Device by Does not apply route as needed. 1 each 0  .  Tiotropium Bromide Monohydrate (SPIRIVA RESPIMAT) 2.5 MCG/ACT AERS Inhale 2 puffs into the lungs daily.    . valACYclovir (VALTREX) 1000 MG tablet Take 500 mg by mouth every other day.     . VENTOLIN HFA 108 (90 BASE) MCG/ACT inhaler Inhale 1-2 puffs into the lungs every 4 (four) hours as needed for wheezing or shortness of breath.   1   No current facility-administered medications on file prior to visit.     Allergies  Allergen Reactions  . Amoxicillin Nausea And Vomiting  . Codeine     REACTION: hives  . Crestor [Rosuvastatin Calcium]     Ache, depression.   . Levofloxacin Nausea Only  . Lovastatin Other (See Comments)    Ache, depression.   . Simvastatin Other (See Comments)    Ache, depression.   . Statins Other (See Comments)    Muscle aches  . Latex Hives and Rash    Past Medical History:  Diagnosis Date  . Arthritis    back, hands, neck  .  COPD (chronic obstructive pulmonary disease) ([place])   . DDD (degenerative disc disease), lumbar   . Dyspnea   . H/O bronchitis    allergy related  . Hypercholesterolemia   . Palpitations    "fluttering"  . Pneumonia    hx of    Past Surgical History:  Procedure Laterality Date  . CARDIOVASCULAR STRESS TEST  11/16/2005   EF 70%, NO ISCHEMIA  . CARPAL TUNNEL RELEASE Right ~1988  . DILATION AND CURETTAGE OF UTERUS     x3: x2 for miscarrages and x1 for polyp  . HEMI-MICRODISCECTOMY LUMBAR LAMINECTOMY LEVEL 1 Left 07/20/2014   Procedure: centeral decompression,L4-L5, HEMI-LAMINECTOMY MICRODISCECTOMY L4 - L5 ON THE LEFT  LEVEL 1, forimatomy 4-5 root on the left;  Surgeon: Latanya Maudlin, MD;  Location: [facility];  Service: Orthopedics;  Laterality: Left;  . US ECHOCARDIOGRAPHY  11/19/2005   EF 55-60%    Social History   Tobacco Use  Smoking Status Former Smoker  . Packs/day: 0.50  . Years: 25.00  . Pack years: 12.50  . Types: Cigarettes  . Last attempt to quit: 05/16/2005  . Years since quitting: 11.8  Smokeless Tobacco Never Used    Social History   Substance and Sexual Activity  Alcohol Use Yes  . Alcohol/week: 3.6 oz  . Types: 6 Standard drinks or equivalent per week   Comment: social    Family History  Problem Relation Age of Onset  . Breast cancer Mother 27  . Emphysema Maternal Grandfather     Reviw of Systems:  Reviewed in the HPI.  All other systems are negative.  Physical Exam: Blood pressure 120/66, pulse 62, height 5\' 6"  (1.676 m), weight 157 lb (71.2 kg).  GEN:  Well nourished, well developed in no acute distress HEENT: Normal NECK: No JVD; No carotid bruits LYMPHATICS: No lymphadenopathy CARDIAC: RR , distant HR  RESPIRATORY:  Clear to auscultation without rales, wheezing or rhonchi  ABDOMEN: Soft, non-tender, non-distended MUSCULOSKELETAL:  No edema; No deformity  SKIN: Warm and dry NEUROLOGIC:  Alert and oriented x 3   ECG:  NSR at 70.    Normal  ecg.  Assessment / Plan:   1. Mild chronic diastolic congestive heart failure:   She is very stable.  Continue Bystlic .   Breathing has improved.   2. Hyperlipidemia:  Managed by primary MD   3. Bronchitis / COPD -  sees Wert.   I'll see her again in one  year.   Mertie Moores, MD  03/29/2017 9:41 AM    [place],  [place], [place]  [address] Pager [phone] Phone: [phone]; Fax: [phone]

## 2017-03-30 ENCOUNTER — Ambulatory Visit: Payer: Medicare HMO | Admitting: Internal Medicine

## 2017-04-06 ENCOUNTER — Encounter: Payer: Self-pay | Admitting: Internal Medicine

## 2017-04-06 ENCOUNTER — Ambulatory Visit: Payer: Medicare HMO | Admitting: Internal Medicine

## 2017-04-06 ENCOUNTER — Other Ambulatory Visit: Payer: Medicare HMO

## 2017-04-06 VITALS — BP 104/68 | HR 73 | Ht 66.0 in | Wt 158.6 lb

## 2017-04-06 DIAGNOSIS — J9611 Chronic respiratory failure with hypoxia: Secondary | ICD-10-CM

## 2017-04-06 DIAGNOSIS — J449 Chronic obstructive pulmonary disease, unspecified: Secondary | ICD-10-CM | POA: Diagnosis not present

## 2017-04-06 DIAGNOSIS — J9612 Chronic respiratory failure with hypercapnia: Secondary | ICD-10-CM | POA: Diagnosis not present

## 2017-04-06 MED ORDER — BUDESONIDE-FORMOTEROL FUMARATE 160-4.5 MCG/ACT IN AERO: 2.0000 | INHALATION_SPRAY | Freq: Two times a day (BID) | RESPIRATORY_TRACT | 0 refills | Status: DC

## 2017-04-06 MED ORDER — TIOTROPIUM BROMIDE MONOHYDRATE 2.5 MCG/ACT IN AERS: 2.0000 | INHALATION_SPRAY | Freq: Every day | RESPIRATORY_TRACT | 0 refills | Status: DC

## 2017-04-06 NOTE — Patient Instructions (Addendum)
Ok to try  taking pepcid 20 mg after bfast and supper and stop pantoprazole   If not able to stop pantoprazole then call for referral to GI   Please remember to go to the lab department downstairs in the basement  for your tests - we will call you with the results when they are available.       Please schedule a follow up visit in 6  months but call sooner if needed

## 2017-04-06 NOTE — Progress Notes (Signed)
Subjective:   Patient ID: Joanne Rogers, female    DOB: April 28, 1945     MRN: [id number]    Brief patient profile:  36 yowf  MZ  quit smoking 2007  With documented copd  had been ok on spiriva but gradually worse since 2014   and some better since symbicort added fall 2016 referred to pulmonary clinic 07/18/2015 by Dr Inda Merlin for freq aecopd with baseline GOLD III  criteria.    History of Present Illness  07/18/2015 1st [facility] office visit/ Joanne Rogers  rx spiriva dip/ symbicort hfa  Chief Complaint  Patient presents with  . PULMONARY CONSULT    Referred by Dr. Darcus Austin. Pt has not been seen since 2012 for COPD. Pt reports recent COPD flare that was treated with oral prednisone. Pt has improved. Pt c/o current SOB/wheeze and occasional CP/tightness. Pt does use albuterol HFA with some improvement in symptoms.   avg alb 2 x daily  MMRC2 = can't walk a nl pace on a flat grade s sob but does fine slow and flat ie indoor walking/shopping rec Plan A = Automatic = Symbicort 160 Take 2 puffs first thing in am and then another 2 puffs about 12 hours later and spiriva respimat 2 each am  Plan B = Backup Only use your albuterol as a rescue medication   Plan C = Crisis - only use your albuterol nebulizer if you first try Plan B    05/11/2016 acute extended ov/Joanne Rogers re:  GOLD  III  On symb 160/ spiriva respimat Chief Complaint  Patient presents with  . Acute Visit    Bronchitis/COPD flare up. Increased SOB, coughing, chest congestion. Productive cough with green-yellow phlegm.   acutely ill x 6 week  7 weeks prior to ov needed avg albuterol twice daily now up to 4 X daily rec For cough > mucinex dm 1200 mg every 12 hours and use flutter valve as much as possible Augmentin 875 mg take one pill twice daily  X 10 days - take at breakfast and supper with large glass of water.  It would help reduce the usual side effects (diarrhea and yeast infections) if you ate cultured yogurt at lunch.    Prednisone 10 mg take  4 each am x 2 days,   2 each am x 2 days,  1 each am x 2 days and stop  Work on inhaler technique:        06/01/2016  f/u ov/Joanne Rogers re:   GOLD III on symb 160 / spiriva  And "flare since early March - prednisone worked the best  Chief Complaint  Patient presents with  . Follow-up    Breathing has not improved and she is still coughing. Her cough is prod with clear sputum.   no excess mucus but waking up 3 x noct and in am with severe grinding upper airway cough and sensation of "mucus stuck in my throat".  Doe back to = MMRC1 = can walk nl pace, flat grade, can't hurry or go uphills or steps s sob   Still using duonbeb bid in addition to maint symb/spiriva  rec Plan A = Automatic =  Symbicort 160 Take 2 puffs first thing in am and then another 2 puffs about 12 hours later and Spiriva respimat 2 pffs each am  And finish prednisone x 6 days  Work on inhaler technique:  Plan B = Backup Only use your albuterol (blue gray/green = VENTOLIN) as a rescue medication Plan  C = Crisis - only use your albuterol nebulizer if you first try Plan B and it fails to help > ok to use the nebulizer up to every 4 hours but if start needing it regularly call for immediate appointment Pantoprazole (protonix) 40 mg   Take  30-60 min before first meal of the day and Pepcid (famotidine)  20 mg one @  bedtime until return to office - this is the best way to tell whether stomach acid is contributing to your problem.   GERD rx     04/06/2017  f/u ov/Joanne Rogers re:  COPD GOLD III/ sym/spiriva and 02 at hs  Chief Complaint  Patient presents with  . Follow-up    Breathing is overall doing well. She uses her albuterol inhaler 4 x per wk on average and has not needed neb.   Dyspnea:  MMRC1 = can walk nl pace, flat grade, can't hurry or go uphills or steps s sob  / slowed by R fibula fx  Cough: resolved on ppi / off chocolate  Sleep: fine on 02 2lpm    No obvious day to day or daytime variability or  assoc excess/ purulent sputum or mucus plugs or hemoptysis or cp or chest tightness, subjective wheeze or overt sinus or hb symptoms. No unusual exposure hx or h/o childhood pna/ asthma or knowledge of premature birth.  Sleeping ok flat without nocturnal  or early am exacerbation  of respiratory  c/o's or need for noct saba. Also denies any obvious fluctuation of symptoms with weather or environmental changes or other aggravating or alleviating factors except as outlined above   Current Allergies, Complete Past Medical History, Past Surgical History, Family History, and Social History were reviewed in [employer] record.  ROS  The following are not active complaints unless bolded Hoarseness, sore throat, dysphagia, dental problems, itching, sneezing,  nasal congestion or discharge of excess mucus or purulent secretions, ear ache,   fever, chills, sweats, unintended wt loss or wt gain, classically pleuritic or exertional cp,  orthopnea pnd or leg swelling, presyncope, palpitations, abdominal pain, anorexia, nausea, vomiting, diarrhea  or change in bowel habits or change in bladder habits, change in stools or change in urine, dysuria, hematuria,  rash, arthralgias, visual complaints, headache, numbness, weakness or ataxia or problems with walking or coordination,  change in mood/affect or memory.        Current Meds  Medication Sig  . albuterol (PROVENTIL) (2.5 MG/3ML) 0.083% nebulizer solution Take 3 mLs (2.5 mg total) by nebulization every 4 (four) hours as needed for wheezing or shortness of breath. DX: J44.9  . aspirin 81 MG tablet Take 81 mg by mouth at bedtime.   . beta carotene w/minerals (OCUVITE) tablet Take 1 tablet by mouth at bedtime.  . bisoprolol (ZEBETA) 5 MG tablet Take 5 mg by mouth daily.  . budesonide-formoterol (SYMBICORT) 160-4.5 MCG/ACT inhaler Inhale 2 puffs into the lungs 2 (two) times daily.  . Calcium Carbonate-Vit D-Min (GNP CALCIUM PLUS 600 +D PO)  Take 1 tablet by mouth daily.  . cetirizine (ZYRTEC) 10 MG tablet Take 10 mg by mouth at bedtime.  . famotidine (PEPCID) 20 MG tablet Take 20 mg by mouth at bedtime.  Marland Kitchen ipratropium-albuterol (DUONEB) 0.5-2.5 (3) MG/3ML SOLN Take 3 mLs by nebulization every 2 (two) hours as needed (SHORT OF BREATH).  . Multiple Minerals-Vitamins (CAL-MAG-ZINC-D PO) daily.  . OXYGEN 2lpm with sleep  . pantoprazole (PROTONIX) 40 MG tablet Take 40 mg by mouth daily.  Marland Kitchen  Respiratory Therapy Supplies (FLUTTER) DEVI 1 Device by Does not apply route as needed.  . Tiotropium Bromide Monohydrate (SPIRIVA RESPIMAT) 2.5 MCG/ACT AERS Inhale 2 puffs into the lungs daily.  . valACYclovir (VALTREX) 1000 MG tablet Take 500 mg by mouth every other day.   . VENTOLIN HFA 108 (90 BASE) MCG/ACT inhaler Inhale 1-2 puffs into the lungs every 4 (four) hours as needed for wheezing or shortness of breath.   .     .                            .         Objective:   Physical Exam   amb wf nad  04/06/2017          159  09/30/2016        162  06/30/2016        166  06/01/2016         163  05/11/2016        166  08/08/2015        168   07/18/15 168 lb 12.8 oz (76.567 kg)  02/19/15 168 lb (76.204 kg)  12/18/14 170 lb (77.111 kg)     Vital signs reviewed - Note on arrival 02 sats  94% on RA     HEENT: nl dentition / oropharynx. Nl external ear canals without cough reflex - mild bilateral non-specific turbinate edema     NECK :  without JVD/Nodes/TM/ nl carotid upstrokes bilaterally   LUNGS: no acc muscle use,  Mild barrel  contour chest wall with bilateral  Distant bs s audible wheeze and  without cough on insp or exp maneuver and mild   Hyperresonant  to  percussion bilaterally     CV:  RRR  no s3 or murmur or increase in P2, and no edema   ABD:  soft and nontender with pos late insp Hoover's  in the supine position. No bruits or organomegaly appreciated, bowel sounds nl  MS:   Nl gait/  ext warm without  deformities, calf tenderness, cyanosis or clubbing No obvious joint restrictions   SKIN: warm and dry without lesions    NEURO:  alert, approp, nl sensorium with  no motor or cerebellar deficits apparent.               Assessment & Plan:

## 2017-04-10 LAB — ALPHA-1 ANTITRYPSIN PHENOTYPE: A-1 Antitrypsin, Ser: 95 mg/dL (ref 83–199)

## 2017-04-12 ENCOUNTER — Encounter: Payer: Self-pay | Admitting: Internal Medicine

## 2017-04-12 NOTE — Assessment & Plan Note (Signed)
HC03   07/17/2014  = 33  - PSS  ahi only 4 and cpap titration did not eliminate desats per Dr Nancee Liter message 09/10/2016 > rec ono RA next step - ONO RA  09/16/16  desat x 363 min > rec 09/24/2016 :  2lpm and repeat ono done 09/29/16 with sats < 89% x 4.3 min so no change rx   Needs 02 hs only for now

## 2017-04-12 NOTE — Progress Notes (Signed)
Spoke with pt and notified of results per Dr. Wert. Pt verbalized understanding and denied any questions. 

## 2017-04-12 NOTE — Assessment & Plan Note (Signed)
-    quit smoking 2007 -  HC03 level 43 05/2009  -  PFT's 11/29/09 FEV1 0.77 (29%) with ratio 46 and 12% p B2 and DLCO 57  -  HC03  33  07/17/14  - 07/18/2015    change to spiriva respimat  - PFT's  07/22/15  1.18  FEV1 1.18 (49 % ) ratio 58  p 16 % improvement from saba p nothing prior to study with DLCO  43 % corrects to 76 % for alv volume    - 05/11/2016 added flutter valve  - 09/30/2016  After extensive coaching HFA effectiveness =    90%  - alpha one AT  04/06/2017   MZ   Level 95     Group D in terms of symptom/risk and laba/lama/ICS  therefore appropriate rx at this point.  Informed of MZ status and will need repeat pfts on return to see if off cigs her pfts remain relatively stable or whether need consideration for Alpha one replacement (strongly doubt it)   Each maintenance medication was reviewed in detail including most importantly the difference between maintenance and as needed and under what circumstances the prns are to be used.  Please see AVS for specific  Instructions which are unique to this visit and I personally typed out  which were reviewed in detail in writing with the patient and a copy provided.

## 2017-04-13 ENCOUNTER — Telehealth: Payer: Self-pay | Admitting: Internal Medicine

## 2017-04-13 NOTE — Telephone Encounter (Signed)
Called and spoke with patient, she states that she is wanting something in writing that explains what she was tested for and the type of test it was. She is also wanting it to explain exactly what she has and why her family needs to be tested as well. Patient states that she understands she had results read out to her but she wants something in writing that is more sufficient. I advised patient that I could print the results out and she denied.    MW please advise on this, thanks.

## 2017-04-14 NOTE — Telephone Encounter (Signed)
Discussed by phone, referred to internet sites at search  MZ phenotype (none liver material as that is not relevant here)

## 2017-04-24 DIAGNOSIS — J9612 Chronic respiratory failure with hypercapnia: Secondary | ICD-10-CM | POA: Diagnosis not present

## 2017-05-07 ENCOUNTER — Other Ambulatory Visit: Payer: Self-pay | Admitting: Cardiovascular Disease

## 2017-05-24 DIAGNOSIS — J9612 Chronic respiratory failure with hypercapnia: Secondary | ICD-10-CM | POA: Diagnosis not present

## 2017-06-02 DIAGNOSIS — R233 Spontaneous ecchymoses: Secondary | ICD-10-CM | POA: Diagnosis not present

## 2017-06-16 DIAGNOSIS — R8290 Unspecified abnormal findings in urine: Secondary | ICD-10-CM | POA: Diagnosis not present

## 2017-06-16 DIAGNOSIS — Z01419 Encounter for gynecological examination (general) (routine) without abnormal findings: Secondary | ICD-10-CM | POA: Diagnosis not present

## 2017-06-16 DIAGNOSIS — Z780 Asymptomatic menopausal state: Secondary | ICD-10-CM | POA: Diagnosis not present

## 2017-06-16 DIAGNOSIS — Z1389 Encounter for screening for other disorder: Secondary | ICD-10-CM | POA: Diagnosis not present

## 2017-06-16 DIAGNOSIS — Z6824 Body mass index (BMI) 24.0-24.9, adult: Secondary | ICD-10-CM | POA: Diagnosis not present

## 2017-06-16 DIAGNOSIS — Z130 Encounter for screening for diseases of the blood and blood-forming organs and certain disorders involving the immune mechanism: Secondary | ICD-10-CM | POA: Diagnosis not present

## 2017-06-24 DIAGNOSIS — J9612 Chronic respiratory failure with hypercapnia: Secondary | ICD-10-CM | POA: Diagnosis not present

## 2017-07-02 DIAGNOSIS — S40861A Insect bite (nonvenomous) of right upper arm, initial encounter: Secondary | ICD-10-CM | POA: Diagnosis not present

## 2017-07-20 ENCOUNTER — Telehealth: Payer: Self-pay | Admitting: Internal Medicine

## 2017-07-20 MED ORDER — TIOTROPIUM BROMIDE MONOHYDRATE 2.5 MCG/ACT IN AERS: 2.0000 | INHALATION_SPRAY | Freq: Every day | RESPIRATORY_TRACT | 0 refills | Status: DC

## 2017-07-20 MED ORDER — TIOTROPIUM BROMIDE MONOHYDRATE 2.5 MCG/ACT IN AERS: 2.0000 | INHALATION_SPRAY | Freq: Every day | RESPIRATORY_TRACT | 3 refills | Status: DC

## 2017-07-20 NOTE — Telephone Encounter (Signed)
Called and spoke with pt letting her know we had samples of Spiriva that I was placing up front for her. Pt expressed understanding.  Due to not having a Rx on file for pt, stated to her we should also send a Rx to her pharmacy just in case she was to the point she needed another inhaler and if we did not have any samples, she could get it from her pharmacy.  Pt expressed understanding. Pt stated she wanted the script to be sent to [facility]. Sent script to [facility] from pt and have placed samples up front for pt. Nothing further needed.

## 2017-07-24 DIAGNOSIS — J9612 Chronic respiratory failure with hypercapnia: Secondary | ICD-10-CM | POA: Diagnosis not present

## 2017-08-09 DIAGNOSIS — E7800 Pure hypercholesterolemia, unspecified: Secondary | ICD-10-CM | POA: Diagnosis not present

## 2017-08-09 DIAGNOSIS — D179 Benign lipomatous neoplasm, unspecified: Secondary | ICD-10-CM | POA: Diagnosis not present

## 2017-08-09 DIAGNOSIS — J9612 Chronic respiratory failure with hypercapnia: Secondary | ICD-10-CM | POA: Diagnosis not present

## 2017-08-09 DIAGNOSIS — M8580 Other specified disorders of bone density and structure, unspecified site: Secondary | ICD-10-CM | POA: Diagnosis not present

## 2017-08-09 DIAGNOSIS — Z0000 Encounter for general adult medical examination without abnormal findings: Secondary | ICD-10-CM | POA: Diagnosis not present

## 2017-08-09 DIAGNOSIS — J449 Chronic obstructive pulmonary disease, unspecified: Secondary | ICD-10-CM | POA: Diagnosis not present

## 2017-08-09 DIAGNOSIS — H6120 Impacted cerumen, unspecified ear: Secondary | ICD-10-CM | POA: Diagnosis not present

## 2017-08-09 DIAGNOSIS — I5032 Chronic diastolic (congestive) heart failure: Secondary | ICD-10-CM | POA: Diagnosis not present

## 2017-08-09 DIAGNOSIS — I700 Atherosclerosis of aorta: Secondary | ICD-10-CM | POA: Diagnosis not present

## 2017-08-14 ENCOUNTER — Other Ambulatory Visit: Payer: Self-pay | Admitting: Family Medicine

## 2017-08-14 DIAGNOSIS — M8580 Other specified disorders of bone density and structure, unspecified site: Secondary | ICD-10-CM

## 2017-08-19 ENCOUNTER — Other Ambulatory Visit: Payer: Self-pay | Admitting: Family Medicine

## 2017-08-19 DIAGNOSIS — Z1231 Encounter for screening mammogram for malignant neoplasm of breast: Secondary | ICD-10-CM

## 2017-08-24 DIAGNOSIS — J9612 Chronic respiratory failure with hypercapnia: Secondary | ICD-10-CM | POA: Diagnosis not present

## 2017-09-15 DIAGNOSIS — H47292 Other optic atrophy, left eye: Secondary | ICD-10-CM | POA: Diagnosis not present

## 2017-09-24 DIAGNOSIS — J9612 Chronic respiratory failure with hypercapnia: Secondary | ICD-10-CM | POA: Diagnosis not present

## 2017-10-07 ENCOUNTER — Ambulatory Visit: Payer: Medicare HMO | Admitting: Internal Medicine

## 2017-10-07 ENCOUNTER — Ambulatory Visit (INDEPENDENT_AMBULATORY_CARE_PROVIDER_SITE_OTHER)
Admission: RE | Admit: 2017-10-07 | Discharge: 2017-10-07 | Disposition: A | Discharge status: Home / Self Care | Payer: Medicare HMO | Source: Ambulatory Visit | Attending: Internal Medicine | Admitting: Internal Medicine

## 2017-10-07 ENCOUNTER — Ambulatory Visit: Payer: Medicare HMO

## 2017-10-07 ENCOUNTER — Encounter: Payer: Self-pay | Admitting: Internal Medicine

## 2017-10-07 VITALS — BP 110/60 | HR 72 | Ht 65.5 in | Wt 159.0 lb

## 2017-10-07 DIAGNOSIS — R0989 Other specified symptoms and signs involving the circulatory and respiratory systems: Secondary | ICD-10-CM | POA: Diagnosis not present

## 2017-10-07 DIAGNOSIS — J9612 Chronic respiratory failure with hypercapnia: Secondary | ICD-10-CM | POA: Diagnosis not present

## 2017-10-07 DIAGNOSIS — J449 Chronic obstructive pulmonary disease, unspecified: Secondary | ICD-10-CM | POA: Diagnosis not present

## 2017-10-07 DIAGNOSIS — Z23 Encounter for immunization: Secondary | ICD-10-CM

## 2017-10-07 DIAGNOSIS — J9611 Chronic respiratory failure with hypoxia: Secondary | ICD-10-CM

## 2017-10-07 NOTE — Progress Notes (Signed)
Subjective:   Patient ID: Joanne Rogers, female    DOB: January 20, 1945     MRN: [id number]    Brief patient profile:  36 yowf  MZ  quit smoking 2007  With documented copd  had been ok on spiriva but gradually worse since 2014   and some better since symbicort added fall 2016 referred to pulmonary clinic 07/18/2015 by Dr Inda Merlin for freq aecopd with baseline GOLD III  criteria.    History of Present Illness  07/18/2015 1st [facility] office visit/ Joanne Rogers  rx spiriva dip/ symbicort hfa  Chief Complaint  Patient presents with  . PULMONARY CONSULT    Referred by Dr. Darcus Austin. Pt has not been seen since 2012 for COPD. Pt reports recent COPD flare that was treated with oral prednisone. Pt has improved. Pt c/o current SOB/wheeze and occasional CP/tightness. Pt does use albuterol HFA with some improvement in symptoms.   avg alb 2 x daily  MMRC2 = can't walk a nl pace on a flat grade s sob but does fine slow and flat ie indoor walking/shopping rec Plan A = Automatic = Symbicort 160 Take 2 puffs first thing in am and then another 2 puffs about 12 hours later and spiriva respimat 2 each am  Plan B = Backup Only use your albuterol as a rescue medication   Plan C = Crisis - only use your albuterol nebulizer if you first try Plan B    05/11/2016 acute extended ov/Joanne Rogers re:  GOLD  III  On symb 160/ spiriva respimat Chief Complaint  Patient presents with  . Acute Visit    Bronchitis/COPD flare up. Increased SOB, coughing, chest congestion. Productive cough with green-yellow phlegm.   acutely ill x 6 week  7 weeks prior to ov needed avg albuterol twice daily now up to 4 X daily rec For cough > mucinex dm 1200 mg every 12 hours and use flutter valve as much as possible Augmentin 875 mg take one pill twice daily  X 10 days - take at breakfast and supper with large glass of water.  It would help reduce the usual side effects (diarrhea and yeast infections) if you ate cultured yogurt at lunch.    Prednisone 10 mg take  4 each am x 2 days,   2 each am x 2 days,  1 each am x 2 days and stop  Work on inhaler technique:        06/01/2016  f/u ov/Joanne Rogers re:   GOLD III on symb 160 / spiriva  And "flare since early March - prednisone worked the best  Chief Complaint  Patient presents with  . Follow-up    Breathing has not improved and she is still coughing. Her cough is prod with clear sputum.   no excess mucus but waking up 3 x noct and in am with severe grinding upper airway cough and sensation of "mucus stuck in my throat".  Doe back to = MMRC1 = can walk nl pace, flat grade, can't hurry or go uphills or steps s sob   Still using duonbeb bid in addition to maint symb/spiriva  rec Plan A = Automatic =  Symbicort 160 Take 2 puffs first thing in am and then another 2 puffs about 12 hours later and Spiriva respimat 2 pffs each am  And finish prednisone x 6 days  Work on inhaler technique:  Plan B = Backup Only use your albuterol (blue gray/green = VENTOLIN) as a rescue medication Plan  C = Crisis - only use your albuterol nebulizer if you first try Plan B and it fails to help > ok to use the nebulizer up to every 4 hours but if start needing it regularly call for immediate appointment Pantoprazole (protonix) 40 mg   Take  30-60 min before first meal of the day and Pepcid (famotidine)  20 mg one @  bedtime until return to office - this is the best way to tell whether stomach acid is contributing to your problem.   GERD rx         10/07/2017  f/u ov/Joanne Rogers re: COPD GOLD II symb/spiriva  And 02 just at hs / struggling with timing of maint rx  Chief Complaint  Patient presents with  . Follow-up    Breathing is overall doing well. She gets SOB when she gets in a rush and when she walks up stairs. She rarely uses her albuterol- unless she goes a night without her o2 and then she uses it about 2 x per day. She has not needed neb recently.   Dyspnea:  MMRC1 = can walk nl pace, flat grade, can't  hurry or go uphills or steps s sob   Cough: minimal am golb  Sleeping: flat bed 1 pillow SABA use: typically in pm / never neb  02: 2lpm  Shoulder pains x sev years when "over does it"  happens maybe once a week mostly when using shoulder eg washing dishes and never with walking / not positional though she's not really sure, helps to rest.  No obvious day to day or daytime variability or assoc excess/ purulent sputum or mucus plugs or hemoptysis or cp or chest tightness, subjective wheeze or overt sinus or hb symptoms.   Sleeping as above  without nocturnal  or early am exacerbation  of respiratory  c/o's or need for noct saba. Also denies any obvious fluctuation of symptoms with weather or environmental changes or other aggravating or alleviating factors except as outlined above   No unusual exposure hx or h/o childhood pna/ asthma or knowledge of premature birth.  Current Allergies, Complete Past Medical History, Past Surgical History, Family History, and Social History were reviewed in [employer] record.  ROS  The following are not active complaints unless bolded Hoarseness, sore throat, dysphagia, dental problems, itching, sneezing,  nasal congestion or discharge of excess mucus or purulent secretions, ear ache,   fever, chills, sweats, unintended wt loss or wt gain, classically pleuritic or exertional cp,  orthopnea pnd or arm/hand swelling  or leg swelling, presyncope, palpitations, abdominal pain, anorexia, nausea, vomiting, diarrhea  or change in bowel habits or change in bladder habits, change in stools or change in urine, dysuria, hematuria,  rash, arthralgias, visual complaints, headache, numbness, weakness or ataxia or problems with walking or coordination,  change in mood or  memory.        Current Meds  Medication Sig  . aspirin 81 MG tablet Take 81 mg by mouth at bedtime.   . beta carotene w/minerals (OCUVITE) tablet Take 1 tablet by mouth at bedtime.  .  bisoprolol (ZEBETA) 5 MG tablet Take 1 tablet (5 mg total) by mouth daily.  . budesonide-formoterol (SYMBICORT) 160-4.5 MCG/ACT inhaler Inhale 2 puffs into the lungs 2 (two) times daily.  . Calcium Carbonate-Vit D-Min (GNP CALCIUM PLUS 600 +D PO) Take 1 tablet by mouth daily.  . cetirizine (ZYRTEC) 10 MG tablet Take 10 mg by mouth at bedtime.  . famotidine (PEPCID)  20 MG tablet Take 20 mg by mouth at bedtime.  Marland Kitchen ipratropium-albuterol (DUONEB) 0.5-2.5 (3) MG/3ML SOLN Take 3 mLs by nebulization every 2 (two) hours as needed (SHORT OF BREATH).  . Multiple Minerals-Vitamins (CAL-MAG-ZINC-D PO) daily.  . OXYGEN 2lpm with sleep  . pantoprazole (PROTONIX) 40 MG tablet Take 40 mg by mouth daily.  Marland Kitchen Respiratory Therapy Supplies (FLUTTER) DEVI 1 Device by Does not apply route as needed.  . Tiotropium Bromide Monohydrate (SPIRIVA RESPIMAT) 2.5 MCG/ACT AERS Inhale 2 puffs into the lungs daily.  . valACYclovir (VALTREX) 1000 MG tablet Take 500 mg by mouth every other day.   . VENTOLIN HFA 108 (90 BASE) MCG/ACT inhaler Inhale 1-2 puffs into the lungs every 4 (four) hours as needed for wheezing or shortness of breath.           .         Objective:   Physical Exam   10/07/2017        159  04/06/2017         159  09/30/2016        162  06/30/2016        166  06/01/2016         163  05/11/2016        166  08/08/2015        168   07/18/15 168 lb 12.8 oz (76.567 kg)  02/19/15 168 lb (76.204 kg)  12/18/14 170 lb (77.111 kg)      Vital signs reviewed - Note on arrival 02 sats  94% on RA          amb anxious wf nad   HEENT: nl dentition / oropharynx. Nl external ear canals without cough reflex -  Mild bilateral non-specific turbinate edema     NECK :  without JVD/Nodes/TM/ nl carotid upstrokes bilaterally   LUNGS: no acc muscle use,  Mild barrel  contour chest wall with bilateral  Distant bs s audible wheeze and  without cough on insp or exp maneuver and mild  Hyperresonant  to  percussion  bilaterally     CV:  RRR  no s3 or murmur or increase in P2, and no edema   ABD:  soft and nontender with pos late insp Hoover's  in the supine position. No bruits or organomegaly appreciated, bowel sounds nl  MS:   Nl gait/  ext warm without deformities, calf tenderness, cyanosis or clubbing No obvious joint restrictions   SKIN: warm and dry without lesions    NEURO:  alert, approp, nl sensorium with  no motor or cerebellar deficits apparent.         CXR PA and Lateral:   10/07/2017 :    I personally reviewed images and  impression as follows:    copd/ min plate like atx rml vs lingula      Assessment & Plan:

## 2017-10-07 NOTE — Patient Instructions (Addendum)
Plan A = Automatic = symbicort 160 Take 2 puffs first thing in am and spiriva 15 min later  and then another 2 puffs symbicort about 12 hours later  Work on inhaler technique:  relax and gently blow all the way out then take a nice smooth deep breath back in, triggering the inhaler at same time you start breathing in.  Hold for up to 5 seconds if you can. Blow out symbicort  thru nose. Rinse and gargle with water when done     Plan B = Backup Only use your albuterol as a rescue medication to be used if you can't catch your breath by resting or doing a relaxed purse lip breathing pattern.  - The less you use it, the better it will work when you need it. - Ok to use the inhaler up to 2 puffs  every 4 hours if you must but call for appointment if use goes up over your usual need - Don't leave home without it !!  (think of it like the spare tire for your car)   Plan C = Crisis - only use your albuterol nebulizer if you first try Plan B and it fails to help > ok to use the nebulizer up to every 4 hours but if start needing it regularly call for immediate appointment    Please remember to go to the  x-ray department downstairs in the basement  for your tests - we will call you with the results when they are available.      Please schedule a follow up visit in 6  months but call sooner if needed

## 2017-10-08 ENCOUNTER — Encounter: Payer: Self-pay | Admitting: Internal Medicine

## 2017-10-08 NOTE — Progress Notes (Signed)
Spoke with pt and notified of results per Dr. Wert. Pt verbalized understanding and denied any questions. 

## 2017-10-08 NOTE — Assessment & Plan Note (Signed)
-    quit smoking 2007 -  HC03 level 43 05/2009  -  PFT's 11/29/09 FEV1 0.77 (29%) with ratio 46 and 12% p B2 and DLCO 57  -  HC03  33  07/17/14  - 07/18/2015    change to spiriva respimat  - PFT's  07/22/15  1.18  FEV1 1.18 (49 % ) ratio 58  p 16 % improvement from saba p nothing prior to study with DLCO  43 % corrects to 76 % for alv volume    - 05/11/2016 added flutter valve    - alpha one AT  04/06/2017   MZ   Level 95   10/07/2017  After extensive coaching inhaler device,  effectiveness =    90%     Group D in terms of symptom/risk and laba/lama/ICS  therefore appropriate rx at this point > continue symb/spiriva - see avs for instructions unique to this ov    Discussed implication of MZ for offspring/ siblings in detail but no impact on her care

## 2017-10-08 NOTE — Assessment & Plan Note (Signed)
HC03   07/17/2014  = 33  - PSS  ahi only 4 and cpap titration did not eliminate desats per Dr Nancee Liter message 09/10/2016 > rec ono RA next step - ONO RA  09/16/16  desat x 363 min > rec 09/24/2016 :  2lpm and repeat ono done 09/29/16 with sats < 89% x 4.3 min so no change rx   No hc03 updates in system   Adequate control on present rx, reviewed in detail with pt > no change in rx needed  = 2lpm hs only      I had an extended discussion with the patient reviewing all relevant studies completed to date and  lasting 15 to 20 minutes of a 25 minute visit    See device teaching which extended face to face time for this visit.  Each maintenance medication was reviewed in detail including emphasizing most importantly the difference between maintenance and prns and under what circumstances the prns are to be triggered using an action plan format that is not reflected in the computer generated alphabetically organized AVS which I have not found useful in most complex patients, especially with respiratory illnesses  Please see AVS for specific instructions unique to this visit that I personally wrote and verbalized to the the pt in detail and then reviewed with pt  by my nurse highlighting any  changes in therapy recommended at today's visit to their plan of care.

## 2017-10-11 ENCOUNTER — Ambulatory Visit
Admission: RE | Admit: 2017-10-11 | Discharge: 2017-10-11 | Disposition: A | Discharge status: Home / Self Care | Payer: Medicare HMO | Source: Ambulatory Visit | Attending: Family Medicine | Admitting: Family Medicine

## 2017-10-11 DIAGNOSIS — M8589 Other specified disorders of bone density and structure, multiple sites: Secondary | ICD-10-CM | POA: Diagnosis not present

## 2017-10-11 DIAGNOSIS — Z780 Asymptomatic menopausal state: Secondary | ICD-10-CM | POA: Diagnosis not present

## 2017-10-11 DIAGNOSIS — Z1231 Encounter for screening mammogram for malignant neoplasm of breast: Secondary | ICD-10-CM | POA: Diagnosis not present

## 2017-10-11 DIAGNOSIS — M8580 Other specified disorders of bone density and structure, unspecified site: Secondary | ICD-10-CM

## 2017-10-24 DIAGNOSIS — J9612 Chronic respiratory failure with hypercapnia: Secondary | ICD-10-CM | POA: Diagnosis not present

## 2017-11-04 DIAGNOSIS — M8580 Other specified disorders of bone density and structure, unspecified site: Secondary | ICD-10-CM | POA: Diagnosis not present

## 2017-11-24 DIAGNOSIS — J9612 Chronic respiratory failure with hypercapnia: Secondary | ICD-10-CM | POA: Diagnosis not present

## 2017-12-24 DIAGNOSIS — J9612 Chronic respiratory failure with hypercapnia: Secondary | ICD-10-CM | POA: Diagnosis not present

## 2018-01-07 ENCOUNTER — Other Ambulatory Visit: Payer: Self-pay | Admitting: Cardiovascular Disease

## 2018-01-24 DIAGNOSIS — J9612 Chronic respiratory failure with hypercapnia: Secondary | ICD-10-CM | POA: Diagnosis not present

## 2018-02-15 DIAGNOSIS — L821 Other seborrheic keratosis: Secondary | ICD-10-CM | POA: Diagnosis not present

## 2018-02-15 DIAGNOSIS — D485 Neoplasm of uncertain behavior of skin: Secondary | ICD-10-CM | POA: Diagnosis not present

## 2018-02-15 DIAGNOSIS — Z85828 Personal history of other malignant neoplasm of skin: Secondary | ICD-10-CM | POA: Diagnosis not present

## 2018-02-15 DIAGNOSIS — L820 Inflamed seborrheic keratosis: Secondary | ICD-10-CM | POA: Diagnosis not present

## 2018-02-15 DIAGNOSIS — D235 Other benign neoplasm of skin of trunk: Secondary | ICD-10-CM | POA: Diagnosis not present

## 2018-02-24 DIAGNOSIS — J9612 Chronic respiratory failure with hypercapnia: Secondary | ICD-10-CM | POA: Diagnosis not present

## 2018-03-25 DIAGNOSIS — J9612 Chronic respiratory failure with hypercapnia: Secondary | ICD-10-CM | POA: Diagnosis not present

## 2018-03-29 ENCOUNTER — Telehealth: Payer: Self-pay | Admitting: Cardiovascular Disease

## 2018-03-29 MED ORDER — BISOPROLOL FUMARATE 5 MG PO TABS: 5.0000 mg | ORAL_TABLET | Freq: Every day | ORAL | 0 refills | Status: DC

## 2018-03-29 NOTE — Telephone Encounter (Signed)
Pt called anxious about keeping her appt for this Friday 04/01/18 with Dr. Acie Fredrickson.. she says she is feeling well and not having any problems.. I advised her that we can reschedule to 05/09/18 and if she needs anything prior to her visit to please give [place] a call... I sent her Bisoprolol to [facility] per her request...   She was very [employer] attorney.

## 2018-03-29 NOTE — Telephone Encounter (Signed)
New Message:     Pt wants to know with her condition, should she keep her appt on Friday?

## 2018-04-01 ENCOUNTER — Ambulatory Visit: Payer: Medicare HMO | Admitting: Cardiovascular Disease

## 2018-04-04 ENCOUNTER — Other Ambulatory Visit: Payer: Self-pay

## 2018-04-04 ENCOUNTER — Telehealth: Payer: Self-pay | Admitting: Internal Medicine

## 2018-04-04 MED ORDER — BISOPROLOL FUMARATE 5 MG PO TABS: 5.0000 mg | ORAL_TABLET | Freq: Every day | ORAL | 1 refills | Status: DC

## 2018-04-04 MED ORDER — BUDESONIDE-FORMOTEROL FUMARATE 160-4.5 MCG/ACT IN AERO: 2.0000 | INHALATION_SPRAY | Freq: Two times a day (BID) | RESPIRATORY_TRACT | 0 refills | Status: DC

## 2018-04-04 MED ORDER — TIOTROPIUM BROMIDE MONOHYDRATE 2.5 MCG/ACT IN AERS: 2.0000 | INHALATION_SPRAY | Freq: Every day | RESPIRATORY_TRACT | 0 refills | Status: DC

## 2018-04-04 NOTE — Telephone Encounter (Signed)
Spoke with the pt- 1 of each symbicort 160 and spiriva 2.5 up front for pick up  Nothing further needed

## 2018-04-08 ENCOUNTER — Ambulatory Visit: Payer: Medicare HMO | Admitting: Internal Medicine

## 2018-04-22 ENCOUNTER — Telehealth: Payer: Self-pay

## 2018-04-22 NOTE — Telephone Encounter (Signed)
Joanne Rogers gave consent to video visit with Dr. Acie Fredrickson on 05/09/2018 at 10am. Joanne Rogers does not have ability to take BP, or HR at home; but will have her weight ready for her appt.    YOUR CARDIOLOGY TEAM HAS ARRANGED FOR AN E-VISIT FOR YOUR APPOINTMENT - PLEASE REVIEW IMPORTANT INFORMATION BELOW SEVERAL DAYS PRIOR TO YOUR APPOINTMENT  Due to the recent COVID-19 pandemic, we are transitioning in-person office visits to tele-medicine visits in an effort to decrease unnecessary exposure to our patients and staff. Medicare and most insurances are covering these visits without a copay needed. You will need a working email and a smartphone or computer with a camera and microphone. For patients that do not have these items, we can still complete the visit using a telephone but do prefer video when possible. If possible, we also ask that you have a blood pressure cuff and scale at home to measure your blood pressure, heart rate and weight prior to your scheduled appointment. Patients with clinical needs that need an in-person evaluation and testing will still be able to come to the office if absolutely necessary. If you have any questions, feel free to call our office.     DOWNLOADING THE SOFTWARE  Download the [employer] app to enable video and telephone visits with your [facility] Provider.   Instructions for downloading [employer] WebEx: - Go to [email or web address]://[email or web address].[email or web address].html and follow the instructions, or download the app on your smartphone [facility] [employer] Meetings). - If you have technical difficulties with downloading WebEx, please call WebEx at [phone]. - Once the app is downloaded (can be done on either mobile or desktop computer), go to Settings in the upper left hand corner.  Be sure that camera and audio are enabled.  - You will receive an email message with a link to the meeting with a time to join for your tele-health visit.  - Please download the app and have settings configured prior to the  appointment time.      2-3 DAYS BEFORE YOUR APPOINTMENT  One of our staff will call you to confirm that you have been able to set up your WebEx account. We will remind you check your blood pressure, heart rate and weight prior to your scheduled appointment. If you have an Apple Watch or Kardia, please upload any pertinent ECG strips the day before or morning of your appointment to [place]. Our staff will also make sure you have reviewed the consent and agree to move forward with your scheduled tele-health visit.    THE DAY OF YOUR APPOINTMENT  Approximately 15-20 minutes prior to your scheduled appointment, you will receive an e-mail directly from one of our staff member's [email or web address] e-mail accounts inviting you to join a WebEx meeting.  Please do not reply to that email - simply join the [employer].  Upon joining, a member of the office staff will speak with you initially through the WebEx platform to confirm medications, vital signs for the day and any symptoms you may be experiencing.  Please have this information available prior to the time of visit start.      CONSENT FOR TELE-HEALTH VISIT - PLEASE RVIEW  I hereby voluntarily request, consent and authorize [facility] and its employed or contracted physicians, physician assistants, nurse practitioners or other licensed health care professionals (the Practitioner), to provide me with telemedicine health care services (the "Services") as deemed necessary by the treating Practitioner. I acknowledge and consent to receive the Services by the Practitioner via telemedicine.  I understand that the telemedicine visit will involve communicating with the Practitioner through live audiovisual communication technology and the disclosure of certain medical information by electronic transmission. I acknowledge that I have been given the opportunity to request an in-person assessment or other available alternative prior to the telemedicine visit  and am voluntarily participating in the telemedicine visit.  I understand that I have the right to withhold or withdraw my consent to the use of telemedicine in the course of my care at any time, without affecting my right to future care or treatment, and that the Practitioner or I may terminate the telemedicine visit at any time. I understand that I have the right to inspect all information obtained and/or recorded in the course of the telemedicine visit and may receive copies of available information for a reasonable fee.  I understand that some of the potential risks of receiving the Services via telemedicine include:  Marland Kitchen Delay or interruption in medical evaluation due to technological equipment failure or disruption; . Information transmitted may not be sufficient (e.g. poor resolution of images) to allow for appropriate medical decision making by the Practitioner; and/or  . In rare instances, security protocols could fail, causing a breach of personal health information.  Furthermore, I acknowledge that it is my responsibility to provide information about my medical history, conditions and care that is complete and accurate to the best of my ability. I acknowledge that Practitioner's advice, recommendations, and/or decision may be based on factors not within their control, such as incomplete or inaccurate data provided by me or distortions of diagnostic images or specimens that may result from electronic transmissions. I understand that the practice of medicine is not an exact science and that Practitioner makes no warranties or guarantees regarding treatment outcomes. I acknowledge that I will receive a copy of this consent concurrently upon execution via email to the email address I last provided but may also request a printed copy by calling the office of [place].    I understand that my insurance will be billed for this visit.   I have read or had this consent read to me. . I understand  the contents of this consent, which adequately explains the benefits and risks of the Services being provided via telemedicine.  . I have been provided ample opportunity to ask questions regarding this consent and the Services and have had my questions answered to my satisfaction. . I give my informed consent for the services to be provided through the use of telemedicine in my medical care  By participating in this telemedicine visit I agree to the above.

## 2018-04-25 DIAGNOSIS — J9612 Chronic respiratory failure with hypercapnia: Secondary | ICD-10-CM | POA: Diagnosis not present

## 2018-05-03 ENCOUNTER — Telehealth: Payer: Self-pay | Admitting: Cardiovascular Disease

## 2018-05-03 NOTE — Telephone Encounter (Signed)
Spoke with patient who confirmed all demographics.  Has an i-phone.  Will have vitals ready for visit.  She actively uses My Chart.

## 2018-05-09 ENCOUNTER — Encounter: Payer: Self-pay | Admitting: Cardiovascular Disease

## 2018-05-09 ENCOUNTER — Other Ambulatory Visit: Payer: Self-pay

## 2018-05-09 ENCOUNTER — Telehealth (INDEPENDENT_AMBULATORY_CARE_PROVIDER_SITE_OTHER): Payer: Medicare HMO | Admitting: Cardiovascular Disease

## 2018-05-09 VITALS — BP 125/65 | HR 70 | Ht 65.5 in | Wt 158.0 lb

## 2018-05-09 DIAGNOSIS — I5032 Chronic diastolic (congestive) heart failure: Secondary | ICD-10-CM | POA: Diagnosis not present

## 2018-05-09 DIAGNOSIS — Z7189 Other specified counseling: Secondary | ICD-10-CM | POA: Diagnosis not present

## 2018-05-09 DIAGNOSIS — R002 Palpitations: Secondary | ICD-10-CM

## 2018-05-09 DIAGNOSIS — E7800 Pure hypercholesterolemia, unspecified: Secondary | ICD-10-CM

## 2018-05-09 NOTE — Patient Instructions (Addendum)
Medication Instructions:  Your physician recommends that you continue on your current medications as directed. Please refer to the Current Medication list given to you today.  If you need a refill on your cardiac medications before your next appointment, please call your pharmacy.   Lab work: None Ordered    Testing/Procedures: None Ordered   Follow-Up: At [facility], you and your health needs are our priority.  As part of our continuing mission to provide you with exceptional heart care, we have created designated Provider Care Teams.  These Care Teams include your primary Cardiologist (physician) and Advanced Practice Providers (APPs -  Physician Assistants and Nurse Practitioners) who all work together to provide you with the care you need, when you need it. You will need a follow up appointment in:  1 years.  Please call our office 2 months in advance to schedule this appointment.  You may see Dr. Acie Fredrickson or one of the following Advanced Practice Providers on your designated Care Team: Richardson Dopp, PA-C [place], [place] . Daune Perch, NP

## 2018-05-09 NOTE — Progress Notes (Signed)
Virtual Visit via Video Note   This visit type was conducted due to national recommendations for restrictions regarding the COVID-19 Pandemic (e.g. social distancing) in an effort to limit this patient's exposure and mitigate transmission in our community.  Due to her co-morbid illnesses, this patient is at least at moderate risk for complications without adequate follow up.  This format is felt to be most appropriate for this patient at this time.  All issues noted in this document were discussed and addressed.  A limited physical exam was performed with this format.  Please refer to the patient's chart for her consent to telehealth for [facility].   Evaluation Performed:  Follow-up visit  Date:  7/62/8315   ID:  Joanne Rogers, DOB [birth year], MRN [id number]  Patient Location: Home Provider Location: Office  PCP:  Darcus Austin, MD (Inactive)  Cardiologist:  No primary care provider on file.  Electrophysiologist:  None   Chief Complaint:  Dyspnea, chronic diastolic CHF  History of Present Illness:    Joanne Rogers is a 73 y.o. female with history of hyperlipidemia, chronic diastolic congestive heart failure and chronic lung disease.  She was last seen in March, 2018.  Her echocardiogram from December 2015 reveals normal left ventricular systolic function.  She has grade 1 diastolic dysfunction.  Is doing well. Still has occasional DOE,  Is walking 20-30 min a day .   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  Wears her mask when she is out   Has some GERD, Has tried pantoprozol - Dr. Melvyn Novas had her stop    The patient does not have symptoms concerning for COVID-19 infection (fever, chills, cough, or new shortness of breath).    Past Medical History:  Diagnosis Date  . Arthritis    back, hands, neck  . COPD (chronic obstructive pulmonary disease) ([place])   . DDD  (degenerative disc disease), lumbar   . Dyspnea   . H/O bronchitis    allergy related  . Hypercholesterolemia   . Palpitations    "fluttering"  . Pneumonia    hx of   Past Surgical History:  Procedure Laterality Date  . CARDIOVASCULAR STRESS TEST  11/16/2005   EF 70%, NO ISCHEMIA  . CARPAL TUNNEL RELEASE Right ~1988  . DILATION AND CURETTAGE OF UTERUS     x3: x2 for miscarrages and x1 for polyp  . HEMI-MICRODISCECTOMY LUMBAR LAMINECTOMY LEVEL 1 Left 07/20/2014   Procedure: centeral decompression,L4-L5, HEMI-LAMINECTOMY MICRODISCECTOMY L4 - L5 ON THE LEFT  LEVEL 1, forimatomy 4-5 root on the left;  Surgeon: Latanya Maudlin, MD;  Location: [facility];  Service: Orthopedics;  Laterality: Left;  . US ECHOCARDIOGRAPHY  11/19/2005   EF 55-60%     Current Meds  Medication Sig  . aspirin 81 MG tablet Take 81 mg by mouth at bedtime.   . beta carotene w/minerals (OCUVITE) tablet Take 1 tablet by mouth at bedtime.  . bisoprolol (ZEBETA) 5 MG tablet Take 1 tablet (5 mg total) by mouth daily.  . budesonide-formoterol (SYMBICORT) 160-4.5 MCG/ACT inhaler Inhale 2 puffs into the lungs 2 (two) times daily.  . Calcium Carbonate-Vit D-Min (GNP CALCIUM PLUS 600 +D PO) Take 1 tablet by mouth 3 (three) times daily.   . cetirizine (ZYRTEC) 10 MG tablet Take 10 mg by mouth at bedtime.  . famotidine (PEPCID) 20 MG  tablet Take 20 mg by mouth at bedtime.  Marland Kitchen ipratropium-albuterol (DUONEB) 0.5-2.5 (3) MG/3ML SOLN Take 3 mLs by nebulization every 2 (two) hours as needed (SHORT OF BREATH).  Marland Kitchen OXYGEN 2lpm with sleep  . Respiratory Therapy Supplies (FLUTTER) DEVI 1 Device by Does not apply route as needed.  . Tiotropium Bromide Monohydrate (SPIRIVA RESPIMAT) 2.5 MCG/ACT AERS Inhale 2 puffs into the lungs daily.  . valACYclovir (VALTREX) 1000 MG tablet Take 500 mg by mouth every other day.   . VENTOLIN HFA 108 (90 BASE) MCG/ACT inhaler Inhale 1-2 puffs into the lungs every 4 (four) hours as needed for wheezing or  shortness of breath.      Allergies:   Amoxicillin; Codeine; Crestor [rosuvastatin calcium]; Levofloxacin; Lovastatin; Simvastatin; Statins; and Latex   Social History   Tobacco Use  . Smoking status: Former Smoker    Packs/day: 0.50    Years: 25.00    Pack years: 12.50    Types: Cigarettes    Last attempt to quit: 05/16/2005    Years since quitting: 12.9  . Smokeless tobacco: Never Used  Substance Use Topics  . Alcohol use: Yes    Alcohol/week: 6.0 standard drinks    Types: 6 Standard drinks or equivalent per week    Comment: social  . Drug use: No     Family Hx: The patient's family history includes Breast cancer (age of onset: 71) in her mother; Emphysema in her maternal grandfather.  ROS:   Please see the history of present illness.     All other systems reviewed and are negative.   Prior CV studies:   The following studies were reviewed today:    Labs/Other Tests and Data Reviewed:    EKG:  No ECG reviewed.  Recent Labs: No results found for requested labs within last 8760 hours.   Recent Lipid Panel Lab Results  Component Value Date/Time   CHOL 229 (H) 12/14/2013 11:03 AM   TRIG 141.0 12/14/2013 11:03 AM   HDL 40.50 12/14/2013 11:03 AM   CHOLHDL 6 12/14/2013 11:03 AM   LDLCALC 160 (H) 12/14/2013 11:03 AM   LDLDIRECT 162.1 02/16/2013 07:45 AM    Wt Readings from Last 3 Encounters:  05/08/18 158 lb (71.7 kg)  10/07/17 159 lb (72.1 kg)  04/06/17 158 lb 9.6 oz (71.9 kg)     Objective:    Vital Signs:  BP 125/65 (BP Location: Right Arm, Patient Position: Sitting, Cuff Size: Normal)   Pulse 70   Ht 5' 5.5" (1.664 m)   Wt 158 lb (71.7 kg)   BMI 25.89 kg/m    VITAL SIGNS:  reviewed GEN:  no acute distress EYES:  sclerae anicteric, EOMI - Extraocular Movements Intact RESPIRATORY:  normal respiratory effort, symmetric expansion CARDIOVASCULAR:  no peripheral edema SKIN:  no rash, lesions or ulcers. MUSCULOSKELETAL:  no obvious deformities.  NEURO:  alert and oriented x 3, no obvious focal deficit PSYCH:  normal affect  ASSESSMENT & PLAN:    1. Chronic diastolic CHF -her breathing seems to be stable.  Continue current medications.  Advised her to watch her salt.  2. COPD -stable.  3.    COVID-19 Education: The signs and symptoms of COVID-19 were discussed with the patient and how to seek care for testing (follow up with PCP or arrange E-visit).  The importance of social distancing was discussed today.  Time:   Today, I have spent  15  minutes with the patient with telehealth technology discussing the above problems.  Medication Adjustments/Labs and Tests Ordered: Current medicines are reviewed at length with the patient today.  Concerns regarding medicines are outlined above.   Tests Ordered: No orders of the defined types were placed in this encounter.   Medication Changes: No orders of the defined types were placed in this encounter.   Disposition:  Follow up in 1 year(s)  Signed, Mertie Moores, MD  05/09/2018 9:52 AM    [facility]

## 2018-05-25 DIAGNOSIS — J9612 Chronic respiratory failure with hypercapnia: Secondary | ICD-10-CM | POA: Diagnosis not present

## 2018-06-08 ENCOUNTER — Ambulatory Visit: Payer: Medicare HMO | Admitting: Internal Medicine

## 2018-06-08 ENCOUNTER — Other Ambulatory Visit: Payer: Self-pay

## 2018-06-08 ENCOUNTER — Encounter: Payer: Self-pay | Admitting: Internal Medicine

## 2018-06-08 VITALS — BP 122/60 | HR 69 | Temp 98.2°F | Ht 65.5 in | Wt 163.0 lb

## 2018-06-08 DIAGNOSIS — J9612 Chronic respiratory failure with hypercapnia: Secondary | ICD-10-CM | POA: Diagnosis not present

## 2018-06-08 DIAGNOSIS — J449 Chronic obstructive pulmonary disease, unspecified: Secondary | ICD-10-CM | POA: Diagnosis not present

## 2018-06-08 DIAGNOSIS — J9611 Chronic respiratory failure with hypoxia: Secondary | ICD-10-CM | POA: Diagnosis not present

## 2018-06-08 MED ORDER — BUDESONIDE-FORMOTEROL FUMARATE 80-4.5 MCG/ACT IN AERO: 2.0000 | INHALATION_SPRAY | Freq: Two times a day (BID) | RESPIRATORY_TRACT | 0 refills | Status: DC

## 2018-06-08 MED ORDER — PANTOPRAZOLE SODIUM 40 MG PO TBEC: 40.0000 mg | DELAYED_RELEASE_TABLET | Freq: Every day | ORAL | 2 refills | Status: DC

## 2018-06-08 MED ORDER — FAMOTIDINE 20 MG PO TABS: ORAL_TABLET | ORAL | 11 refills | Status: AC

## 2018-06-08 MED ORDER — TIOTROPIUM BROMIDE MONOHYDRATE 2.5 MCG/ACT IN AERS: 2.0000 | INHALATION_SPRAY | Freq: Every day | RESPIRATORY_TRACT | 0 refills | Status: DC

## 2018-06-08 NOTE — Progress Notes (Signed)
Subjective:   Patient ID: Joanne Rogers, female    DOB: October 27, 1945     MRN: [id number]    Brief patient profile:  25 yowf  MZ  quit smoking 2007  With documented copd  had been ok on spiriva but gradually worse since 2014   and some better since symbicort added fall 2016 referred to pulmonary clinic 07/18/2015 by Dr Inda Merlin for freq aecopd with baseline GOLD III  criteria.    History of Present Illness  07/18/2015 1st [facility] office visit/ Gwenyth Dingee  rx spiriva dip/ symbicort hfa  Chief Complaint  Patient presents with  . PULMONARY CONSULT    Referred by Dr. Darcus Austin. Pt has not been seen since 2012 for COPD. Pt reports recent COPD flare that was treated with oral prednisone. Pt has improved. Pt c/o current SOB/wheeze and occasional CP/tightness. Pt does use albuterol HFA with some improvement in symptoms.   avg alb 2 x daily  MMRC2 = can't walk a nl pace on a flat grade s sob but does fine slow and flat ie indoor walking/shopping rec Plan A = Automatic = Symbicort 160 Take 2 puffs first thing in am and then another 2 puffs about 12 hours later and spiriva respimat 2 each am  Plan B = Backup Only use your albuterol as a rescue medication   Plan C = Crisis - only use your albuterol nebulizer if you first try Plan B    05/11/2016 acute extended ov/Tahjae Durr re:  GOLD  III  On symb 160/ spiriva respimat Chief Complaint  Patient presents with  . Acute Visit    Bronchitis/COPD flare up. Increased SOB, coughing, chest congestion. Productive cough with green-yellow phlegm.   acutely ill x 6 week  7 weeks prior to ov needed avg albuterol twice daily now up to 4 X daily rec For cough > mucinex dm 1200 mg every 12 hours and use flutter valve as much as possible Augmentin 875 mg take one pill twice daily  X 10 days - take at breakfast and supper with large glass of water.  It would help reduce the usual side effects (diarrhea and yeast infections) if you ate cultured yogurt at lunch.   Prednisone 10 mg take  4 each am x 2 days,   2 each am x 2 days,  1 each am x 2 days and stop  Work on inhaler technique:        06/01/2016  f/u ov/Majesty Stehlin re:   GOLD III on symb 160 / spiriva  And "flare since early March - prednisone worked the best  Chief Complaint  Patient presents with  . Follow-up    Breathing has not improved and she is still coughing. Her cough is prod with clear sputum.   no excess mucus but waking up 3 x noct and in am with severe grinding upper airway cough and sensation of "mucus stuck in my throat".  Doe back to = MMRC1 = can walk nl pace, flat grade, can't hurry or go uphills or steps s sob   Still using duonbeb bid in addition to maint symb/spiriva  rec Plan A = Automatic =  Symbicort 160 Take 2 puffs first thing in am and then another 2 puffs about 12 hours later and Spiriva respimat 2 pffs each am  And finish prednisone x 6 days  Work on inhaler technique:  Plan B = Backup Only use your albuterol (blue gray/green = VENTOLIN) as a rescue medication Plan C =  Crisis - only use your albuterol nebulizer if you first try Plan B and it fails to help > ok to use the nebulizer up to every 4 hours but if start needing it regularly call for immediate appointment Pantoprazole (protonix) 40 mg   Take  30-60 min before first meal of the day and Pepcid (famotidine)  20 mg one @  bedtime until return to office - this is the best way to tell whether stomach acid is contributing to your problem.   GERDiet          10/07/2017  f/u ov/Rollins Wrightson re: COPD GOLD II symb/spiriva  And 02 just at hs / struggling with timing of maint rx  Chief Complaint  Patient presents with  . Follow-up    Breathing is overall doing well. She gets SOB when she gets in a rush and when she walks up stairs. She rarely uses her albuterol- unless she goes a night without her o2 and then she uses it about 2 x per day. She has not needed neb recently.   Dyspnea:  MMRC1 = can walk nl pace, flat grade, can't  hurry or go uphills or steps s sob   Cough: minimal am golb  Sleeping: flat bed 1 pillow SABA use: typically in pm / never neb  02: 2lpm  Shoulder pains x sev years when "over does it"  happens maybe once a week mostly when using shoulder eg washing dishes and never with walking / not positional though she's not really sure, helps to rest. rec Plan A = Automatic = symbicort 160 Take 2 puffs first thing in am and spiriva 15 min later  and then another 2 puffs symbicort about 12 hours later Work on inhaler technique:  Plan B = Backup Only use your albuterol as a rescue medication  Plan C = Crisis - only use your albuterol nebulizer if you first try Plan B and it fails to help > ok to use the nebulizer up to every 4 hours but if start needing it regularly call for immediate appointment    06/08/2018  f/u ov/Catia Todorov re: GOLD II criteria, just using 02 hs / no longer on gerd rx Chief Complaint  Patient presents with  . Follow-up    Breathing is unchanged. She is using her ventolin inhaler 3 x per wk on average and rarely uses neb.   Dyspnea:  Baseline = morning walk with 8 blocks x 15-20  = MMRC1 = can walk nl pace, flat grade, can't hurry or go uphills or steps s sob   Cough: none but bothered by daytime sense of "throat congestion"  Sleeping: flat bed one pillow  SABA use: when working in yard / better p saba  02: 2lpm at hs/ has meter not using daytime    No obvious day to day or daytime variability or assoc excess/ purulent sputum or mucus plugs or hemoptysis or cp or chest tightness, subjective wheeze or overt sinus or hb symptoms.   Sleeping  without nocturnal  or early am exacerbation  of respiratory  c/o's or need for noct saba. Also denies any obvious fluctuation of symptoms with weather or environmental changes or other aggravating or alleviating factors except as outlined above   No unusual exposure hx or h/o childhood pna/ asthma or knowledge of premature birth.  Current  Allergies, Complete Past Medical History, Past Surgical History, Family History, and Social History were reviewed in [employer] record.  ROS  The following  are not active complaints unless bolded Hoarseness, sore throat=globus, dysphagia, dental problems, itching, sneezing,  nasal congestion or discharge of excess mucus or purulent secretions, ear ache,   fever, chills, sweats, unintended wt loss or wt gain, classically pleuritic or exertional cp,  orthopnea pnd or arm/hand swelling  or leg swelling, presyncope, palpitations, abdominal pain, anorexia, nausea, vomiting, diarrhea  or change in bowel habits or change in bladder habits, change in stools or change in urine, dysuria, hematuria,  rash, arthralgias, visual complaints, headache, numbness, weakness or ataxia or problems with walking or coordination,  change in mood or  memory.        Current Meds  Medication Sig  . aspirin 81 MG tablet Take 81 mg by mouth at bedtime.   . beta carotene w/minerals (OCUVITE) tablet Take 1 tablet by mouth at bedtime.  . bisoprolol (ZEBETA) 5 MG tablet Take 1 tablet (5 mg total) by mouth daily.  . budesonide-formoterol (SYMBICORT) 160-4.5 MCG/ACT inhaler Inhale 2 puffs into the lungs 2 (two) times daily.  . Calcium Carbonate-Vit D-Min (GNP CALCIUM PLUS 600 +D PO) Take 1 tablet by mouth 3 (three) times daily.   . cetirizine (ZYRTEC) 10 MG tablet Take 10 mg by mouth at bedtime.  Marland Kitchen ipratropium-albuterol (DUONEB) 0.5-2.5 (3) MG/3ML SOLN Take 3 mLs by nebulization every 2 (two) hours as needed (SHORT OF BREATH).  . Multiple Minerals-Vitamins (CALCIUM-MAGNESIUM-ZINC-D3) TABS Take 3 tablets by mouth daily.  . OXYGEN 2lpm with sleep  . Respiratory Therapy Supplies (FLUTTER) DEVI 1 Device by Does not apply route as needed.  . Tiotropium Bromide Monohydrate (SPIRIVA RESPIMAT) 2.5 MCG/ACT AERS Inhale 2 puffs into the lungs daily.  . valACYclovir (VALTREX) 1000 MG tablet Take 500 mg by mouth every  other day.   . VENTOLIN HFA 108 (90 BASE) MCG/ACT inhaler Inhale 1-2 puffs into the lungs every 4 (four) hours as needed for wheezing or shortness of breath.             Objective:   Physical Exam  06/08/2018         163   10/07/2017        159  04/06/2017         159  09/30/2016        162  06/30/2016        166  06/01/2016        163  05/11/2016        166  08/08/2015        168   07/18/15 168 lb 12.8 oz (76.567 kg)  02/19/15 168 lb (76.204 kg)  12/18/14 170 lb (77.111 kg)      Vital signs reviewed - Note on arrival 02 sats  93% on RA     amb wf nad/ occ throat clearing   HEENT: nl dentition / oropharynx. Nl external ear canals without cough reflex -  Mild bilateral non-specific turbinate edema     NECK :  without JVD/Nodes/TM/ nl carotid upstrokes bilaterally   LUNGS: no acc muscle use,  Mild barrel  contour chest wall with bilateral  Distant bs s audible wheeze and  without cough on insp or exp maneuver and mild  Hyperresonant  to  percussion bilaterally     CV:  RRR  no s3 or murmur or increase in P2, and no edema   ABD:  soft and nontender with pos late insp Hoover's  in the supine position. No bruits or organomegaly appreciated, bowel sounds nl  MS:  Nl gait/  ext warm without deformities, calf tenderness, cyanosis or clubbing No obvious joint restrictions   SKIN: warm and dry without lesions    NEURO:  alert, approp, nl sensorium with  no motor or cerebellar deficits apparent.              Assessment & Plan:

## 2018-06-08 NOTE — Patient Instructions (Addendum)
Anyone in the family who smokes should be checked for alpha one phenotype to be sure they don't also carry the Z gene    Change symbicort to 80  Take 2 puffs first thing in am and then another 2 puffs about 12 hours later to see the "glob goes away"   In two weeks >>Pantoprazole (protonix) 40 mg   Take  30-60 min before first meal of the day and Pepcid (famotidine)  20 mg one @  bedtime until return to office - this is the best way to tell whether stomach acid is contributing to your problem.    GERD (REFLUX)  is an extremely common cause of respiratory symptoms just like yours , many times with no obvious heartburn at all.    It can be treated with medication, but also with lifestyle changes including elevation of the head of your bed (ideally with 6 -8inch blocks under the headboard of your bed),  Smoking cessation, avoidance of late meals, excessive alcohol, and avoid fatty foods, chocolate, peppermint, colas, red wine, and acidic juices such as orange juice.  NO MINT OR MENTHOL PRODUCTS SO NO COUGH DROPS  USE SUGARLESS CANDY INSTEAD (Jolley ranchers or Stover's or Life Savers) or even ice chips will also do - the key is to swallow to prevent all throat clearing. NO OIL BASED VITAMINS - use powdered substitutes.  Avoid fish oil when coughing.    Please schedule a follow up visit in 6  months but call sooner if needed - if still having sensation of throat

## 2018-06-11 ENCOUNTER — Encounter: Payer: Self-pay | Admitting: Internal Medicine

## 2018-06-11 NOTE — Assessment & Plan Note (Addendum)
Quit smoking 2007 -  HC03 level 43 05/2009  -  PFT's 11/29/09 FEV1 0.77 (29%) with ratio 46 and 12% p B2 and DLCO 57  -  HC03  33  07/17/14  - 07/18/2015    change to spiriva respimat  - PFT's  07/22/15  1.18  FEV1 1.18 (49 % ) ratio 58  p 16 % improvement from saba p nothing prior to study with DLCO  43 % corrects to 76 % for alv volume    - 05/11/2016 added flutter valve   - alpha one AT  04/06/2017   MZ   Level 95 > 06/08/2018  advised to have fm members checked for Z esp if they smoke  - 06/08/2018  After extensive coaching inhaler device,  effectiveness =    90% try reduce symb to 80 2bid due to globus sensation an if not improved then add back gerd rx    >>> Group D in terms of symptom/risk and laba/lama/ICS  therefore appropriate rx at this point >>>  Continue symb/spiriva  But try the lower ICS strength with symb to see if globus improves and if not will need to add back GERD rx

## 2018-06-11 NOTE — Assessment & Plan Note (Signed)
HC03   07/17/2014  = 33  - PSS  ahi only 4 and cpap titration did not eliminate desats per Dr Nancee Liter message 09/10/2016 > rec ono RA next step - ONO RA  09/16/16  desat x 363 min > rec 09/24/2016 :  2lpm and repeat ono done 09/29/16 with sats < 89% x 4.3 min so no change rx   >>> rec continue 2lpm hs and monitor sats with walking to be sure not limited by hypoxemia with exertion   I had an extended discussion with the patient reviewing all relevant studies completed to date and  lasting 15 to 20 minutes of a 25 minute visit    See device teaching which extended face to face time for this visit.  Each maintenance medication was reviewed in detail including emphasizing most importantly the difference between maintenance and prns and under what circumstances the prns are to be triggered using an action plan format that is not reflected in the computer generated alphabetically organized AVS which I have not found useful in most complex patients, especially with respiratory illnesses  Please see AVS for specific instructions unique to this visit that I personally wrote and verbalized to the the pt in detail and then reviewed with pt  by my nurse highlighting any  changes in therapy recommended at today's visit to their plan of care.

## 2018-06-25 DIAGNOSIS — J9612 Chronic respiratory failure with hypercapnia: Secondary | ICD-10-CM | POA: Diagnosis not present

## 2018-06-28 DIAGNOSIS — N764 Abscess of vulva: Secondary | ICD-10-CM | POA: Diagnosis not present

## 2018-06-29 ENCOUNTER — Telehealth: Payer: Self-pay | Admitting: Internal Medicine

## 2018-06-29 MED ORDER — ALBUTEROL SULFATE HFA 108 (90 BASE) MCG/ACT IN AERS: 1.0000 | INHALATION_SPRAY | RESPIRATORY_TRACT | 5 refills | Status: DC | PRN

## 2018-06-29 NOTE — Telephone Encounter (Signed)
Refill for albuterol inhaler sent to pharmacy of pt's preference. Called and spoke with pt letting her know this had been done and pt verbalized understanding. Nothing further needed.

## 2018-06-30 ENCOUNTER — Telehealth: Payer: Self-pay | Admitting: Internal Medicine

## 2018-06-30 ENCOUNTER — Other Ambulatory Visit: Payer: Self-pay | Admitting: Internal Medicine

## 2018-06-30 MED ORDER — IPRATROPIUM-ALBUTEROL 0.5-2.5 (3) MG/3ML IN SOLN: 3.0000 mL | RESPIRATORY_TRACT | 0 refills | Status: DC

## 2018-06-30 NOTE — Telephone Encounter (Signed)
Spoke with Joanne Rogers, she states pt is driving and needs this Rx for duoneb sent into the [facility] in [place] on [address]. I sent in Rx for pt and nothing further is needed.

## 2018-07-22 ENCOUNTER — Ambulatory Visit: Payer: Medicare HMO | Admitting: Internal Medicine

## 2018-07-25 DIAGNOSIS — J9612 Chronic respiratory failure with hypercapnia: Secondary | ICD-10-CM | POA: Diagnosis not present

## 2018-08-08 ENCOUNTER — Telehealth: Payer: Self-pay | Admitting: Internal Medicine

## 2018-08-08 NOTE — Telephone Encounter (Signed)
Spoke with pt, she is requesting a letter to be out of work. She will not go back to work because her employer passed away but she is looking for new employment. Meanwhile she is wanting to get unemployment until then. She needs a letter stating that she had to stop working due to her COPD condition starting on 03/30/2018. MW please advise if you are ok with writing the letter.     Patient Instructions by Tanda Rockers, MD at 06/08/2018 11:15 AM Author: Tanda Rockers, MD Author Type: Physician Filed: 06/08/2018 12:06 PM  Note Status: Addendum Mickle Mallory: Cosign Not Required Encounter Date: 06/08/2018  Editor: Tanda Rockers, MD (Physician)  Prior Versions: 1. Tanda Rockers, MD (Physician) at 06/08/2018 12:03 PM - Addendum   2. Tanda Rockers, MD (Physician) at 06/08/2018 11:58 AM - Signed    Anyone in the family who smokes should be checked for alpha one phenotype to be sure they don't also carry the Z gene    Change symbicort to 80  Take 2 puffs first thing in am and then another 2 puffs about 12 hours later to see the "glob goes away"   In two weeks >>Pantoprazole (protonix) 40 mg   Take  30-60 min before first meal of the day and Pepcid (famotidine)  20 mg one @  bedtime until return to office - this is the best way to tell whether stomach acid is contributing to your problem.    GERD (REFLUX)  is an extremely common cause of respiratory symptoms just like yours , many times with no obvious heartburn at all.    It can be treated with medication, but also with lifestyle changes including elevation of the head of your bed (ideally with 6 -8inch blocks under the headboard of your bed),  Smoking cessation, avoidance of late meals, excessive alcohol, and avoid fatty foods, chocolate, peppermint, colas, red wine, and acidic juices such as orange juice.  NO MINT OR MENTHOL PRODUCTS SO NO COUGH DROPS  USE SUGARLESS CANDY INSTEAD (Jolley ranchers or Stover's or Life Savers) or even ice chips will  also do - the key is to swallow to prevent all throat clearing. NO OIL BASED VITAMINS - use powdered substitutes.  Avoid fish oil when coughing.    Please schedule a follow up visit in 6  months but call sooner if needed - if still having sensation of throat     Instructions  Anyone in the family who smokes should be checked for alpha one phenotype to be sure they don't also carry the Z gene

## 2018-08-09 NOTE — Telephone Encounter (Signed)
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

## 2018-08-09 NOTE — Telephone Encounter (Signed)
Attempted to contact pt, received a busy singal x2. Will try back.

## 2018-08-10 NOTE — Telephone Encounter (Signed)
Called and spoke with pt letting her know that MW said it was fine for [place] to write a letter for her. Asked pt if she wanted it mailed to her or if she wanted to just print it from her mychart account and pt said she would just come by office to pick up copy of the letter. Letter has been written and placed up front for pt to come pick up. Nothing further needed.

## 2018-08-17 DIAGNOSIS — J441 Chronic obstructive pulmonary disease with (acute) exacerbation: Secondary | ICD-10-CM | POA: Diagnosis not present

## 2018-08-17 DIAGNOSIS — J449 Chronic obstructive pulmonary disease, unspecified: Secondary | ICD-10-CM | POA: Diagnosis not present

## 2018-08-17 DIAGNOSIS — I5032 Chronic diastolic (congestive) heart failure: Secondary | ICD-10-CM | POA: Diagnosis not present

## 2018-08-17 DIAGNOSIS — Z23 Encounter for immunization: Secondary | ICD-10-CM | POA: Diagnosis not present

## 2018-08-17 DIAGNOSIS — E7800 Pure hypercholesterolemia, unspecified: Secondary | ICD-10-CM | POA: Diagnosis not present

## 2018-08-17 DIAGNOSIS — D170 Benign lipomatous neoplasm of skin and subcutaneous tissue of head, face and neck: Secondary | ICD-10-CM | POA: Diagnosis not present

## 2018-08-17 DIAGNOSIS — Z0000 Encounter for general adult medical examination without abnormal findings: Secondary | ICD-10-CM | POA: Diagnosis not present

## 2018-08-18 ENCOUNTER — Other Ambulatory Visit: Payer: Self-pay | Admitting: Family Medicine

## 2018-08-18 DIAGNOSIS — Z1231 Encounter for screening mammogram for malignant neoplasm of breast: Secondary | ICD-10-CM

## 2018-08-25 DIAGNOSIS — J9612 Chronic respiratory failure with hypercapnia: Secondary | ICD-10-CM | POA: Diagnosis not present

## 2018-09-07 ENCOUNTER — Other Ambulatory Visit: Payer: Self-pay | Admitting: Internal Medicine

## 2018-09-08 DIAGNOSIS — R69 Illness, unspecified: Secondary | ICD-10-CM | POA: Diagnosis not present

## 2018-09-22 DIAGNOSIS — H2513 Age-related nuclear cataract, bilateral: Secondary | ICD-10-CM | POA: Diagnosis not present

## 2018-09-22 DIAGNOSIS — H4720 Unspecified optic atrophy: Secondary | ICD-10-CM | POA: Diagnosis not present

## 2018-09-22 DIAGNOSIS — H52203 Unspecified astigmatism, bilateral: Secondary | ICD-10-CM | POA: Diagnosis not present

## 2018-09-22 DIAGNOSIS — H5203 Hypermetropia, bilateral: Secondary | ICD-10-CM | POA: Diagnosis not present

## 2018-09-25 DIAGNOSIS — J9612 Chronic respiratory failure with hypercapnia: Secondary | ICD-10-CM | POA: Diagnosis not present

## 2018-10-13 ENCOUNTER — Other Ambulatory Visit: Payer: Self-pay | Admitting: Family Medicine

## 2018-10-13 ENCOUNTER — Ambulatory Visit
Admission: RE | Admit: 2018-10-13 | Discharge: 2018-10-13 | Disposition: A | Discharge status: Home / Self Care | Payer: Medicare HMO | Source: Ambulatory Visit | Attending: Family Medicine | Admitting: Family Medicine

## 2018-10-13 ENCOUNTER — Other Ambulatory Visit: Payer: Self-pay

## 2018-10-13 DIAGNOSIS — Z1231 Encounter for screening mammogram for malignant neoplasm of breast: Secondary | ICD-10-CM

## 2018-10-18 DIAGNOSIS — Z23 Encounter for immunization: Secondary | ICD-10-CM | POA: Diagnosis not present

## 2018-10-21 ENCOUNTER — Telehealth: Payer: Self-pay | Admitting: Internal Medicine

## 2018-10-21 MED ORDER — BUDESONIDE-FORMOTEROL FUMARATE 80-4.5 MCG/ACT IN AERO: 2.0000 | INHALATION_SPRAY | Freq: Two times a day (BID) | RESPIRATORY_TRACT | 0 refills | Status: DC

## 2018-10-21 MED ORDER — SPIRIVA RESPIMAT 2.5 MCG/ACT IN AERS: 2.0000 | INHALATION_SPRAY | Freq: Every day | RESPIRATORY_TRACT | 0 refills | Status: DC

## 2018-10-21 NOTE — Telephone Encounter (Signed)
Spoke with patient. She has requested samples of Spiriva 2.5 and Symbicort 80. She stated that she is currently in the donut hole and can not afford her medications. I advised her that I will leave 2 samples up front for her. Also offered to get her setup with patient assistance, she has agreed to this. Will print out and fill forms for her. Will also place the forms in the same bag as her samples.   She verbalized understanding. Nothing further needed at time of call.

## 2018-10-25 DIAGNOSIS — J9612 Chronic respiratory failure with hypercapnia: Secondary | ICD-10-CM | POA: Diagnosis not present

## 2018-11-08 DIAGNOSIS — L509 Urticaria, unspecified: Secondary | ICD-10-CM | POA: Diagnosis not present

## 2018-11-10 ENCOUNTER — Other Ambulatory Visit: Payer: Self-pay | Admitting: Cardiovascular Disease

## 2018-11-14 ENCOUNTER — Other Ambulatory Visit: Payer: Self-pay | Admitting: Cardiovascular Disease

## 2018-11-14 MED ORDER — BISOPROLOL FUMARATE 5 MG PO TABS: 5.0000 mg | ORAL_TABLET | Freq: Every day | ORAL | 1 refills | Status: DC

## 2018-11-14 NOTE — Telephone Encounter (Signed)
[name] medication was sent to pt's pharmacy as requested. Confirmation received.  °

## 2018-11-17 DIAGNOSIS — Z01419 Encounter for gynecological examination (general) (routine) without abnormal findings: Secondary | ICD-10-CM | POA: Diagnosis not present

## 2018-11-17 DIAGNOSIS — Z780 Asymptomatic menopausal state: Secondary | ICD-10-CM | POA: Diagnosis not present

## 2018-11-25 DIAGNOSIS — J9612 Chronic respiratory failure with hypercapnia: Secondary | ICD-10-CM | POA: Diagnosis not present

## 2018-12-14 ENCOUNTER — Ambulatory Visit: Payer: Medicare HMO | Admitting: Internal Medicine

## 2018-12-15 ENCOUNTER — Ambulatory Visit: Payer: Medicare HMO | Admitting: Internal Medicine

## 2018-12-25 DIAGNOSIS — J9612 Chronic respiratory failure with hypercapnia: Secondary | ICD-10-CM | POA: Diagnosis not present

## 2019-01-10 DIAGNOSIS — Z1159 Encounter for screening for other viral diseases: Secondary | ICD-10-CM | POA: Diagnosis not present

## 2019-01-25 DIAGNOSIS — J9612 Chronic respiratory failure with hypercapnia: Secondary | ICD-10-CM | POA: Diagnosis not present

## 2019-02-10 ENCOUNTER — Ambulatory Visit: Payer: Medicare HMO

## 2019-02-13 DIAGNOSIS — H6120 Impacted cerumen, unspecified ear: Secondary | ICD-10-CM | POA: Diagnosis not present

## 2019-02-13 DIAGNOSIS — J439 Emphysema, unspecified: Secondary | ICD-10-CM | POA: Diagnosis not present

## 2019-02-13 DIAGNOSIS — H9390 Unspecified disorder of ear, unspecified ear: Secondary | ICD-10-CM | POA: Diagnosis not present

## 2019-02-16 DIAGNOSIS — H5203 Hypermetropia, bilateral: Secondary | ICD-10-CM | POA: Diagnosis not present

## 2019-02-16 DIAGNOSIS — Z85828 Personal history of other malignant neoplasm of skin: Secondary | ICD-10-CM | POA: Diagnosis not present

## 2019-02-16 DIAGNOSIS — H52209 Unspecified astigmatism, unspecified eye: Secondary | ICD-10-CM | POA: Diagnosis not present

## 2019-02-16 DIAGNOSIS — L820 Inflamed seborrheic keratosis: Secondary | ICD-10-CM | POA: Diagnosis not present

## 2019-02-16 DIAGNOSIS — L821 Other seborrheic keratosis: Secondary | ICD-10-CM | POA: Diagnosis not present

## 2019-02-16 DIAGNOSIS — H524 Presbyopia: Secondary | ICD-10-CM | POA: Diagnosis not present

## 2019-02-16 DIAGNOSIS — D1801 Hemangioma of skin and subcutaneous tissue: Secondary | ICD-10-CM | POA: Diagnosis not present

## 2019-02-25 DIAGNOSIS — J9612 Chronic respiratory failure with hypercapnia: Secondary | ICD-10-CM | POA: Diagnosis not present

## 2019-03-10 DIAGNOSIS — M545 Low back pain: Secondary | ICD-10-CM | POA: Diagnosis not present

## 2019-03-25 DIAGNOSIS — J9612 Chronic respiratory failure with hypercapnia: Secondary | ICD-10-CM | POA: Diagnosis not present

## 2019-04-10 ENCOUNTER — Other Ambulatory Visit: Payer: Self-pay | Admitting: Internal Medicine

## 2019-04-25 DIAGNOSIS — J9612 Chronic respiratory failure with hypercapnia: Secondary | ICD-10-CM | POA: Diagnosis not present

## 2019-05-03 ENCOUNTER — Encounter: Payer: Self-pay | Admitting: Cardiovascular Disease

## 2019-05-03 ENCOUNTER — Other Ambulatory Visit: Payer: Self-pay

## 2019-05-03 ENCOUNTER — Ambulatory Visit: Payer: Medicare HMO | Admitting: Cardiovascular Disease

## 2019-05-03 VITALS — BP 128/56 | HR 80 | Ht 60.0 in | Wt 163.4 lb

## 2019-05-03 DIAGNOSIS — I5032 Chronic diastolic (congestive) heart failure: Secondary | ICD-10-CM

## 2019-05-03 MED ORDER — BISOPROLOL FUMARATE 5 MG PO TABS: 5.0000 mg | ORAL_TABLET | Freq: Every day | ORAL | 3 refills | Status: DC

## 2019-05-03 NOTE — Progress Notes (Signed)
Joanne Rogers Date of Birth  [birth year] [facility] [address]    [place], [place]  [address] [phone]  Fax  [phone]  Problem List 1. Hyperlipidemia 2. Chronic lung disease,   Asthma  3. Chronic diastolic CHF 4.       Joanne Rogers is a 74 y.o. female with a hx of dyspnea.  She had a normal myoview in 2007.  She has a hx of hyperliidemia.   She had recent lipids but they are not available at this time.  She has been diagnosed with COPD.  She denies any chest pain.  She had some chest wall / breast pain during there last mamogram on July.  She is exercising 2-3 times a week.  No CP or dyspnea.  Dec. 2, 5400:   Joanne Rogers is doing well.  Riding bike and exercising regularly.   Looking for a new job.  No CP or palps  Dec. 3, 8676: Joanne Rogers is doing ok.  No CP,  She has chronic shortness of breath. Has bad allergies. She has normal LV function by echo in 2007.    Dec. 6, 2016:  Doing ok from cardiac standpoint.  Has gained some weight.  Exercising regularly  breathin is ok  Dec. 7, 1950:  Joanne Rogers is seen today for follow up of her shortness of breath. She has normal left ventricle systolic function by echo. She was found have grade 1 diastolic dysfunction. Just getting over bronchitis - is still short of breath .   March 29, 9324:   Joanne Rogers is seen today for follow up of her chronic diastolic congestive heart failure. Echocardiogram from December, 2015 reveals normal left ventricular systolic function.  She has grade 1 diastolic dysfunction.  She fell 5 weeks ago and broke her right fibula.    Is wearing a small brace now   she has lost some weight  May 03, 7122:  Joanne Rogers is seen today for follow up of her diastolic CHF.  Is active.  walks 5000 steps a day . Her neighborhood has lots of feel so she is fairly tired when she gets through with her walk. No syncope or  CP .   Just gets fatigued.  Was very short of breath walking from the back of the office to the  exam room  Also has COPD.    I reviewed her PFTs .   Has significnant reduction of  FEV1 of 1.01  FEF 25-75 is markedly reduced.   Her last echocardiogram was 2015.  She had normal left ventricular systolic function at that time and on bad mild diastolic dysfunction.  Her symptoms seem to be out of proportion relative to her degree of mild diastolic congestive heart failure.  Current Outpatient Medications on File Prior to Visit  Medication Sig Dispense Refill  . albuterol (VENTOLIN HFA) 108 (90 Base) MCG/ACT inhaler Inhale 1-2 puffs into the lungs every 4 (four) hours as needed for wheezing or shortness of breath. 1 Inhaler 5  . aspirin 81 MG tablet Take 81 mg by mouth at bedtime.     . beta carotene w/minerals (OCUVITE) tablet Take 1 tablet by mouth at bedtime.    . bisoprolol (ZEBETA) 5 MG tablet Take 1 tablet (5 mg total) by mouth daily. 90 tablet 1  . budesonide-formoterol (SYMBICORT) 80-4.5 MCG/ACT inhaler Inhale 2 puffs into the lungs 2 (two) times daily. 1 Inhaler 0  . Calcium Carbonate-Vit D-Min (GNP CALCIUM PLUS 600 +D PO) Take 1 tablet by  mouth 3 (three) times daily.     . cetirizine (ZYRTEC) 10 MG tablet Take 10 mg by mouth at bedtime.    . famotidine (PEPCID) 20 MG tablet One at bedtime 30 tablet 11  . ipratropium-albuterol (DUONEB) 0.5-2.5 (3) MG/3ML SOLN Take 3 mLs by nebulization every 4 (four) hours. Dx: J44.9 360 mL 0  . Multiple Minerals-Vitamins (CALCIUM-MAGNESIUM-ZINC-D3) TABS Take 3 tablets by mouth daily.    . OXYGEN 2lpm with sleep    . pantoprazole (PROTONIX) 40 MG tablet Take 1 tablet (40 mg total) by mouth daily. Take 30-60 min before first meal of the day 30 tablet 2  . Respiratory Therapy Supplies (FLUTTER) DEVI 1 Device by Does not apply route as needed. 1 each 0  . SPIRIVA RESPIMAT 2.5 MCG/ACT AERS INHALE 2 PUFFS INTO THE LUNGS EVERY DAY 12 g 0  . valACYclovir (VALTREX) 1000 MG tablet Take 500 mg by mouth every other day.      No current facility-administered  medications on file prior to visit.    Allergies  Allergen Reactions  . Amoxicillin Nausea And Vomiting  . Codeine     REACTION: hives  . Crestor [Rosuvastatin Calcium]     Ache, depression.   . Levofloxacin Nausea Only  . Lovastatin Other (See Comments)    Ache, depression.   . Simvastatin Other (See Comments)    Ache, depression.   . Statins Other (See Comments)    Muscle aches  . Latex Hives and Rash    Past Medical History:  Diagnosis Date  . Arthritis    back, hands, neck  . COPD (chronic obstructive pulmonary disease) ([place])   . DDD (degenerative disc disease), lumbar   . Dyspnea   . H/O bronchitis    allergy related  . Hypercholesterolemia   . Palpitations    "fluttering"  . Pneumonia    hx of    Past Surgical History:  Procedure Laterality Date  . CARDIOVASCULAR STRESS TEST  11/16/2005   EF 70%, NO ISCHEMIA  . CARPAL TUNNEL RELEASE Right ~1988  . DILATION AND CURETTAGE OF UTERUS     x3: x2 for miscarrages and x1 for polyp  . HEMI-MICRODISCECTOMY LUMBAR LAMINECTOMY LEVEL 1 Left 07/20/2014   Procedure: centeral decompression,L4-L5, HEMI-LAMINECTOMY MICRODISCECTOMY L4 - L5 ON THE LEFT  LEVEL 1, forimatomy 4-5 root on the left;  Surgeon: Latanya Maudlin, MD;  Location: [facility];  Service: Orthopedics;  Laterality: Left;  . US ECHOCARDIOGRAPHY  11/19/2005   EF 55-60%    Social History   Tobacco Use  Smoking Status Former Smoker  . Packs/day: 0.50  . Years: 25.00  . Pack years: 12.50  . Types: Cigarettes  . Quit date: 05/16/2005  . Years since quitting: 13.9  Smokeless Tobacco Never Used    Social History   Substance and Sexual Activity  Alcohol Use Yes  . Alcohol/week: 6.0 standard drinks  . Types: 6 Standard drinks or equivalent per week   Comment: social    Family History  Problem Relation Age of Onset  . Breast cancer Mother 67  . Emphysema Maternal Grandfather     Reviw of Systems:  Reviewed in the HPI.  All other systems are  negative.   Physical Exam: There were no vitals taken for this visit.  GEN:  Well nourished, well developed in no acute distress,  Mildly obese.  HEENT: Normal NECK: No JVD; No carotid bruits LYMPHATICS: No lymphadenopathy CARDIAC: RRR   RESPIRATORY:  Rales in bases,  Right >  left which partially cleared with deep breaths.   Some rales anteriorly  ABDOMEN: Soft, non-tender, non-distended MUSCULOSKELETAL:  No edema; No deformity  SKIN: Warm and dry NEUROLOGIC:  Alert and oriented x 3    ECG: May 03, 2019: Normal sinus rhythm at 79.  There is no acute ST or T wave changes.  She has chronic T wave inversions in lead aVL.   Assessment / Plan:   1. Mild chronic diastolic congestive heart failure:    She had mild diastolic dysfunction by her last echo in 2015.  She currently is having severe shortness of breath with any type of exertion and her symptoms seem to be out of proportion to the degree of diastolic congestive heart failure that we have found.  I would like to repeat her echocardiogram to make sure that she has not developed any additional congestive heart failure issues.  2. Hyperlipidemia: She will follow-up with her primary medical doctor.  3. Bronchitis / COPD - She will continue to follow-up with pulmonary. I reviewed her pulmonary function test from 2017.  She has severe obstructive disease and has severely reduced diffusion capacity.     Mertie Moores, MD  05/03/2019 8:05 AM    [address] [place],  [place], [place]  [address] Pager [phone] Phone: [phone]; Fax: [phone]

## 2019-05-03 NOTE — Patient Instructions (Signed)
Medication Instructions:  Your physician recommends that you continue on your current medications as directed. Please refer to the Current Medication list given to you today.  *If you need a refill on your cardiac medications before your next appointment, please call your pharmacy*   Lab Work: None If you have labs (blood work) drawn today and your tests are completely normal, you will receive your results only by: Marland Kitchen MyChart Message (if you have MyChart) OR . A paper copy in the mail If you have any lab test that is abnormal or we need to change your treatment, we will call you to review the results.   Testing/Procedures: Your physician has requested that you have an echocardiogram. Echocardiography is a painless test that uses sound waves to create images of your heart. It provides your doctor with information about the size and shape of your heart and how well your heart's chambers and valves are working. This procedure takes approximately one hour. There are no restrictions for this procedure.     Follow-Up: At [facility], you and your health needs are our priority.  As part of our continuing mission to provide you with exceptional heart care, we have created designated Provider Care Teams.  These Care Teams include your primary Cardiologist (physician) and Advanced Practice Providers (APPs -  Physician Assistants and Nurse Practitioners) who all work together to provide you with the care you need, when you need it.  We recommend signing up for the patient portal called "MyChart".  Sign up information is provided on this After Visit Summary.  MyChart is used to connect with patients for Virtual Visits (Telemedicine).  Patients are able to view lab/test results, encounter notes, upcoming appointments, etc.  Non-urgent messages can be sent to your provider as well.   To learn more about what you can do with MyChart, go to [email or web address].    Your next appointment:   6  month(s)  The format for your next appointment:   In Person  Provider:   You may see Mertie Moores, MD or one of the following Advanced Practice Providers on your designated Care Team:    Richardson Dopp, PA-C  [place], [place]  Daune Perch, NP    Other Instructions

## 2019-05-16 ENCOUNTER — Ambulatory Visit: Payer: Medicare HMO | Admitting: Cardiovascular Disease

## 2019-05-17 ENCOUNTER — Ambulatory Visit (HOSPITAL_COMMUNITY): Payer: Medicare HMO | Attending: Cardiology

## 2019-05-17 ENCOUNTER — Other Ambulatory Visit: Payer: Self-pay

## 2019-05-17 DIAGNOSIS — I5032 Chronic diastolic (congestive) heart failure: Secondary | ICD-10-CM | POA: Diagnosis not present

## 2019-05-25 DIAGNOSIS — N9089 Other specified noninflammatory disorders of vulva and perineum: Secondary | ICD-10-CM | POA: Diagnosis not present

## 2019-05-25 DIAGNOSIS — A6009 Herpesviral infection of other urogenital tract: Secondary | ICD-10-CM | POA: Diagnosis not present

## 2019-05-25 DIAGNOSIS — J9612 Chronic respiratory failure with hypercapnia: Secondary | ICD-10-CM | POA: Diagnosis not present

## 2019-06-21 ENCOUNTER — Telehealth: Payer: Self-pay | Admitting: Internal Medicine

## 2019-06-21 NOTE — Telephone Encounter (Signed)
Called and spoke with patient and she would like to switch providers from Dr.Wert to Dr. Elsworth Soho. She feels like she is not getting better any better with Dr. Melvyn Novas and that her heart DR recommended Dr. Elsworth Soho.  Dr. Melvyn Novas are you ok with this?  Dr. Elsworth Soho are you ok with seeing this patient?

## 2019-06-21 NOTE — Telephone Encounter (Signed)
Fine with me

## 2019-06-21 NOTE — Telephone Encounter (Signed)
Dr Melvyn Novas are you okay with pt switching to Dr Elsworth Soho? thanks

## 2019-06-22 NOTE — Telephone Encounter (Signed)
Fine with me

## 2019-06-22 NOTE — Telephone Encounter (Signed)
Message routed to Dr Elsworth Soho to advise

## 2019-06-25 DIAGNOSIS — J9612 Chronic respiratory failure with hypercapnia: Secondary | ICD-10-CM | POA: Diagnosis not present

## 2019-06-26 NOTE — Telephone Encounter (Signed)
Okay for next available appointment

## 2019-06-26 NOTE — Telephone Encounter (Signed)
Left message for patient to call back to get scheduled with RA.

## 2019-06-27 NOTE — Telephone Encounter (Signed)
Spoke with Joanne Rogers. She has been scheduled to see Dr. Elsworth Soho on 09/04/19 at 1330. Nothing further was needed.

## 2019-07-25 DIAGNOSIS — J9612 Chronic respiratory failure with hypercapnia: Secondary | ICD-10-CM | POA: Diagnosis not present

## 2019-08-14 ENCOUNTER — Telehealth: Payer: Self-pay | Admitting: Pulmonary Disease

## 2019-08-14 MED ORDER — SPIRIVA RESPIMAT 2.5 MCG/ACT IN AERS: INHALATION_SPRAY | RESPIRATORY_TRACT | 0 refills | Status: DC

## 2019-08-14 NOTE — Telephone Encounter (Signed)
ok 

## 2019-08-14 NOTE — Telephone Encounter (Signed)
Called and left a vm for patient letting her know that her inhaler, spirivia respimat had been sent to her pharmacy.  Nothing further needed.

## 2019-08-14 NOTE — Telephone Encounter (Signed)
Pt. Requesting 1 refill of spirivia respimatt to get her to her 09/04/19 appointment for f/u.  Pt. States she has been out since Saturday.  Please advise if ok to refill.  Thank you.

## 2019-08-22 DIAGNOSIS — Z23 Encounter for immunization: Secondary | ICD-10-CM | POA: Diagnosis not present

## 2019-08-22 DIAGNOSIS — Z0000 Encounter for general adult medical examination without abnormal findings: Secondary | ICD-10-CM | POA: Diagnosis not present

## 2019-08-22 DIAGNOSIS — R252 Cramp and spasm: Secondary | ICD-10-CM | POA: Diagnosis not present

## 2019-08-22 DIAGNOSIS — E7800 Pure hypercholesterolemia, unspecified: Secondary | ICD-10-CM | POA: Diagnosis not present

## 2019-08-22 DIAGNOSIS — D170 Benign lipomatous neoplasm of skin and subcutaneous tissue of head, face and neck: Secondary | ICD-10-CM | POA: Diagnosis not present

## 2019-08-22 DIAGNOSIS — J449 Chronic obstructive pulmonary disease, unspecified: Secondary | ICD-10-CM | POA: Diagnosis not present

## 2019-08-22 DIAGNOSIS — I5032 Chronic diastolic (congestive) heart failure: Secondary | ICD-10-CM | POA: Diagnosis not present

## 2019-08-25 DIAGNOSIS — J9612 Chronic respiratory failure with hypercapnia: Secondary | ICD-10-CM | POA: Diagnosis not present

## 2019-08-31 ENCOUNTER — Emergency Department (HOSPITAL_COMMUNITY): Payer: Medicare HMO

## 2019-08-31 ENCOUNTER — Emergency Department (HOSPITAL_COMMUNITY)
Admission: EM | Admit: 2019-08-31 | Discharge: 2019-09-01 | Disposition: A | Discharge status: Home / Self Care | Payer: Medicare HMO | Attending: Emergency Medicine | Admitting: Emergency Medicine

## 2019-08-31 ENCOUNTER — Encounter (HOSPITAL_COMMUNITY): Payer: Self-pay | Admitting: Emergency Medicine

## 2019-08-31 ENCOUNTER — Other Ambulatory Visit: Payer: Self-pay

## 2019-08-31 DIAGNOSIS — Z7951 Long term (current) use of inhaled steroids: Secondary | ICD-10-CM | POA: Diagnosis not present

## 2019-08-31 DIAGNOSIS — R072 Precordial pain: Secondary | ICD-10-CM | POA: Diagnosis not present

## 2019-08-31 DIAGNOSIS — I700 Atherosclerosis of aorta: Secondary | ICD-10-CM | POA: Diagnosis not present

## 2019-08-31 DIAGNOSIS — Z7982 Long term (current) use of aspirin: Secondary | ICD-10-CM | POA: Insufficient documentation

## 2019-08-31 DIAGNOSIS — I5032 Chronic diastolic (congestive) heart failure: Secondary | ICD-10-CM | POA: Diagnosis not present

## 2019-08-31 DIAGNOSIS — R079 Chest pain, unspecified: Secondary | ICD-10-CM | POA: Diagnosis not present

## 2019-08-31 DIAGNOSIS — Z87891 Personal history of nicotine dependence: Secondary | ICD-10-CM | POA: Insufficient documentation

## 2019-08-31 DIAGNOSIS — R0789 Other chest pain: Secondary | ICD-10-CM | POA: Diagnosis not present

## 2019-08-31 DIAGNOSIS — Z91040 Latex allergy status: Secondary | ICD-10-CM | POA: Diagnosis not present

## 2019-08-31 DIAGNOSIS — J441 Chronic obstructive pulmonary disease with (acute) exacerbation: Secondary | ICD-10-CM | POA: Insufficient documentation

## 2019-08-31 LAB — CBC
HCT: 40.1 % (ref 36.0–46.0)
Hemoglobin: 13 g/dL (ref 12.0–15.0)
MCH: 32.5 pg (ref 26.0–34.0)
MCHC: 32.4 g/dL (ref 30.0–36.0)
MCV: 100.3 fL — ABNORMAL HIGH (ref 80.0–100.0)
Platelets: 240 10*3/uL (ref 150–400)
RBC: 4 MIL/uL (ref 3.87–5.11)
RDW: 12.1 % (ref 11.5–15.5)
WBC: 8.4 10*3/uL (ref 4.0–10.5)
nRBC: 0 % (ref 0.0–0.2)

## 2019-08-31 LAB — BASIC METABOLIC PANEL
Anion gap: 10 (ref 5–15)
BUN: 8 mg/dL (ref 8–23)
CO2: 26 mmol/L (ref 22–32)
Calcium: 9.6 mg/dL (ref 8.9–10.3)
Chloride: 99 mmol/L (ref 98–111)
Creatinine, Ser: 0.63 mg/dL (ref 0.44–1.00)
GFR calc Af Amer: >60 mL/min (ref >60)
GFR calc non Af Amer: >60 mL/min (ref >60)
Glucose, Bld: 117 mg/dL — ABNORMAL HIGH (ref 70–99)
Potassium: 4.1 mmol/L (ref 3.5–5.1)
Sodium: 135 mmol/L (ref 135–145)

## 2019-08-31 LAB — TROPONIN I (HIGH SENSITIVITY): Troponin I (High Sensitivity): 3 ng/L (ref <18)

## 2019-08-31 NOTE — ED Triage Notes (Signed)
Pt has history COPD. Pt has c/o of left chest pain, and pain in her left arm. Pt has been having chest pressure since last Friday. Pain has worsened today.

## 2019-09-01 LAB — TROPONIN I (HIGH SENSITIVITY): Troponin I (High Sensitivity): 5 ng/L (ref <18)

## 2019-09-01 NOTE — Discharge Instructions (Addendum)
Make an appointment to follow-up with cardiology earlier than your October appointment.  And keep your appointment with pulmonary medicine.  Return for any worse chest pain.  Work-up today without any acute findings regarding the chest pain you have had for the past 1-1/2 weeks.

## 2019-09-01 NOTE — ED Provider Notes (Signed)
[facility] EMERGENCY DEPARTMENT Provider Note   CSN: [id number] Arrival date & time: 08/31/19  2032     History Chief Complaint  Patient presents with  . Chest Pain    Joanne Rogers is a 74 y.o. female.  Patient with a history of COPD followed by both cardiology as well as pulmonary medicine.  Known to have some very mild diastolic heart dysfunction.  Patient with complaint of left chest pain that goes to the left arm that is been ongoing for 1-1/2 weeks.  Patient denies to me that it is gotten any worse here lately.  Patient had a long wait in the waiting room troponins x2 were both normal.  Chest x-ray normal.  Patient's oxygen saturations on room air around 95%.  Patient in no acute distress.  Pain not made worse by moving left arm.  Patient did recently have the pneumonia shot in the left arm but she says she has had pain like this before but usually it is gone away with Tylenol so she does not think it is related to the shot.        Past Medical History:  Diagnosis Date  . Arthritis    back, hands, neck  . COPD (chronic obstructive pulmonary disease) ([place])   . DDD (degenerative disc disease), lumbar   . Dyspnea   . H/O bronchitis    allergy related  . Hypercholesterolemia   . Palpitations    "fluttering"  . Pneumonia    hx of    Patient Active Problem List   Diagnosis Date Noted  . Upper airway cough syndrome 06/01/2016  . Mixed hyperlipidemia 12/20/2015  . Chronic respiratory failure with hypoxia and hypercapnia ([place]) 07/18/2015  . Snoring 02/19/2015  . COPD exacerbation ([place]) 02/19/2015  . Spinal stenosis, lumbar region, with neurogenic claudication 07/20/2014  . Chronic diastolic CHF (congestive heart failure) ([place]) 12/14/2013  . HYPERCHOLESTEROLEMIA 12/13/2009  . COPD GOLD III 12/13/2009  . Palpitations 12/13/2009    Past Surgical History:  Procedure Laterality Date  . CARDIOVASCULAR STRESS TEST  11/16/2005   EF 70%, NO ISCHEMIA  .  CARPAL TUNNEL RELEASE Right ~1988  . DILATION AND CURETTAGE OF UTERUS     x3: x2 for miscarrages and x1 for polyp  . HEMI-MICRODISCECTOMY LUMBAR LAMINECTOMY LEVEL 1 Left 07/20/2014   Procedure: centeral decompression,L4-L5, HEMI-LAMINECTOMY MICRODISCECTOMY L4 - L5 ON THE LEFT  LEVEL 1, forimatomy 4-5 root on the left;  Surgeon: Latanya Maudlin, MD;  Location: [facility];  Service: Orthopedics;  Laterality: Left;  . US ECHOCARDIOGRAPHY  11/19/2005   EF 55-60%     OB History   No obstetric history on file.     Family History  Problem Relation Age of Onset  . Breast cancer Mother 75  . Emphysema Maternal Grandfather     Social History   Tobacco Use  . Smoking status: Former Smoker    Packs/day: 0.50    Years: 25.00    Pack years: 12.50    Types: Cigarettes    Quit date: 05/16/2005    Years since quitting: 14.3  . Smokeless tobacco: Never Used  Vaping Use  . Vaping Use: Never used  Substance Use Topics  . Alcohol use: Yes    Alcohol/week: 6.0 standard drinks    Types: 6 Standard drinks or equivalent per week    Comment: social  . Drug use: No    Home Medications Prior to Admission medications   Medication Sig Start Date End Date  Taking? Authorizing Provider  albuterol (VENTOLIN HFA) 108 (90 Base) MCG/ACT inhaler Inhale 1-2 puffs into the lungs every 4 (four) hours as needed for wheezing or shortness of breath. 06/29/18   Tanda Rockers, MD  aspirin 81 MG tablet Take 81 mg by mouth at bedtime.     [provider]  azelastine (ASTELIN) 0.1 % nasal spray Place 1 spray into both nostrils as needed.    [provider]  beta carotene w/minerals (OCUVITE) tablet Take 1 tablet by mouth at bedtime.    [provider]  bisoprolol (ZEBETA) 5 MG tablet Take 1 tablet (5 mg total) by mouth daily. 05/03/19   Nahser, Wonda Cheng, MD  budesonide-formoterol (SYMBICORT) 80-4.5 MCG/ACT inhaler Inhale 2 puffs into the lungs 2 (two) times daily. 06/08/18   Tanda Rockers, MD   cetirizine (ZYRTEC) 10 MG tablet Take 10 mg by mouth at bedtime.    [provider]  famotidine (PEPCID) 20 MG tablet One at bedtime 06/08/18   Tanda Rockers, MD  ipratropium-albuterol (DUONEB) 0.5-2.5 (3) MG/3ML SOLN Take 3 mLs by nebulization every 4 (four) hours. Dx: J44.9 06/30/18   Tanda Rockers, MD  Multiple Minerals-Vitamins (CALCIUM-MAGNESIUM-ZINC-D3) TABS Take 3 tablets by mouth daily.    [provider]  OXYGEN 2lpm with sleep    [provider]  pantoprazole (PROTONIX) 40 MG tablet Take 1 tablet (40 mg total) by mouth daily. Take 30-60 min before first meal of the day 06/08/18   Tanda Rockers, MD  Respiratory Therapy Supplies (FLUTTER) DEVI 1 Device by Does not apply route as needed. 05/11/16   Tanda Rockers, MD  Tiotropium Bromide Monohydrate (SPIRIVA RESPIMAT) 2.5 MCG/ACT AERS INHALE 2 PUFFS INTO THE LUNGS EVERY DAY 08/14/19   Tanda Rockers, MD  valACYclovir (VALTREX) 1000 MG tablet Take 500 mg by mouth every other day.     [provider]    Allergies    Amoxicillin, Codeine, Crestor [rosuvastatin calcium], Levofloxacin, Lovastatin, Simvastatin, Statins, and Latex  Review of Systems   Review of Systems  Constitutional: Negative for chills and fever.  HENT: Negative for congestion, rhinorrhea and sore throat.   Eyes: Negative for visual disturbance.  Respiratory: Negative for cough and shortness of breath.   Cardiovascular: Positive for chest pain. Negative for leg swelling.  Gastrointestinal: Negative for abdominal pain, diarrhea, nausea and vomiting.  Genitourinary: Negative for dysuria.  Musculoskeletal: Negative for back pain and neck pain.  Skin: Negative for rash.  Neurological: Negative for dizziness, light-headedness and headaches.  Hematological: Does not bruise/bleed easily.  Psychiatric/Behavioral: Negative for confusion.    Physical Exam Updated Vital Signs BP (!) 146/63 (BP Location: Left Arm)   Pulse 67   Temp 98  F (36.7 C) (Oral)   Resp (!) 24   Ht 1.524 m (5')   Wt 74.1 kg   SpO2 97%   BMI 31.90 kg/m   Physical Exam Vitals and nursing note reviewed.  Constitutional:      General: She is not in acute distress.    Appearance: Normal appearance. She is well-developed.  HENT:     Head: Normocephalic and atraumatic.  Eyes:     Extraocular Movements: Extraocular movements intact.     Conjunctiva/sclera: Conjunctivae normal.     Pupils: Pupils are equal, round, and reactive to light.  Cardiovascular:     Rate and Rhythm: Normal rate and regular rhythm.     Heart sounds: No murmur heard.   Pulmonary:  Effort: Pulmonary effort is normal. No respiratory distress.     Breath sounds: Normal breath sounds. No wheezing.  Abdominal:     Palpations: Abdomen is soft.     Tenderness: There is no abdominal tenderness.  Musculoskeletal:        General: No swelling. Normal range of motion.     Cervical back: Normal range of motion and neck supple.  Skin:    General: Skin is warm and dry.     Capillary Refill: Capillary refill takes less than 2 seconds.  Neurological:     General: No focal deficit present.     Mental Status: She is alert and oriented to person, place, and time.     Cranial Nerves: No cranial nerve deficit.     Sensory: No sensory deficit.     Motor: No weakness.     ED Results / Procedures / Treatments   Labs (all labs ordered are listed, but only abnormal results are displayed) Labs Reviewed  BASIC METABOLIC PANEL - Abnormal; Notable for the following components:      Result Value   Glucose, Bld 117 (*)    All other components within normal limits  CBC - Abnormal; Notable for the following components:   MCV 100.3 (*)    All other components within normal limits  TROPONIN I (HIGH SENSITIVITY)  TROPONIN I (HIGH SENSITIVITY)    EKG EKG Interpretation  Date/Time:  Thursday August 31 2019 20:43:03 EDT Ventricular Rate:  72 PR Interval:  184 QRS Duration: 80 QT  Interval:  424 QTC Calculation: 464 R Axis:   -23 Text Interpretation: Normal sinus rhythm Nonspecific ST and T wave abnormality Abnormal ECG Artifact Confirmed by Fredia Sorrow [phone]) on 09/01/2019 8:35:04 AM   Radiology DG Chest 2 View  Result Date: 08/31/2019 CLINICAL DATA:  Chest pain. EXAM: CHEST - 2 VIEW COMPARISON:  October 07, 2017 FINDINGS: The lungs are hyperinflated. Mild diffuse chronic appearing increased interstitial lung markings are noted. There is mild, stable right basilar linear scarring. There is no evidence of acute infiltrate, pleural effusion or pneumothorax. The heart size and mediastinal contours are within normal limits. There is mild calcification of the aortic arch. The visualized skeletal structures are unremarkable. IMPRESSION: Chronic appearing increased interstitial lung markings without evidence of acute or active cardiopulmonary disease. Electronically Signed   By: Virgina Norfolk M.D.   On: 08/31/2019 21:19    Procedures Procedures (including critical care time)  Medications Ordered in ED Medications - No data to display  ED Course  I have reviewed the triage vital signs and the nursing notes.  Pertinent labs & imaging results that were available during my care of the patient were reviewed by me and considered in my medical decision making (see chart for details).    MDM Rules/Calculators/A&P                          Work-up without any acute findings.  Troponins x2 were normal.  No significant lab abnormalities.  EKG without acute changes.  Chest x-ray negative for pneumonia or pneumothorax.  Based on the duration of the pain feel that patient is stable for discharge home follow-up with cardiology earlier than her October appointment.  And also follow back up with her pulmonologist.  If a cause of the chest pain is not clear.  But appears not to be related to a cardiac event.  Final Clinical Impression(s) / ED Diagnoses Final diagnoses:  Precordial pain    Rx / DC Orders ED Discharge Orders    None       Fredia Sorrow, MD 09/01/19 [phone]

## 2019-09-04 ENCOUNTER — Other Ambulatory Visit: Payer: Self-pay

## 2019-09-04 ENCOUNTER — Encounter: Payer: Self-pay | Admitting: Pulmonary Disease

## 2019-09-04 ENCOUNTER — Ambulatory Visit: Payer: Medicare HMO | Admitting: Pulmonary Disease

## 2019-09-04 VITALS — BP 118/82 | HR 68 | Temp 98.3°F | Ht 66.0 in | Wt 160.0 lb

## 2019-09-04 DIAGNOSIS — J449 Chronic obstructive pulmonary disease, unspecified: Secondary | ICD-10-CM

## 2019-09-04 DIAGNOSIS — J9611 Chronic respiratory failure with hypoxia: Secondary | ICD-10-CM

## 2019-09-04 DIAGNOSIS — J9612 Chronic respiratory failure with hypercapnia: Secondary | ICD-10-CM | POA: Diagnosis not present

## 2019-09-04 MED ORDER — SPIRIVA RESPIMAT 2.5 MCG/ACT IN AERS: 2.0000 | INHALATION_SPRAY | Freq: Every day | RESPIRATORY_TRACT | 0 refills | Status: DC

## 2019-09-04 NOTE — Progress Notes (Signed)
   Subjective:    Patient ID: Joanne Rogers, female    DOB: [birth year], 74 y.o.   MRN: [id number]  HPI  74 year old ex-smoker with COPD, previously seen by my partner Dr. Melvyn Novas, presents to establish care with me  MZ  phenotype - quit smoking 2007   -On nocturnal oxygen  Last OV 05/2018 reviewed She is compliant with Symbicort and Spiriva, uses oxygen at night, uses duo nebs about twice a week. She developed chest pain 08/31/2019, reviewed ER visit, chest x-ray shows hyperinflation and faint interstitial markings, stable compared to 2 years ago Reviewed cardiology note from 04/2019, labeled as diastolic heart failure but LV function normal on echo. Chest pain is now resolved, attributes this to stressors, daughter's boyfriend had Covid,  She is active, walks 15 minutes daily and uses stationary bike another 15 minutes. No flareup in the last 2 years.    Significant tests/ events reviewed   -  PFT's 11/2009 FEV1 0.77 (29%) with ratio 46 and 12% p B2 and DLCO 57  -  HC03  33  07/17/14  - 07/18/2015    change to spiriva respimat  - PFT's  07/2015  1.18  FEV1 1.18 (49 % ) ratio 58  p 16 % improvement from saba p nothing prior to study with DLCO  43 % corrects to 76 % for alv volume    - 05/11/2016 added flutter valve   - alpha one AT  04/06/2017   MZ   Level 95   Review of Systems Patient denies significant dyspnea,cough, hemoptysis,  chest pain, palpitations, pedal edema, orthopnea, paroxysmal nocturnal dyspnea, lightheadedness, nausea, vomiting, abdominal or  leg pains      Objective:   Physical Exam  Gen. Pleasant, well-nourished, in no distress ENT - no thrush, no pallor/icterus,no post nasal drip Neck: No JVD, no thyromegaly, no carotid bruits Lungs: no use of accessory muscles, no dullness to percussion, clear without rales or rhonchi  Cardiovascular: Rhythm regular, heart sounds  normal, no murmurs or gallops, no peripheral edema Musculoskeletal: No deformities, no cyanosis or  clubbing         Assessment & Plan:

## 2019-09-04 NOTE — Assessment & Plan Note (Signed)
Continue Symbicort and Spiriva We discussed signs and symptoms of COPD flareup. She can use albuterol as needed. I wonder she has MZ with borderline alpha-1 levels, doubt that she needs replacement Lung function appears to have been stable

## 2019-09-04 NOTE — Patient Instructions (Signed)
  Stay on Symbicort and Spiriva. Use nebs as needed.  Stay active with walking and stationary bike We discussed signs and symptoms of COPD flareup  Call Dr. Acie Fredrickson if chest pain recurs

## 2019-09-04 NOTE — Assessment & Plan Note (Signed)
Continue nocturnal oxygen Immunizations up-to-date

## 2019-09-05 ENCOUNTER — Other Ambulatory Visit: Payer: Self-pay | Admitting: Radiology

## 2019-09-05 ENCOUNTER — Other Ambulatory Visit: Payer: Self-pay | Admitting: Family Medicine

## 2019-09-05 DIAGNOSIS — Z0000 Encounter for general adult medical examination without abnormal findings: Secondary | ICD-10-CM

## 2019-09-21 DIAGNOSIS — D170 Benign lipomatous neoplasm of skin and subcutaneous tissue of head, face and neck: Secondary | ICD-10-CM | POA: Insufficient documentation

## 2019-09-21 DIAGNOSIS — J449 Chronic obstructive pulmonary disease, unspecified: Secondary | ICD-10-CM | POA: Diagnosis not present

## 2019-09-26 DIAGNOSIS — H5203 Hypermetropia, bilateral: Secondary | ICD-10-CM | POA: Diagnosis not present

## 2019-09-26 DIAGNOSIS — H2513 Age-related nuclear cataract, bilateral: Secondary | ICD-10-CM | POA: Diagnosis not present

## 2019-09-26 DIAGNOSIS — H4720 Unspecified optic atrophy: Secondary | ICD-10-CM | POA: Diagnosis not present

## 2019-09-26 DIAGNOSIS — Z23 Encounter for immunization: Secondary | ICD-10-CM | POA: Diagnosis not present

## 2019-10-16 ENCOUNTER — Ambulatory Visit
Admission: RE | Admit: 2019-10-16 | Discharge: 2019-10-16 | Disposition: A | Discharge status: Home / Self Care | Payer: Medicare HMO | Source: Ambulatory Visit | Attending: Family Medicine | Admitting: Family Medicine

## 2019-10-16 ENCOUNTER — Other Ambulatory Visit: Payer: Self-pay

## 2019-10-16 DIAGNOSIS — Z0000 Encounter for general adult medical examination without abnormal findings: Secondary | ICD-10-CM

## 2019-10-16 DIAGNOSIS — Z1231 Encounter for screening mammogram for malignant neoplasm of breast: Secondary | ICD-10-CM | POA: Diagnosis not present

## 2019-10-27 DIAGNOSIS — L820 Inflamed seborrheic keratosis: Secondary | ICD-10-CM | POA: Diagnosis not present

## 2019-10-27 DIAGNOSIS — Z85828 Personal history of other malignant neoplasm of skin: Secondary | ICD-10-CM | POA: Diagnosis not present

## 2019-11-07 ENCOUNTER — Encounter: Payer: Self-pay | Admitting: Cardiovascular Disease

## 2019-11-07 DIAGNOSIS — R238 Other skin changes: Secondary | ICD-10-CM | POA: Diagnosis not present

## 2019-11-07 NOTE — Progress Notes (Signed)
Joanne Rogers Savin Date of Birth  [birth year] [facility] [address]    [name], [place]  [address] [phone]  Fax  [phone]  Problem List 1. Hyperlipidemia 2. Chronic lung disease,   Asthma  3. Chronic diastolic CHF 4.       Joanne Rogers is a 74 y.o. female with a hx of dyspnea.  She had a normal myoview in 2007.  She has a hx of hyperliidemia.   She had recent lipids but they are not available at this time.  She has been diagnosed with COPD.  She denies any chest pain.  She had some chest wall / breast pain during there last mamogram on July.  She is exercising 2-3 times a week.  No CP or dyspnea.  Dec. 2, 1607:   Ica is doing well.  Riding bike and exercising regularly.   Looking for a new job.  No CP or palps  Dec. 3, 3710: Joanne Rogers is doing ok.  No CP,  She has chronic shortness of breath. Has bad allergies. She has normal LV function by echo in 2007.    Dec. 6, 2016:  Doing ok from cardiac standpoint.  Has gained some weight.  Exercising regularly  breathin is ok  Dec. 7, 6269:  Joanne Rogers is seen today for follow up of her shortness of breath. She has normal left ventricle systolic function by echo. She was found have grade 1 diastolic dysfunction. Just getting over bronchitis - is still short of breath .   March 29, 4852:   Joanne Rogers is seen today for follow up of her chronic diastolic congestive heart failure. Echocardiogram from December, 2015 reveals normal left ventricular systolic function.  She has grade 1 diastolic dysfunction.  She fell 5 weeks ago and broke her right fibula.    Is wearing a small brace now   she has lost some weight  May 02, 6268:  Joanne Rogers is seen today for follow up of her diastolic CHF.  Is active.  walks 5000 steps a day . Her neighborhood has lots [place] feel so she is fairly tired when she gets through with her walk. No syncope or  CP .   Just gets fatigued.  Was very short of breath walking from the back of the office to  the exam room  Also has COPD.    I reviewed her PFTs .   Has significnant reduction of  FEV1 of 1.01  FEF 25-75 is markedly reduced.   Her last echocardiogram was 2015.  She had normal left ventricular systolic function at that time and on bad mild diastolic dysfunction.  Her symptoms seem to be out of proportion relative to her degree of mild diastolic congestive heart failure.  Oct. 27, 3500: Joanne Rogers is seen today for follow up of her diastolic CHF, severe COPD, severely reduced diffusion capacity. Had some chest pain in Aug.   Started during her everyday activity.  "Elephant on her chest " Lasted for many hours  Went to the ER.   Troponin levels   were normal .  Was told to follow up with [place] . Albuterol may have helped some  Last stress myoivew was 2007.     Current Outpatient Medications on File Prior to Visit  Medication Sig Dispense Refill  . albuterol (VENTOLIN HFA) 108 (90 Base) MCG/ACT inhaler Inhale 1-2 puffs into the lungs every 4 (four) hours as needed for wheezing or shortness of breath. 1 Inhaler 5  . Ascorbic Acid (VITAMIN  C PO) Take 1 tablet by mouth daily.    Marland Kitchen aspirin 81 MG tablet Take 81 mg by mouth at bedtime.     Marland Kitchen azelastine (ASTELIN) 0.1 % nasal spray Place 1 spray into both nostrils as needed.    . beta carotene w/minerals (OCUVITE) tablet Take 1 tablet by mouth at bedtime.    . bisoprolol (ZEBETA) 5 MG tablet Take 1 tablet (5 mg total) by mouth daily. 90 tablet 3  . cetirizine (ZYRTEC) 10 MG tablet Take 10 mg by mouth at bedtime.    . famotidine (PEPCID) 20 MG tablet One at bedtime 30 tablet 11  . ipratropium-albuterol (DUONEB) 0.5-2.5 (3) MG/3ML SOLN Take 3 mLs by nebulization every 4 (four) hours. Dx: J44.9 360 mL 0  . Multiple Minerals-Vitamins (CALCIUM-MAGNESIUM-ZINC-D3) TABS Take 3 tablets by mouth daily.    . OXYGEN 2lpm with sleep    . pantoprazole (PROTONIX) 40 MG tablet Take 1 tablet (40 mg total) by mouth daily. Take 30-60 min before first meal of the  day 30 tablet 2  . Respiratory Therapy Supplies (FLUTTER) DEVI 1 Device by Does not apply route as needed. 1 each 0  . SYMBICORT 160-4.5 MCG/ACT inhaler Inhale 2 puffs into the lungs as needed.    . Tiotropium Bromide Monohydrate (SPIRIVA RESPIMAT) 2.5 MCG/ACT AERS Inhale 2 puffs into the lungs daily. 4 g 0  . valACYclovir (VALTREX) 1000 MG tablet Take 500 mg by mouth every other day.      No current facility-administered medications on file prior to visit.    Allergies  Allergen Reactions  . Amoxicillin Nausea And Vomiting  . Codeine     REACTION: hives  . Crestor [Rosuvastatin Calcium]     Ache, depression.   . Levofloxacin Nausea Only  . Lovastatin Other (See Comments)    Ache, depression.   . Simvastatin Other (See Comments)    Ache, depression.   . Statins Other (See Comments)    Muscle aches  . Latex Hives and Rash    Past Medical History:  Diagnosis Date  . Arthritis    back, hands, neck  . COPD (chronic obstructive pulmonary disease) ([place])   . DDD (degenerative disc disease), lumbar   . Dyspnea   . H/O bronchitis    allergy related  . Hypercholesterolemia   . Palpitations    "fluttering"  . Pneumonia    hx of    Past Surgical History:  Procedure Laterality Date  . CARDIOVASCULAR STRESS TEST  11/16/2005   EF 70%, NO ISCHEMIA  . CARPAL TUNNEL RELEASE Right ~1988  . DILATION AND CURETTAGE OF UTERUS     x3: x2 for miscarrages and x1 for polyp  . HEMI-MICRODISCECTOMY LUMBAR LAMINECTOMY LEVEL 1 Left 07/20/2014   Procedure: centeral decompression,L4-L5, HEMI-LAMINECTOMY MICRODISCECTOMY L4 - L5 ON THE LEFT  LEVEL 1, forimatomy 4-5 root on the left;  Surgeon: Latanya Maudlin, MD;  Location: [facility];  Service: Orthopedics;  Laterality: Left;  . US ECHOCARDIOGRAPHY  11/19/2005   EF 55-60%    Social History   Tobacco Use  Smoking Status Former Smoker  . Packs/day: 0.50  . Years: 25.00  . Pack years: 12.50  . Types: Cigarettes  . Quit date: 05/16/2005  . Years since  quitting: 14.4  Smokeless Tobacco Never Used    Social History   Substance and Sexual Activity  Alcohol Use Yes  . Alcohol/week: 6.0 standard drinks  . Types: 6 Standard drinks or equivalent per week   Comment: social  Family History  Problem Relation Age of Onset  . Breast cancer Mother 19  . Emphysema Maternal Grandfather     Reviw of Systems:  Reviewed in the HPI.  All other systems are negative.    Physical Exam: Blood pressure 112/60, pulse 68, height 5\' 6"  (1.676 m), weight 158 lb 6.4 oz (71.8 kg), SpO2 95 %.  GEN:  Well nourished, well developed in no acute distress HEENT: Normal NECK: No JVD; No carotid bruits LYMPHATICS: No lymphadenopathy CARDIAC: RRR , no murmurs, rubs, gallops RESPIRATORY:  Clear to auscultation without rales, wheezing or rhonchi  ABDOMEN: Soft, non-tender, non-distended MUSCULOSKELETAL:  No edema; No deformity  SKIN: Warm and dry NEUROLOGIC:  Alert and oriented x 3   ECG: May 03, 2019:    Assessment / Plan:   1. Chest pressure:   Pt had an intense episode of chest pressure in aug.   Troponin levels were normal. She has COPD but this sounds more like unstable angina.  Will get a coronary CT angiogram for further evaluation .   2. Hyperlipidemia:     LDL is 201.  She is intolerant to statins.   Will refer to lipid clinic for consideration of alternative therapies including PCSK9 inhibitors.  Encouraged continued weight loss.    3. Bronchitis / COPD -       Mertie Moores, MD  11/08/2019 1:34 PM    [facility] [place],  [employer] [place], [place]  [address] Pager [phone] Phone: [phone]; Fax: [phone]

## 2019-11-08 ENCOUNTER — Other Ambulatory Visit: Payer: Self-pay

## 2019-11-08 ENCOUNTER — Ambulatory Visit: Payer: Medicare HMO | Admitting: Cardiovascular Disease

## 2019-11-08 ENCOUNTER — Encounter: Payer: Self-pay | Admitting: Cardiovascular Disease

## 2019-11-08 VITALS — BP 112/60 | HR 68 | Ht 66.0 in | Wt 158.4 lb

## 2019-11-08 DIAGNOSIS — R079 Chest pain, unspecified: Secondary | ICD-10-CM

## 2019-11-08 DIAGNOSIS — I5032 Chronic diastolic (congestive) heart failure: Secondary | ICD-10-CM

## 2019-11-08 DIAGNOSIS — E782 Mixed hyperlipidemia: Secondary | ICD-10-CM | POA: Diagnosis not present

## 2019-11-08 DIAGNOSIS — E7800 Pure hypercholesterolemia, unspecified: Secondary | ICD-10-CM

## 2019-11-08 MED ORDER — METOPROLOL TARTRATE 50 MG PO TABS: 50.0000 mg | ORAL_TABLET | ORAL | 0 refills | Status: DC

## 2019-11-08 NOTE — Patient Instructions (Addendum)
Medication Instructions:  TAKE METOPROLOL 2 HOURS BEFORE CARDIAC CT  *If you need a refill on your cardiac medications before your next appointment, please call your pharmacy*   Lab Work: NONE If you have labs (blood work) drawn today and your tests are completely normal, you will receive your results only by: Marland Kitchen MyChart Message (if you have MyChart) OR . A paper copy in the mail If you have any lab test that is abnormal or we need to change your treatment, we will call you to review the results.   Testing/Procedures: Your cardiac CT will be scheduled at one of the below locations:   [facility] [address] [place], [place] [address] [phone]  If scheduled at [facility], please arrive at the [facility] main entrance of [facility] 30 minutes prior to test start time. Proceed to the [facility] Radiology Department (first floor) to check-in and test prep.  If scheduled at [facility], please arrive 15 mins early for check-in and test prep.  Please follow these instructions carefully (unless otherwise directed):  Hold all erectile dysfunction medications at least 3 days (72 hrs) prior to test.  On the Night Before the Test: . Be sure to Drink plenty of water. . Do not consume any caffeinated/decaffeinated beverages or chocolate 12 hours prior to your test. . Do not take any antihistamines 12 hours prior to your test. . If the patient has contrast allergy: ? Patient will need a prescription for Prednisone and very clear instructions (as follows): 1. Prednisone 50 mg - take 13 hours prior to test 2. Take another Prednisone 50 mg 7 hours prior to test 3. Take another Prednisone 50 mg 1 hour prior to test 4. Take Benadryl 50 mg 1 hour prior to test . Patient must complete all four doses of above prophylactic medications. . Patient will need a ride after test due to Benadryl.  On the Day of the Test: . Drink plenty of  water. Do not drink any water within one hour of the test. . Do not eat any food 4 hours prior to the test. . You may take your regular medications prior to the test.  . Take metoprolol (Lopressor) two hours prior to test. . FEMALES- please wear underwire-free bra if available   *For Clinical Staff only. Please instruct patient the following:*        -Drink plenty of water       -      -Take metoprolol (Lopressor) 2 hours prior to test (if applicable).                  -If HR is less than 55 BPM- No Lopressor                -IF HR is greater than 55 BPM and patient is less than or equal to 53 yrs old Lopressor 164m x1.                -If HR is greater than 55 BPM and patient is greater than 732yrs old Lopressor 50 mg x1.     Do not give Lopressor to patients with an allergy to lopressor or anyone with asthma or active COPD symptoms (currently taking steroids).       After the Test: . Drink plenty of water. . After receiving IV contrast, you may experience a mild flushed feeling. This is normal. . On occasion, you may experience a mild  rash up to 24 hours after the test. This is not dangerous. If this occurs, you can take Benadryl 25 mg and increase your fluid intake. . If you experience trouble breathing, this can be serious. If it is severe call 911 IMMEDIATELY. If it is mild, please call our office. . If you take any of these medications: Glipizide/Metformin, Avandament, Glucavance, please do not take 48 hours after completing test unless otherwise instructed.   Once we have confirmed authorization from your insurance company, we will call you to set up a date and time for your test. Based on how quickly your insurance processes prior authorizations requests, please allow up to 4 weeks to be contacted for scheduling your Cardiac CT appointment. Be advised that routine Cardiac CT appointments could be scheduled as many as 8 weeks after your provider has ordered it.  For non-scheduling  related questions, please contact the cardiac imaging nurse navigator should you have any questions/concerns: Marchia Bond, Cardiac Imaging Nurse Navigator Burley Saver, Interim Cardiac Imaging Nurse [place] [facility] V[facility] Services Direct Office Dial: [phone]   For scheduling needs, including cancellations and rescheduling, please call Vivien Rota at [phone], option [phone].     Follow-Up: At [facility], you and your health needs are our priority.  As part of our continuing mission to provide you with exceptional heart care, we have created designated Provider Care Teams.  These Care Teams include your primary Cardiologist (physician) and Advanced Practice Providers (APPs -  Physician Assistants and Nurse Practitioners) who all work together to provide you with the care you need, when you need it.  We recommend signing up for the patient portal called "MyChart".  Sign up information is provided on this After Visit Summary.  MyChart is used to connect with patients for Virtual Visits (Telemedicine).  Patients are able to view lab/test results, encounter notes, upcoming appointments, etc.  Non-urgent messages can be sent to your provider as well.   To learn more about what you can do with MyChart, go to [email or web address].    Your next appointment:   6 month(s)  The format for your next appointment:   In Person  Provider:   Mertie Moores, MD   Other Instructions REFERRAL TO LIPID CLINIC

## 2019-11-13 NOTE — Progress Notes (Signed)
Patient ID: [name]                 DOB: [birth year]                    MRN: [id number]     HPI: Joanne Rogers is a 74 y.o. female patient referred to lipid clinic by Dr. Acie Rogers. PMH is significant for HLD, COPD, chronic diastolic CHF (EF 55 to 55% 05/2019). Last seen by Dr. Cathie Rogers on 11/08/19 in which patient reported some chest pain in August 2021 like an "elephant on her chest" that lasted for many hours. Visited ER and troponins were negative. Coronary CT angiogram ordered for further evaluation on 11/28/19.   Patient presents today in good spirits. Reports not currently on any lipid-lowering medications. Reports statin intolerance and experienced fatigue, muscle aches, and depression with lovastatin, simvastatin, and rosuvastatin. Reports last time on statin was several years ago and cannot recall doses. Patient could not recall why she stopped Zetia years ago, believes it was due to expensive cost at the time. Reports on a "semi-vegan" diet with very limited red meat for ~ 2.5 months now. Additionally, patient reports she is in the donut hole, pays ~$300 for a 90-day supply of Spiriva.  Current Medications: none Intolerances: ezetimibe 10 mg daily, Niaspan CR 1000 mg daily, lovastatin, simvastatin, and rosuvastatin (aches, depression) Risk Factors: HLD, CHF, former smoker LDL goal: pending CT coronary angiogram  Diet: Plant-based diet; very limit red meat  Family History: non-contributory  Social History: former smoker (quit 2007 - 0.5 ppd)  Labs: 08/22/19: LDL 201, TC 283, TG 121, HDL 60 (no therapy; [place] office)  Past Medical History:  Diagnosis Date  . Arthritis    back, hands, neck  . COPD (chronic obstructive pulmonary disease) ([place])   . DDD (degenerative disc disease), lumbar   . Dyspnea   . H/O bronchitis    allergy related  . Hypercholesterolemia   . Palpitations    "fluttering"  . Pneumonia    hx of    Current Outpatient Medications on File Prior to Visit   Medication Sig Dispense Refill  . albuterol (VENTOLIN HFA) 108 (90 Base) MCG/ACT inhaler Inhale 1-2 puffs into the lungs every 4 (four) hours as needed for wheezing or shortness of breath. 1 Inhaler 5  . Ascorbic Acid (VITAMIN C PO) Take 1 tablet by mouth daily.    Marland Kitchen aspirin 81 MG tablet Take 81 mg by mouth at bedtime.     Marland Kitchen azelastine (ASTELIN) 0.1 % nasal spray Place 1 spray into both nostrils as needed.    . beta carotene w/minerals (OCUVITE) tablet Take 1 tablet by mouth at bedtime.    . bisoprolol (ZEBETA) 5 MG tablet Take 1 tablet (5 mg total) by mouth daily. 90 tablet 3  . cetirizine (ZYRTEC) 10 MG tablet Take 10 mg by mouth at bedtime.    . famotidine (PEPCID) 20 MG tablet One at bedtime 30 tablet 11  . ipratropium-albuterol (DUONEB) 0.5-2.5 (3) MG/3ML SOLN Take 3 mLs by nebulization every 4 (four) hours. Dx: J44.9 360 mL 0  . metoprolol tartrate (LOPRESSOR) 50 MG tablet Take 1 tablet (50 mg total) by mouth as directed. TAKE 1 TABLET 2 HOURS BEFORE CT 1 tablet 0  . Multiple Minerals-Vitamins (CALCIUM-MAGNESIUM-ZINC-D3) TABS Take 3 tablets by mouth daily.    . OXYGEN 2lpm with sleep    . pantoprazole (PROTONIX) 40 MG tablet Take 1 tablet (40 mg total) by mouth daily.  Take 30-60 min before first meal of the day 30 tablet 2  . Respiratory Therapy Supplies (FLUTTER) DEVI 1 Device by Does not apply route as needed. 1 each 0  . SYMBICORT 160-4.5 MCG/ACT inhaler Inhale 2 puffs into the lungs as needed.    . Tiotropium Bromide Monohydrate (SPIRIVA RESPIMAT) 2.5 MCG/ACT AERS Inhale 2 puffs into the lungs daily. 4 g 0  . valACYclovir (VALTREX) 1000 MG tablet Take 500 mg by mouth every other day.      No current facility-administered medications on file prior to visit.    Allergies  Allergen Reactions  . Amoxicillin Nausea And Vomiting  . Codeine     REACTION: hives  . Crestor [Rosuvastatin Calcium]     Ache, depression.   . Levofloxacin Nausea Only  . Lovastatin Other (See Comments)     Ache, depression.   . Simvastatin Other (See Comments)    Ache, depression.   . Statins Other (See Comments)    Muscle aches  . Latex Hives and Rash    Assessment/Plan:  1. Hyperlipidemia - Current LDL elevated at 201 with ASCVD risk score >17%. LDL goal pending CT coronary angiogram evaluation scheduled for 11/28/19. Patient is currently not on any lipid-lowering medication and is statin intolerant. Discussed initiating a PCSK9-inhibitor, statin re-challenge or restarting ezetimibe. Discussed Repatha's clinicial benefits, potential side-effects, and proper injection technique with teach-back method. Repatha is a tier 3 medication with [facility] Medicare PPO and patient is currently in the donut hole indicating that copay cost may be cost prohibitive. Patient declined statin re-challenge and would like to recheck lipid panel to see if her semi-vegan diet has lowered her lipids and cholesterol before starting therapy. Will order fasting lipid panel and direct LDL for Friday, 11/17/19. Additionally, patient may be a candidate for the Vesalius clinical trial pending CT coronary angiogram results. Will discuss lipid panel results with patient on Tuesday 11/9 and further discuss starting a PCSK9-inhibitor, ezetimibe, or enrolling in the Vesalius clinical trial. If starting a PCSK9-inhibitor, patient requested first dose in clinic.   Joanne Rogers, PharmD PGY2 [place] [address], [place], [place] [address] Phone: [phone]; Fax: [phone]

## 2019-11-14 ENCOUNTER — Other Ambulatory Visit: Payer: Self-pay

## 2019-11-14 ENCOUNTER — Other Ambulatory Visit: Payer: Medicare HMO | Admitting: *Deleted

## 2019-11-14 ENCOUNTER — Ambulatory Visit (INDEPENDENT_AMBULATORY_CARE_PROVIDER_SITE_OTHER): Payer: Medicare HMO | Admitting: Pharmacist

## 2019-11-14 ENCOUNTER — Other Ambulatory Visit: Payer: Self-pay | Admitting: *Deleted

## 2019-11-14 DIAGNOSIS — Z01812 Encounter for preprocedural laboratory examination: Secondary | ICD-10-CM

## 2019-11-14 DIAGNOSIS — E782 Mixed hyperlipidemia: Secondary | ICD-10-CM

## 2019-11-14 NOTE — Patient Instructions (Addendum)
Nice to see you today!  Keep up the good work with diet and exercise. Aim for a diet full of vegetables, fruit and lean meats (chicken, [place], fish). Try to limit carbs (bread, pasta, sugar, rice) and red meat consumption.  Your goal LDL is <70 mg/dL, you're currently at 201 mg/dL  Medication Changes: Fasting labs scheduled for November 5th - you can come at any time that day. I will give you a call Tuesday, November 9th to discuss results and future decision.    Please give [place] a call at [phone] with any questions or concerns.

## 2019-11-15 LAB — BASIC METABOLIC PANEL
BUN/Creatinine Ratio: 11 — ABNORMAL LOW (ref 12–28)
BUN: 7 mg/dL — ABNORMAL LOW (ref 8–27)
CO2: 30 mmol/L — ABNORMAL HIGH (ref 20–29)
Calcium: 10.4 mg/dL — ABNORMAL HIGH (ref 8.7–10.3)
Chloride: 95 mmol/L — ABNORMAL LOW (ref 96–106)
Creatinine, Ser: 0.62 mg/dL (ref 0.57–1.00)
GFR calc Af Amer: 101 mL/min/{1.73_m2} (ref >59)
GFR calc non Af Amer: 88 mL/min/{1.73_m2} (ref >59)
Glucose: 110 mg/dL — ABNORMAL HIGH (ref 65–99)
Potassium: 4.9 mmol/L (ref 3.5–5.2)
Sodium: 136 mmol/L (ref 134–144)

## 2019-11-17 ENCOUNTER — Telehealth: Payer: Self-pay

## 2019-11-17 ENCOUNTER — Other Ambulatory Visit: Payer: Self-pay

## 2019-11-17 ENCOUNTER — Other Ambulatory Visit: Payer: Medicare HMO | Admitting: *Deleted

## 2019-11-17 DIAGNOSIS — E782 Mixed hyperlipidemia: Secondary | ICD-10-CM

## 2019-11-17 LAB — LIPID PANEL
Chol/HDL Ratio: 4 ratio (ref 0.0–4.4)
Cholesterol, Total: 256 mg/dL — ABNORMAL HIGH (ref 100–199)
HDL: 64 mg/dL (ref >39)
LDL Chol Calc (NIH): 168 mg/dL — ABNORMAL HIGH (ref 0–99)
Triglycerides: 136 mg/dL (ref 0–149)
VLDL Cholesterol Cal: 24 mg/dL (ref 5–40)

## 2019-11-17 LAB — LDL CHOLESTEROL, DIRECT: LDL Direct: 171 mg/dL — ABNORMAL HIGH (ref 0–99)

## 2019-11-17 NOTE — Telephone Encounter (Signed)
Labs are stable.

## 2019-11-17 NOTE — Telephone Encounter (Signed)
-----   Message from Thayer Headings, MD sent at 11/16/2019  6:06 PM EDT ----- Labs are stable

## 2019-11-21 ENCOUNTER — Telehealth: Payer: Self-pay | Admitting: Pharmacist

## 2019-11-21 DIAGNOSIS — E782 Mixed hyperlipidemia: Secondary | ICD-10-CM

## 2019-11-21 NOTE — Telephone Encounter (Signed)
Spoke to patient regarding lipid panel results. Explained LDL and TC are still elevated and currently not on any lipid-lowering medications. Discussed she may benefit from lipid lowering therapy such as a PCSK9-inhibitor. Patient states she doesn't want to add another medication right now and wants to focus on her vegan diet. Patient has a coronary CT angiogram scheduled for 11/16 and will discuss treatment again based on the results. Patient verbalized agreement.   Will call patient and discuss lipid-lowering options again once coronary CT angiogram results are release.

## 2019-11-21 NOTE — Telephone Encounter (Signed)
Transferred call to [facility]

## 2019-11-21 NOTE — Telephone Encounter (Signed)
Left a HIPAA compliant voicemail requesting patient to call pharmacy at [facility] clinic regarding lipid results and potential therapy.

## 2019-11-22 ENCOUNTER — Telehealth: Payer: Self-pay | Admitting: Pulmonary Disease

## 2019-11-22 DIAGNOSIS — M8580 Other specified disorders of bone density and structure, unspecified site: Secondary | ICD-10-CM | POA: Diagnosis not present

## 2019-11-22 DIAGNOSIS — Z01419 Encounter for gynecological examination (general) (routine) without abnormal findings: Secondary | ICD-10-CM | POA: Diagnosis not present

## 2019-11-22 MED ORDER — SYMBICORT 160-4.5 MCG/ACT IN AERO: 2.0000 | INHALATION_SPRAY | Freq: Two times a day (BID) | RESPIRATORY_TRACT | 5 refills | Status: DC

## 2019-11-22 MED ORDER — SPIRIVA RESPIMAT 2.5 MCG/ACT IN AERS: 2.0000 | INHALATION_SPRAY | Freq: Every day | RESPIRATORY_TRACT | 5 refills | Status: DC

## 2019-11-22 NOTE — Telephone Encounter (Signed)
We no longer carry samples of Symbicort. Seems like pt has only received sample of spiriva 2.5 and has not had a prescription sent in so pt needs to have Rx sent to pharmacy.  Called and spoke with pt stating this info to her and she verbalized understanding. Pt said to send Rx for both inhalers to local pharmacy for her. This has been done. Also asked pt if she had ever filled out patient assistance for both meds to see if she could be approved and she said that she had not. Stated to pt that we could provide her with these applications and she verbalized understanding. Pt asked to have applications mailed to her. This has been placed in mail for pt. Nothing further needed.

## 2019-11-27 ENCOUNTER — Telehealth (HOSPITAL_COMMUNITY): Payer: Self-pay | Admitting: Emergency Medicine

## 2019-11-27 NOTE — Telephone Encounter (Signed)
Reaching out to patient to offer assistance regarding upcoming cardiac imaging study; pt verbalizes understanding of appt date/time, parking situation and where to check in, pre-test NPO status and medications ordered, and verified current allergies; name and call back number provided for further questions should they arise Joanne Bond RN Navigator Cardiac Imaging [place] [facility] Vascular [phone] office [phone] cell   Pt to take 50mg  metoprolol 2 hr prior to scan

## 2019-11-28 ENCOUNTER — Encounter (HOSPITAL_COMMUNITY): Payer: Self-pay

## 2019-11-28 ENCOUNTER — Ambulatory Visit (HOSPITAL_COMMUNITY)
Admission: RE | Admit: 2019-11-28 | Discharge: 2019-11-28 | Disposition: A | Discharge status: Home / Self Care | Payer: Medicare HMO | Source: Ambulatory Visit | Attending: Cardiovascular Disease | Admitting: Cardiovascular Disease

## 2019-11-28 ENCOUNTER — Telehealth (HOSPITAL_COMMUNITY): Payer: Self-pay | Admitting: Emergency Medicine

## 2019-11-28 DIAGNOSIS — R079 Chest pain, unspecified: Secondary | ICD-10-CM | POA: Insufficient documentation

## 2019-11-28 DIAGNOSIS — Q245 Malformation of coronary vessels: Secondary | ICD-10-CM | POA: Insufficient documentation

## 2019-11-28 DIAGNOSIS — I2510 Atherosclerotic heart disease of native coronary artery without angina pectoris: Secondary | ICD-10-CM

## 2019-11-28 HISTORY — DX: Atherosclerotic heart disease of native coronary artery without angina pectoris: I25.10

## 2019-11-28 MED ORDER — NITROGLYCERIN 0.4 MG SL SUBL
0.8000 mg | SUBLINGUAL_TABLET | Freq: Once | SUBLINGUAL | Status: AC
  Administered 2019-11-28: 0.8 mg via SUBLINGUAL

## 2019-11-28 MED ORDER — IOHEXOL 350 MG/ML SOLN
80.0000 mL | Freq: Once | INTRAVENOUS | Status: AC | PRN
  Administered 2019-11-28: 80 mL via INTRAVENOUS

## 2019-11-28 MED ORDER — NITROGLYCERIN 0.4 MG SL SUBL
SUBLINGUAL_TABLET | SUBLINGUAL | Status: AC
  Filled 2019-11-28: qty 2

## 2019-11-28 NOTE — Telephone Encounter (Signed)
Pt calling to clarify medication instructions for todays CCTA  Pt already took daily bisoprolol and last HR during office visit was 68.   Pt instructed NOT to take metoprolol since she already took her daily beta blocker.   Told her that we will give IV beta blocker on site if needed for rate control.   Pt appreciated the info.  Marchia Bond RN Navigator Cardiac Imaging [facility] Heart and Vascular Services [phone] Office  [phone] Cell

## 2019-11-29 ENCOUNTER — Telehealth: Payer: Self-pay | Admitting: Cardiovascular Disease

## 2019-11-29 NOTE — Telephone Encounter (Signed)
Spoke with Opal Sidles from [facility] Radiology re Coronary CT findings.  Opal Sidles reports:  Incompletely imaged mixed solid and subsolid nodule in the right upper lobe concerning for potential primary bronchogenic neoplasm such as adenocarcinoma. Further evaluation with dedicated noncontrast chest CT is recommended to better evaluate the full extent of this lesion and establish a baseline for future follow-up  Will route information to Dr Acie Fredrickson.  Full report has been posted in Epic.

## 2019-11-29 NOTE — Telephone Encounter (Signed)
Please see result note.    We will refer her to the pulonary nodule clinic / Eric Form, NP

## 2019-11-29 NOTE — Telephone Encounter (Signed)
Joanne Rogers from [facility] Radiology calling with critical CT results.

## 2019-11-30 ENCOUNTER — Telehealth: Payer: Self-pay

## 2019-11-30 DIAGNOSIS — R911 Solitary pulmonary nodule: Secondary | ICD-10-CM

## 2019-11-30 NOTE — Telephone Encounter (Signed)
Spoke with pt and advised per Dr Acie Fredrickson pt's calcium score is 150 which put pt in 60th percentile for mild-moderate CAD.  Pt will need aggressive lipid lowering with goal of LDL less than 70.  Pt states she is unable to take statins but has recently started on a plant based diet and walks daily for exercise.  Discussed finding of pulmonary nodule of right upper lobe of lung.  Pt advised Dr Acie Fredrickson recommends referral to the Lung Nodule clinic with Maggie Schwalbe  Pt verbalizes understanding and agrees with current plan.  Referral placed.

## 2019-11-30 NOTE — Telephone Encounter (Signed)
-----   Message from Thayer Headings, MD sent at 11/29/2019  7:33 PM EST ----- Coronary calcium score: The patient's coronary artery calcium score is 150, which places the patient in the 60 percentile Mild - moderate coronary artery disease.  She will need aggressive lipid lowering.  Goal LDL is < 70  She has a right upper lobe pulmonary nodule.   Please refer her to the Lung nodule clinic.   Eric Form, NP)

## 2019-12-05 ENCOUNTER — Telehealth: Payer: Self-pay | Admitting: Pharmacist

## 2019-12-05 DIAGNOSIS — E7800 Pure hypercholesterolemia, unspecified: Secondary | ICD-10-CM

## 2019-12-05 NOTE — Telephone Encounter (Signed)
Spoke to patient regarding lipid management. Discussed starting a PCSK9-inhibitor for lipid-lowering with an LDL goal<70 due to CAD w/calcium score of 150 (60th percentile), however patient expressed she is under a lot of stress with new CT results and cannot make a decision at the moment. Patient agreed to be contacted in two weeks to reassess starting lipid-lowering options.

## 2019-12-22 DIAGNOSIS — H4720 Unspecified optic atrophy: Secondary | ICD-10-CM | POA: Diagnosis not present

## 2019-12-22 DIAGNOSIS — H2513 Age-related nuclear cataract, bilateral: Secondary | ICD-10-CM | POA: Diagnosis not present

## 2019-12-26 ENCOUNTER — Telehealth: Payer: Self-pay | Admitting: Pharmacist

## 2019-12-26 DIAGNOSIS — R002 Palpitations: Secondary | ICD-10-CM

## 2019-12-26 MED ORDER — PROPRANOLOL HCL 80 MG PO TABS: 40.0000 mg | ORAL_TABLET | Freq: Every day | ORAL | 3 refills | Status: DC

## 2019-12-26 NOTE — Telephone Encounter (Signed)
Informed patient to discontinue bisoprolol and start propranolol 80 mg - 0.5 tablet daily. Patient verbalized understanding.

## 2019-12-26 NOTE — Addendum Note (Signed)
Addended by: Lorel Monaco A on: 12/26/2019 04:53 PM   Modules accepted: Orders

## 2019-12-26 NOTE — Telephone Encounter (Signed)
I'm fine with switching back to her previous dose of Propranolol 80 mg - 1/2 tablet a day .  I suspect this was the slow release form of propranolol

## 2019-12-26 NOTE — Telephone Encounter (Signed)
Contacted patient regarding hyperlipidemia management, and after multiple discussions about the clinical benefits of lowering LDL to a target goal <70 mg/dL with history of CAD, patient declined starting therapy.   Additionally, patient requested to be switched from bisoprolol back to propanolol. Patient reports that propanolol was effective in improving nervous shakes/tremors and bisoprolol has not helped. Per documentation on 12/13/2009, propanolol was changed to bisoprolol due to improvement in SOB and no exacerbation of palpitations. However, patient reports that SOB was related to COPD and wants to be switched back to propanolol. Confirmed with patient previous dose was propanolol 80 mg - 1/2 tablet daily.  Explained to patient I will forward conversation to Dr. Acie Fredrickson to determine appropriate medication changes. Patient verbalized understanding.

## 2019-12-27 ENCOUNTER — Other Ambulatory Visit: Payer: Self-pay

## 2019-12-27 ENCOUNTER — Ambulatory Visit: Payer: Medicare HMO | Admitting: Emergency Medicine

## 2019-12-27 ENCOUNTER — Encounter: Payer: Self-pay | Admitting: Emergency Medicine

## 2019-12-27 DIAGNOSIS — R911 Solitary pulmonary nodule: Secondary | ICD-10-CM | POA: Diagnosis not present

## 2019-12-27 DIAGNOSIS — J449 Chronic obstructive pulmonary disease, unspecified: Secondary | ICD-10-CM | POA: Diagnosis not present

## 2019-12-27 MED ORDER — SPIRIVA RESPIMAT 2.5 MCG/ACT IN AERS: 2.0000 | INHALATION_SPRAY | Freq: Every day | RESPIRATORY_TRACT | 0 refills | Status: DC

## 2019-12-27 NOTE — Assessment & Plan Note (Signed)
Continue Spiriva, Symbicort, albuterol as needed She is alpha-1 antitrypsin MZ phenotype with normal level.  Plan to check her level intermittently

## 2019-12-27 NOTE — Progress Notes (Signed)
Subjective:    Patient ID: Joanne Rogers, female    DOB: [birth year], 74 y.o.   MRN: [id number]  HPI 74 year old former smoker (~20 pack years) with a history of COPD, degenerative disc disease, allergic rhinitis, hyperlipidemia.  She has alpha-1 antitrypsin MZ phenotype.  Uses nocturnal oxygen.  She has been seen in our office before by Dr. Melvyn Novas, Dr. Elsworth Soho.  Has been managed on Symbicort and Spiriva for COPD.  Most recent pulmonary function testing 07/2015 showed an FEV1 of 1.18 L (49% predicted).  She is referred today by Dr. Acie Fredrickson for evaluation of a mixed density right upper lobe pulmonary nodule that was seen on coronary CT chest 11/28/2019. She is feeling well.   CT chest 11/28/2019 reviewed by me, shows an incompletely imaged mixed solid and subsolid right upper lobe nodule.    Review of Systems As per HPI  Past Medical History:  Diagnosis Date   Arthritis    back, hands, neck   COPD (chronic obstructive pulmonary disease) (HCC)    DDD (degenerative disc disease), lumbar    Dyspnea    H/O bronchitis    allergy related   Hypercholesterolemia    Palpitations    "fluttering"   Pneumonia    hx of     Family History  Problem Relation Age of Onset   Breast cancer Mother 40   Emphysema Maternal Grandfather      Social History   Socioeconomic History   Marital status: Widowed    Spouse name: Not on file   Number of children: Not on file   Years of education: Not on file   Highest education level: Not on file  Occupational History   Occupation: caregiver  Tobacco Use   Smoking status: Former Smoker    Packs/day: 0.50    Years: 25.00    Pack years: 12.50    Types: Cigarettes    Quit date: 05/16/2005    Years since quitting: 14.6   Smokeless tobacco: Never Used  Vaping Use   Vaping Use: Never used  Substance and Sexual Activity   Alcohol use: Yes    Alcohol/week: 6.0 standard drinks    Types: 6 Standard drinks or equivalent per week     Comment: social   Drug use: No   Sexual activity: Not on file  Other Topics Concern   Not on file  Social History Narrative   Children   Lives alone   Denies caffeine use.   Social Determinants of Health   Financial Resource Strain: Not on file  Food Insecurity: Not on file  Transportation Needs: Not on file  Physical Activity: Not on file  Stress: Not on file  Social Connections: Not on file  Intimate Partner Violence: Not on file     Allergies  Allergen Reactions   Amoxicillin Nausea And Vomiting   Codeine     REACTION: hives   Crestor [Rosuvastatin Calcium]     Ache, depression.    Levofloxacin Nausea Only   Lovastatin Other (See Comments)    Ache, depression.    Simvastatin Other (See Comments)    Ache, depression.    Statins Other (See Comments)    Muscle aches   Latex Hives and Rash     Outpatient Medications Prior to Visit  Medication Sig Dispense Refill   albuterol (VENTOLIN HFA) 108 (90 Base) MCG/ACT inhaler Inhale 1-2 puffs into the lungs every 4 (four) hours as needed for wheezing or shortness of breath. 1 Inhaler 5  Ascorbic Acid (VITAMIN C PO) Take 1 tablet by mouth daily.     aspirin 81 MG tablet Take 81 mg by mouth at bedtime.     azelastine (ASTELIN) 0.1 % nasal spray Place 1 spray into both nostrils as needed.     beta carotene w/minerals (OCUVITE) tablet Take 1 tablet by mouth at bedtime.     cetirizine (ZYRTEC) 10 MG tablet Take 10 mg by mouth at bedtime.     famotidine (PEPCID) 20 MG tablet One at bedtime 30 tablet 11   ipratropium-albuterol (DUONEB) 0.5-2.5 (3) MG/3ML SOLN Take 3 mLs by nebulization every 4 (four) hours. Dx: J44.9 360 mL 0   Multiple Minerals-Vitamins (CALCIUM-MAGNESIUM-ZINC-D3) TABS Take 3 tablets by mouth daily.     OXYGEN 2lpm with sleep     propranolol (INDERAL) 80 MG tablet Take 0.5 tablets (40 mg total) by mouth daily. 45 tablet 3   Respiratory Therapy Supplies (FLUTTER) DEVI 1 Device by Does not  apply route as needed. 1 each 0   SYMBICORT 160-4.5 MCG/ACT inhaler Inhale 2 puffs into the lungs in the morning and at bedtime. 10.2 g 5   Tiotropium Bromide Monohydrate (SPIRIVA RESPIMAT) 2.5 MCG/ACT AERS Inhale 2 puffs into the lungs daily. 4 g 5   valACYclovir (VALTREX) 1000 MG tablet Take 500 mg by mouth every other day.      pantoprazole (PROTONIX) 40 MG tablet Take 1 tablet (40 mg total) by mouth daily. Take 30-60 min before first meal of the day (Patient not taking: Reported on 12/27/2019) 30 tablet 2   No facility-administered medications prior to visit.         Objective:   Physical Exam Vitals:   12/27/19 1102  BP: 122/74  Pulse: 72  Temp: (!) 97.2 F (36.2 C)  SpO2: 98%  Weight: 154 lb 6.4 oz (70 kg)  Height: 5\' 6"  (1.676 m)   Gen: Pleasant, well-nourished, in no distress,  normal affect  ENT: No lesions,  mouth clear,  oropharynx clear, no postnasal drip  Neck: No JVD, no stridor  Lungs: No use of accessory muscles, no crackles or wheezing on normal respiration, no wheeze on forced expiration  Cardiovascular: RRR, heart sounds normal, no murmur or gallops, no peripheral edem  Musculoskeletal: No deformities, no cyanosis or clubbing  Neuro: alert, awake, non focal  Skin: Warm, no lesions or rash      Assessment & Plan:  Pulmonary nodule Right upper lobe mixed density pulmonary nodule seen on her cardiac CT in November.  This looks suspicious for an adenocarcinoma.  I will perform a super D CT of her chest now to characterize risk for parenchyma.  If the existing nodule is stable then we will probably follow with a repeat film in 3 months to help assess risk.  If our suspicion increases then I think navigational bronchoscopy would be appropriate.  Her COPD, FEV1 make her a poor candidate for primary resection.  We will plan to repeat your CT scan of the chest now.  We work on setting this up. We will plan to follow your pulmonary nodule with serial CT  scans to assess for stability.  If there is a significant change then we will talk about strategies to evaluate further. Please continue your Spiriva, Symbicort, albuterol as you have been using them. Follow with Dr Lamonte Sakai in 3 months or sooner if you have any problems.  COPD GOLD III Continue Spiriva, Symbicort, albuterol as needed She is alpha-1 antitrypsin MZ phenotype with normal  level.  Plan to check her level intermittently  Baltazar Apo, MD, PhD 12/27/2019, 11:48 AM [facility] [phone] or if no answer [phone]

## 2019-12-27 NOTE — Addendum Note (Signed)
Addended by: Gavin Potters R on: 12/27/2019 12:01 PM   Modules accepted: Orders

## 2019-12-27 NOTE — Assessment & Plan Note (Signed)
Right upper lobe mixed density pulmonary nodule seen on her cardiac CT in November.  This looks suspicious for an adenocarcinoma.  I will perform a super D CT of her chest now to characterize risk for parenchyma.  If the existing nodule is stable then we will probably follow with a repeat film in 3 months to help assess risk.  If our suspicion increases then I think navigational bronchoscopy would be appropriate.  Her COPD, FEV1 make her a poor candidate for primary resection.  We will plan to repeat your CT scan of the chest now.  We work on setting this up. We will plan to follow your pulmonary nodule with serial CT scans to assess for stability.  If there is a significant change then we will talk about strategies to evaluate further. Please continue your Spiriva, Symbicort, albuterol as you have been using them. Follow with Dr Lamonte Sakai in 3 months or sooner if you have any problems.

## 2019-12-27 NOTE — Patient Instructions (Signed)
We will plan to repeat your CT scan of the chest now.  We work on setting this up. We will plan to follow your pulmonary nodule with serial CT scans to assess for stability.  If there is a significant change then we will talk about strategies to evaluate further. Please continue your Spiriva, Symbicort, albuterol as you have been using them. Follow with Dr Lamonte Sakai in 3 months or sooner if you have any problems.

## 2020-01-01 ENCOUNTER — Ambulatory Visit (HOSPITAL_BASED_OUTPATIENT_CLINIC_OR_DEPARTMENT_OTHER)
Admission: RE | Admit: 2020-01-01 | Discharge: 2020-01-01 | Disposition: A | Discharge status: Home / Self Care | Payer: Medicare HMO | Source: Ambulatory Visit | Attending: Emergency Medicine | Admitting: Emergency Medicine

## 2020-01-01 ENCOUNTER — Other Ambulatory Visit: Payer: Self-pay

## 2020-01-01 DIAGNOSIS — R911 Solitary pulmonary nodule: Secondary | ICD-10-CM

## 2020-01-01 DIAGNOSIS — R918 Other nonspecific abnormal finding of lung field: Secondary | ICD-10-CM | POA: Diagnosis not present

## 2020-01-01 DIAGNOSIS — I2510 Atherosclerotic heart disease of native coronary artery without angina pectoris: Secondary | ICD-10-CM | POA: Diagnosis not present

## 2020-01-01 DIAGNOSIS — I700 Atherosclerosis of aorta: Secondary | ICD-10-CM | POA: Diagnosis not present

## 2020-01-02 ENCOUNTER — Telehealth: Payer: Self-pay | Admitting: Emergency Medicine

## 2020-01-02 DIAGNOSIS — R911 Solitary pulmonary nodule: Secondary | ICD-10-CM

## 2020-01-02 NOTE — Telephone Encounter (Signed)
I reviewed with the patient  Need to set up SuperD CT for June 2022 please and then OV w me after

## 2020-01-02 NOTE — Telephone Encounter (Signed)
Spoke with the pt  She states Dr Lamonte Sakai told her to call and remind him when she has CT Chest done  CT was done 01/01/20  Please advise on results

## 2020-01-02 NOTE — Telephone Encounter (Signed)
Called and spoke with pt letting her know that we will place order for repeat Super D CT in June and also reminder for f/u with RB after. Pt verbalized understanding. Reminders have been placed. Nothing further needed.

## 2020-01-23 DIAGNOSIS — N9089 Other specified noninflammatory disorders of vulva and perineum: Secondary | ICD-10-CM | POA: Diagnosis not present

## 2020-01-30 DIAGNOSIS — H25811 Combined forms of age-related cataract, right eye: Secondary | ICD-10-CM | POA: Diagnosis not present

## 2020-01-30 DIAGNOSIS — H2511 Age-related nuclear cataract, right eye: Secondary | ICD-10-CM | POA: Diagnosis not present

## 2020-02-13 DIAGNOSIS — H2512 Age-related nuclear cataract, left eye: Secondary | ICD-10-CM | POA: Diagnosis not present

## 2020-02-13 DIAGNOSIS — H25812 Combined forms of age-related cataract, left eye: Secondary | ICD-10-CM | POA: Diagnosis not present

## 2020-02-19 ENCOUNTER — Telehealth: Payer: Self-pay | Admitting: Emergency Medicine

## 2020-02-19 DIAGNOSIS — D1801 Hemangioma of skin and subcutaneous tissue: Secondary | ICD-10-CM | POA: Diagnosis not present

## 2020-02-19 DIAGNOSIS — L821 Other seborrheic keratosis: Secondary | ICD-10-CM | POA: Diagnosis not present

## 2020-02-19 DIAGNOSIS — D2261 Melanocytic nevi of right upper limb, including shoulder: Secondary | ICD-10-CM | POA: Diagnosis not present

## 2020-02-19 DIAGNOSIS — D171 Benign lipomatous neoplasm of skin and subcutaneous tissue of trunk: Secondary | ICD-10-CM | POA: Diagnosis not present

## 2020-02-19 DIAGNOSIS — L720 Epidermal cyst: Secondary | ICD-10-CM | POA: Diagnosis not present

## 2020-02-19 DIAGNOSIS — D225 Melanocytic nevi of trunk: Secondary | ICD-10-CM | POA: Diagnosis not present

## 2020-02-19 DIAGNOSIS — Z85828 Personal history of other malignant neoplasm of skin: Secondary | ICD-10-CM | POA: Diagnosis not present

## 2020-02-19 DIAGNOSIS — D2262 Melanocytic nevi of left upper limb, including shoulder: Secondary | ICD-10-CM | POA: Diagnosis not present

## 2020-02-19 DIAGNOSIS — L820 Inflamed seborrheic keratosis: Secondary | ICD-10-CM | POA: Diagnosis not present

## 2020-02-19 MED ORDER — SPIRIVA RESPIMAT 2.5 MCG/ACT IN AERS: 2.0000 | INHALATION_SPRAY | Freq: Every day | RESPIRATORY_TRACT | 0 refills | Status: DC

## 2020-02-19 NOTE — Telephone Encounter (Signed)
Called and spoke with patient to let her know that our office does not get samples of Symbicort but that we did have some samples of Spiriva. She states that they have been ordered from [facility] mail order but they said it would take at least 10 days and she will be out by then. Advised patient that I would put sample upfront for her. Asked her if she ever did the patient assistance paperwork for the inhalers that [facility] mailed her. She states that she did not and that she has done them before and was denied, then stated that her income has changed since she last did it. Asked patient to fill out paperwork and bring it in with her when she comes to pick and samples and we can submit it for her and see what they say. She expressed understanding and said that she would. Nothing further needed at this time.

## 2020-03-14 ENCOUNTER — Other Ambulatory Visit: Payer: Self-pay | Admitting: Cardiovascular Disease

## 2020-03-14 ENCOUNTER — Telehealth: Payer: Self-pay | Admitting: Cardiovascular Disease

## 2020-03-14 DIAGNOSIS — H524 Presbyopia: Secondary | ICD-10-CM | POA: Diagnosis not present

## 2020-03-14 DIAGNOSIS — H5203 Hypermetropia, bilateral: Secondary | ICD-10-CM | POA: Diagnosis not present

## 2020-03-14 DIAGNOSIS — H52209 Unspecified astigmatism, unspecified eye: Secondary | ICD-10-CM | POA: Diagnosis not present

## 2020-03-14 MED ORDER — BISOPROLOL FUMARATE 5 MG PO TABS: 5.0000 mg | ORAL_TABLET | Freq: Every day | ORAL | 3 refills | Status: DC

## 2020-03-14 NOTE — Telephone Encounter (Signed)
Pt aware and new script sent via epic to [facility]

## 2020-03-14 NOTE — Telephone Encounter (Signed)
Pt c/o medication issue:  1. Name of Medication:  Propranolol Bisoprolol  2. How are you currently taking this medication (dosage and times per day)? na  3. Are you having a reaction (difficulty breathing--STAT)? Na   4. What is your medication issue? Pt called in and stated that she can not take the Propranolol .  The last time she took this med she had the shakes , nervous  And anxiety.  She would like to go back to the Bisoprolol. She would like a refill of the Bisoprolol called in to [facility] mail order.  .  Best number [phone]

## 2020-03-14 NOTE — Telephone Encounter (Signed)
Spoke with pt and would prefer to restart  the Bisoprolol 5 mg  Per pt Propanolol makes her feel "jittery" Will forward to Dr Acie Fredrickson for review .Adonis Housekeeper

## 2020-03-14 NOTE — Telephone Encounter (Signed)
She may resume bisoprolol 5 mg po QD

## 2020-03-28 DIAGNOSIS — H0279 Other degenerative disorders of eyelid and periocular area: Secondary | ICD-10-CM | POA: Diagnosis not present

## 2020-03-28 DIAGNOSIS — H02411 Mechanical ptosis of right eyelid: Secondary | ICD-10-CM | POA: Diagnosis not present

## 2020-03-28 DIAGNOSIS — I4891 Unspecified atrial fibrillation: Secondary | ICD-10-CM | POA: Insufficient documentation

## 2020-03-28 DIAGNOSIS — H53483 Generalized contraction of visual field, bilateral: Secondary | ICD-10-CM | POA: Diagnosis not present

## 2020-03-28 DIAGNOSIS — H02021 Mechanical entropion of right upper eyelid: Secondary | ICD-10-CM | POA: Diagnosis not present

## 2020-03-28 DIAGNOSIS — H02831 Dermatochalasis of right upper eyelid: Secondary | ICD-10-CM | POA: Diagnosis not present

## 2020-03-28 DIAGNOSIS — H02834 Dermatochalasis of left upper eyelid: Secondary | ICD-10-CM | POA: Diagnosis not present

## 2020-03-28 DIAGNOSIS — H57811 Brow ptosis, right: Secondary | ICD-10-CM | POA: Diagnosis not present

## 2020-04-15 DIAGNOSIS — H53483 Generalized contraction of visual field, bilateral: Secondary | ICD-10-CM | POA: Diagnosis not present

## 2020-04-15 DIAGNOSIS — H53482 Generalized contraction of visual field, left eye: Secondary | ICD-10-CM | POA: Diagnosis not present

## 2020-04-15 DIAGNOSIS — H53481 Generalized contraction of visual field, right eye: Secondary | ICD-10-CM | POA: Diagnosis not present

## 2020-04-15 DIAGNOSIS — H4600 Optic papillitis, unspecified eye: Secondary | ICD-10-CM | POA: Insufficient documentation

## 2020-05-07 ENCOUNTER — Ambulatory Visit: Payer: Medicare HMO | Admitting: Cardiovascular Disease

## 2020-05-07 ENCOUNTER — Other Ambulatory Visit: Payer: Self-pay

## 2020-05-07 ENCOUNTER — Encounter: Payer: Self-pay | Admitting: Cardiovascular Disease

## 2020-05-07 ENCOUNTER — Telehealth: Payer: Self-pay | Admitting: Pharmacist

## 2020-05-07 VITALS — BP 110/58 | HR 76 | Ht 66.0 in | Wt 153.0 lb

## 2020-05-07 DIAGNOSIS — I5032 Chronic diastolic (congestive) heart failure: Secondary | ICD-10-CM

## 2020-05-07 DIAGNOSIS — I2510 Atherosclerotic heart disease of native coronary artery without angina pectoris: Secondary | ICD-10-CM

## 2020-05-07 DIAGNOSIS — E782 Mixed hyperlipidemia: Secondary | ICD-10-CM

## 2020-05-07 NOTE — Progress Notes (Signed)
Joanne Rogers Joanne Rogers Date of Birth  [birth year] [facility] [address]    [place], [place]  [address] [phone]  Fax  [phone]  Problem List 1. Hyperlipidemia 2. Chronic lung disease,   Asthma  3. Chronic diastolic CHF 4.       Joanne Rogers is a 75 y.o. female with a hx of dyspnea.  She had a normal myoview in 2007.  She has a hx of hyperliidemia.   She had recent lipids but they are not available at this time.  She has been diagnosed with COPD.  She denies any chest pain.  She had some chest wall / breast pain during there last mamogram on July.  She is exercising 2-3 times a week.  No CP or dyspnea.  Dec. 2, 1308:   Joanne Rogers is doing well.  Riding bike and exercising regularly.   Looking for a new job.  No CP or palps  Dec. 3, 6578: Joanne Rogers is doing ok.  No CP,  She has chronic shortness of breath. Has bad allergies. She has normal LV function by echo in 2007.    Dec. 6, 2016:  Doing ok from cardiac standpoint.  Has gained some weight.  Exercising regularly  breathin is ok  Dec. 7, 4696:  Joanne Rogers is seen today for follow up of her shortness of breath. She has normal left ventricle systolic function by echo. She was found have grade 1 diastolic dysfunction. Just getting over bronchitis - is still short of breath .   March 30, 2950:   Joanne Rogers is seen today for follow up of her chronic diastolic congestive heart failure. Echocardiogram from December, 2015 reveals normal left ventricular systolic function.  She has grade 1 diastolic dysfunction.  She fell 5 weeks ago and broke her right fibula.    Is wearing a small brace now   she has lost some weight  May 03, 8411:  Joanne Rogers is seen today for follow up of her diastolic CHF.  Is active.  walks 5000 steps a day . Her neighborhood has lots of feel so she is fairly tired when she gets through with her walk. No syncope or  CP .   Just gets fatigued.  Was very short of breath walking from the back of the office to  the exam room  Also has COPD.    I reviewed her PFTs .   Has significnant reduction of  FEV1 of 1.01  FEF 25-75 is markedly reduced.   Her last echocardiogram was 2015.  She had normal left ventricular systolic function at that time and on bad mild diastolic dysfunction.  Her symptoms seem to be out of proportion relative to her degree of mild diastolic congestive heart failure.  Oct. 27, 2440: Joanne Rogers is seen today for follow up of her diastolic CHF, severe COPD, severely reduced diffusion capacity. Had some chest pain in Aug.   Started during her everyday activity.  "Elephant on her chest " Lasted for many hours  Went to the ER.   Troponin levels   were normal .  Was told to follow up with [place] . Albuterol may have helped some  Last stress myoivew was 2007.    Aprl 26, 1027:  Joanne Rogers is seen for follow up of her COPD She had some chest pain at her last visit Coronary CTA revealed RUL lung mass -   Has seen pulmonary ( [place])  Repeat CT scan in 6 months Coronary calcium score is 150 which places  her in the 60th percentile. RCA-mild calcified stenosis Left main stenosis normal LAD moderate stenosis in the proximal/mid LAD. LCx - mild CAD   Has lost 23 lbs since last appt. Wt today is 153 lbs.  Is walking more  Has significant muscle aches with statins  Has seen lipid clinic ,  Does not want to take repatha or pralulent   Current Outpatient Medications on File Prior to Visit  Medication Sig Dispense Refill  . albuterol (VENTOLIN HFA) 108 (90 Base) MCG/ACT inhaler Inhale 1-2 puffs into the lungs every 4 (four) hours as needed for wheezing or shortness of breath. 1 Inhaler 5  . Ascorbic Acid (VITAMIN C PO) Take 1 tablet by mouth daily.    Marland Kitchen aspirin 81 MG tablet Take 81 mg by mouth at bedtime.    Marland Kitchen azelastine (ASTELIN) 0.1 % nasal spray Place 1 spray into both nostrils as needed.    . beta carotene w/minerals (OCUVITE) tablet Take 1 tablet by mouth at bedtime.    . bisoprolol  (ZEBETA) 5 MG tablet Take 1 tablet (5 mg total) by mouth daily. 90 tablet 3  . cetirizine (ZYRTEC) 10 MG tablet Take 10 mg by mouth at bedtime.    . famotidine (PEPCID) 20 MG tablet One at bedtime 30 tablet 11  . ipratropium-albuterol (DUONEB) 0.5-2.5 (3) MG/3ML SOLN Take 3 mLs by nebulization every 4 (four) hours. Dx: J44.9 360 mL 0  . Multiple Minerals-Vitamins (CALCIUM-MAGNESIUM-ZINC-D3) TABS Take 3 tablets by mouth daily.    . OXYGEN 2lpm with sleep    . Respiratory Therapy Supplies (FLUTTER) DEVI 1 Device by Does not apply route as needed. 1 each 0  . SYMBICORT 160-4.5 MCG/ACT inhaler Inhale 2 puffs into the lungs in the morning and at bedtime. 10.2 g 5  . Tiotropium Bromide Monohydrate (SPIRIVA RESPIMAT) 2.5 MCG/ACT AERS Inhale 2 puffs into the lungs daily. 4 g 0  . valACYclovir (VALTREX) 1000 MG tablet Take 500 mg by mouth every other day.      No current facility-administered medications on file prior to visit.    Allergies  Allergen Reactions  . Amoxicillin Nausea And Vomiting  . Codeine     REACTION: hives  . Crestor [Rosuvastatin Calcium]     Ache, depression.   . Levofloxacin Nausea Only  . Lovastatin Other (See Comments)    Ache, depression.   . Simvastatin Other (See Comments)    Ache, depression.   . Statins Other (See Comments)    Muscle aches  . Latex Hives and Rash    Past Medical History:  Diagnosis Date  . Arthritis    back, hands, neck  . COPD (chronic obstructive pulmonary disease) ([place])   . DDD (degenerative disc disease), lumbar   . Dyspnea   . H/O bronchitis    allergy related  . Hypercholesterolemia   . Palpitations    "fluttering"  . Pneumonia    hx of    Past Surgical History:  Procedure Laterality Date  . CARDIOVASCULAR STRESS TEST  11/16/2005   EF 70%, NO ISCHEMIA  . CARPAL TUNNEL RELEASE Right ~1988  . DILATION AND CURETTAGE OF UTERUS     x3: x2 for miscarrages and x1 for polyp  . HEMI-MICRODISCECTOMY LUMBAR LAMINECTOMY LEVEL 1 Left  07/20/2014   Procedure: centeral decompression,L4-L5, HEMI-LAMINECTOMY MICRODISCECTOMY L4 - L5 ON THE LEFT  LEVEL 1, forimatomy 4-5 root on the left;  Surgeon: Latanya Maudlin, MD;  Location: [facility];  Service: Orthopedics;  Laterality: Left;  .  US ECHOCARDIOGRAPHY  11/19/2005   EF 55-60%    Social History   Tobacco Use  Smoking Status Former Smoker  . Packs/day: 0.50  . Years: 25.00  . Pack years: 12.50  . Types: Cigarettes  . Quit date: 05/16/2005  . Years since quitting: 14.9  Smokeless Tobacco Never Used    Social History   Substance and Sexual Activity  Alcohol Use Yes  . Alcohol/week: 6.0 standard drinks  . Types: 6 Standard drinks or equivalent per week   Comment: social    Family History  Problem Relation Age of Onset  . Breast cancer Mother 59  . Emphysema Maternal Grandfather     Reviw of Systems:  Reviewed in the HPI.  All other systems are negative.  Physical Exam: Blood pressure (!) 110/58, pulse 76, height 5\' 6"  (1.676 m), weight 153 lb (69.4 kg), SpO2 91 %.  GEN:  Well nourished, well developed in no acute distress HEENT: Normal NECK: No JVD; No carotid bruits LYMPHATICS: No lymphadenopathy CARDIAC: RRR , no murmurs, rubs, gallops RESPIRATORY:   Basilar rales - ? Chronic pulmonary fibrosis  ABDOMEN: Soft, non-tender, non-distended MUSCULOSKELETAL:  No edema; No deformity  SKIN: Warm and dry NEUROLOGIC:  Alert and oriented x 3    ECG:    Assessment / Plan:   1.  CAD :   She has mild to moderate coronary artery disease by coronary CT angiogram.  She does not have any critical stenosis.  I recommended medical therapy.  Her LDL remains very high.  She is intolerant of all statins.  We have once again discussed starting a PCSK9 inhibitor or inclisiran .  We will have her return to the lipid clinic to discuss her options.  I will send them a note.:    .   2. Hyperlipidemia:      Will have our lipid clinic  reach out to her to discuss option    3.  Bronchitis / COPD -  has basilar rales,  Does not appear to be volume overloaded.  Will see if there is a pulmonary explanation for this       Mertie Moores, MD  05/07/2020 11:28 AM    [facility] [address],  [place], [place]  [address] Pager [phone] Phone: [phone]; Fax: [phone]

## 2020-05-07 NOTE — Telephone Encounter (Signed)
Received a message from Dr. Acie Fredrickson requesting pharmacy to discuss lipid lowering options including PCSK9-inhibitors or inclisiran as patient has multiple statin-intolerances.   Previously discussed with patient in December 2021 the clinical benefits of PCSK9-inhibitors to achieve an LDL goal <70 mg/dL, however patient declined therapy.   Today, Dr. Acie Fredrickson discussed with the patient the need for a lipid-lowering medication due to hx of mild to moderate CAD and referred to lipid clinic to discuss options.  Called patient today, however did not answer. Left a voicemail requesting a call back. Will call again in 1 week.

## 2020-05-07 NOTE — Patient Instructions (Signed)
Medication Instructions:  Your physician recommends that you continue on your current medications as directed. Please refer to the Current Medication list given to you today.  *If you need a refill on your cardiac medications before your next appointment, please call your pharmacy*   Lab Work: 05/08/2020- Lipids, alt, bmet   Testing/Procedures: none   Follow-Up: At [facility], you and your health needs are our priority.  As part of our continuing mission to provide you with exceptional heart care, we have created designated Provider Care Teams.  These Care Teams include your primary Cardiologist (physician) and Advanced Practice Providers (APPs -  Physician Assistants and Nurse Practitioners) who all work together to provide you with the care you need, when you need it.   Your next appointment:   1 year(s)  The format for your next appointment:   In Person  Provider:   You may see Mertie Moores, MD or one of the following Advanced Practice Providers on your designated Care Team:    Richardson Dopp, PA-C  Robbie Lis, [place]    Other Instructions You have been referred to see a pharmacist in the lipid clinic.

## 2020-05-08 ENCOUNTER — Other Ambulatory Visit: Payer: Medicare HMO | Admitting: *Deleted

## 2020-05-08 DIAGNOSIS — I2510 Atherosclerotic heart disease of native coronary artery without angina pectoris: Secondary | ICD-10-CM

## 2020-05-08 LAB — LIPID PANEL
Chol/HDL Ratio: 3 ratio (ref 0.0–4.4)
Cholesterol, Total: 234 mg/dL — ABNORMAL HIGH (ref 100–199)
HDL: 77 mg/dL (ref >39)
LDL Chol Calc (NIH): 141 mg/dL — ABNORMAL HIGH (ref 0–99)
Triglycerides: 95 mg/dL (ref 0–149)
VLDL Cholesterol Cal: 16 mg/dL (ref 5–40)

## 2020-05-08 LAB — BASIC METABOLIC PANEL
BUN/Creatinine Ratio: 12 (ref 12–28)
BUN: 8 mg/dL (ref 8–27)
CO2: 28 mmol/L (ref 20–29)
Calcium: 10 mg/dL (ref 8.7–10.3)
Chloride: 96 mmol/L (ref 96–106)
Creatinine, Ser: 0.66 mg/dL (ref 0.57–1.00)
Glucose: 93 mg/dL (ref 65–99)
Potassium: 4.1 mmol/L (ref 3.5–5.2)
Sodium: 137 mmol/L (ref 134–144)
eGFR: 91 mL/min/{1.73_m2} (ref >59)

## 2020-05-08 LAB — ALT: ALT: 19 IU/L (ref 0–32)

## 2020-05-14 ENCOUNTER — Telehealth: Payer: Self-pay | Admitting: Pharmacist

## 2020-05-14 DIAGNOSIS — I2510 Atherosclerotic heart disease of native coronary artery without angina pectoris: Secondary | ICD-10-CM

## 2020-05-14 DIAGNOSIS — E782 Mixed hyperlipidemia: Secondary | ICD-10-CM

## 2020-05-14 NOTE — Telephone Encounter (Signed)
Contacted patient regarding hyperlipidemia management, however pt did not answer for the second time. Left a HIPAA compliant voicemail requesting a call back. Will call again in 2 weeks.

## 2020-05-28 ENCOUNTER — Telehealth: Payer: Self-pay | Admitting: Pharmacist

## 2020-05-28 NOTE — Telephone Encounter (Signed)
Contacted patient regarding hyperlipidemia management, however pt did not answer for the third time. Left a HIPAA compliant voicemail requesting a call back. Since this is the third attempt, will await for patient to return call.

## 2020-06-12 ENCOUNTER — Other Ambulatory Visit (HOSPITAL_BASED_OUTPATIENT_CLINIC_OR_DEPARTMENT_OTHER): Payer: Medicare HMO

## 2020-06-17 ENCOUNTER — Ambulatory Visit (HOSPITAL_BASED_OUTPATIENT_CLINIC_OR_DEPARTMENT_OTHER)
Admission: RE | Admit: 2020-06-17 | Discharge: 2020-06-17 | Disposition: A | Discharge status: Home / Self Care | Payer: Medicare HMO | Source: Ambulatory Visit | Attending: Emergency Medicine | Admitting: Emergency Medicine

## 2020-06-17 ENCOUNTER — Other Ambulatory Visit: Payer: Self-pay

## 2020-06-17 DIAGNOSIS — I2510 Atherosclerotic heart disease of native coronary artery without angina pectoris: Secondary | ICD-10-CM | POA: Diagnosis not present

## 2020-06-17 DIAGNOSIS — J984 Other disorders of lung: Secondary | ICD-10-CM | POA: Diagnosis not present

## 2020-06-17 DIAGNOSIS — R911 Solitary pulmonary nodule: Secondary | ICD-10-CM | POA: Diagnosis not present

## 2020-06-17 DIAGNOSIS — R918 Other nonspecific abnormal finding of lung field: Secondary | ICD-10-CM | POA: Diagnosis not present

## 2020-06-17 DIAGNOSIS — I700 Atherosclerosis of aorta: Secondary | ICD-10-CM | POA: Diagnosis not present

## 2020-06-19 ENCOUNTER — Encounter: Payer: Self-pay | Admitting: Emergency Medicine

## 2020-06-19 ENCOUNTER — Other Ambulatory Visit: Payer: Self-pay

## 2020-06-19 ENCOUNTER — Ambulatory Visit (INDEPENDENT_AMBULATORY_CARE_PROVIDER_SITE_OTHER): Payer: Medicare HMO | Admitting: Emergency Medicine

## 2020-06-19 VITALS — BP 104/68 | HR 76 | Temp 98.4°F | Ht 66.0 in | Wt 152.4 lb

## 2020-06-19 DIAGNOSIS — R911 Solitary pulmonary nodule: Secondary | ICD-10-CM | POA: Diagnosis not present

## 2020-06-19 DIAGNOSIS — J449 Chronic obstructive pulmonary disease, unspecified: Secondary | ICD-10-CM

## 2020-06-19 MED ORDER — SPIRIVA RESPIMAT 2.5 MCG/ACT IN AERS: 2.0000 | INHALATION_SPRAY | Freq: Every day | RESPIRATORY_TRACT | 0 refills | Status: DC

## 2020-06-19 NOTE — Assessment & Plan Note (Signed)
She had a recent flare but it resolved without prednisone or antibiotics.  She is on Symbicort and Spiriva.  Plan to continue same

## 2020-06-19 NOTE — Patient Instructions (Addendum)
We will plan to repeat your CT scan of the chest in June 2023. Please continue your Spiriva and Symbicort as you have been taking them. Keep albuterol available to use 2 puffs if needed short of breath, chest tightness, wheezing. Follow with Dr Lamonte Sakai in 6 months or sooner if you have any problems

## 2020-06-19 NOTE — Progress Notes (Signed)
   Subjective:    Patient ID: Joanne Rogers, female    DOB: 1945-09-23, 75 y.o.   MRN: [id number]  HPI 75 year old former smoker (~20 pack years) with a history of COPD, degenerative disc disease, allergic rhinitis, hyperlipidemia.  She has alpha-1 antitrypsin MZ phenotype.  Uses nocturnal oxygen.  She has been seen in our office before by Dr. Melvyn Novas, Dr. Elsworth Soho.  Has been managed on Symbicort and Spiriva for COPD.  Most recent pulmonary function testing 07/2015 showed an FEV1 of 1.18 L (49% predicted).  She is referred today by Dr. Acie Fredrickson for evaluation of a mixed density right upper lobe pulmonary nodule that was seen on coronary CT chest 11/28/2019. She is feeling well.   CT chest 11/28/2019 reviewed by me, shows an incompletely imaged mixed solid and subsolid right upper lobe nodule.   ROV 06/19/20 --follow-up visit for 75 year old woman with a history of tobacco, alpha-1 antitrypsin MZ with associated COPD. Also with DDD, allergic rhinitis, hyperlipidemia. She had an AE earlier this month, resolved without Pred or abx. She remains on Symbicort and Spiriva.  I saw her after a mixed density pulmonary nodules identified by cardiac CT in 11/2019.  Dedicated CT chest 01/01/2020 confirmed a 2.2 cm mixed density right upper lobe nodule, a 9 mm groundglass right middle lobe nodule, 9 mm right lower lobe nodule.   Repeat super D CT scan 06/17/2020 reviewed by me, shows overall stable appearance of the predominant right upper lobe nodule although is measured slightly larger at 2.5 x 1.5 cm.  The other scattered nodules are overall stable.  No new nodules present.   Review of Systems As per HPI     Objective:   Physical Exam Vitals:   06/19/20 1631  BP: 104/68  Pulse: 76  Temp: 98.4 F (36.9 C)  TempSrc: Temporal  SpO2: 92%  Weight: 152 lb 6.4 oz (69.1 kg)  Height: 5\' 6"  (1.676 m)   Gen: Pleasant, well-nourished, in no distress,  normal affect  ENT: No lesions,  mouth clear,  oropharynx clear, no  postnasal drip  Neck: No JVD, no stridor  Lungs: No use of accessory muscles, no crackles or wheezing on normal respiration, no wheeze on forced expiration  Cardiovascular: RRR, heart sounds normal, no murmur or gallops, no peripheral edem  Musculoskeletal: No deformities, no cyanosis or clubbing  Neuro: alert, awake, non focal  Skin: Warm, no lesions or rash      Assessment & Plan:  Pulmonary nodule Multiple pulmonary nodules.  The predominant 1 is a mixed density nodule in the right upper lobe.  It was measured at slightly larger on this scan 06/17/2020 but it looks stable to me compared with 6 months ago.  I think we can continue to follow it.  I talked her about the pros and cons of watchful waiting versus a more aggressive approach, bronchoscopy with biopsies now.  She is comfortable with surveillance.  If it does change then we will pursue tissue diagnosis and therapy.  Her other nodules especially in the right middle lobe and right lower lobe both look stable.  COPD GOLD III She had a recent flare but it resolved without prednisone or antibiotics.  She is on Symbicort and Spiriva.  Plan to continue same  Baltazar Apo, MD, PhD 06/19/2020, 5:13 PM [facility] [phone] or if no answer [phone]

## 2020-06-19 NOTE — Assessment & Plan Note (Signed)
Multiple pulmonary nodules.  The predominant 1 is a mixed density nodule in the right upper lobe.  It was measured at slightly larger on this scan 06/17/2020 but it looks stable to me compared with 6 months ago.  I think we can continue to follow it.  I talked her about the pros and cons of watchful waiting versus a more aggressive approach, bronchoscopy with biopsies now.  She is comfortable with surveillance.  If it does change then we will pursue tissue diagnosis and therapy.  Her other nodules especially in the right middle lobe and right lower lobe both look stable.

## 2020-06-26 ENCOUNTER — Other Ambulatory Visit: Payer: Self-pay

## 2020-06-26 NOTE — Patient Outreach (Signed)
[facility]) Care Management  1/44/3154  Joanne Rogers [birth year] [id number]    Telephone Screen  Referral Date: 06/25/2020 Referral Source: Join EMMI Campaign Referral Reason:"wants more info about [facility]"    Outreach attempt # 1 to patient. Spoke with patient who reported this was not a good time for her to talk. She states that she is currently out of town. Se requested a call back sometime next week at the earliest.       Plan: RN CM will make outreach attempt to patient next week per patient request.   Enzo Montgomery, RN,BSN,CCM [place] Management Telephonic Care Management Coordinator Direct Phone: [phone] Toll Free: [phone] Fax: [phone]

## 2020-06-29 DIAGNOSIS — S90521A Blister (nonthermal), right ankle, initial encounter: Secondary | ICD-10-CM | POA: Diagnosis not present

## 2020-06-29 DIAGNOSIS — T148XXA Other injury of unspecified body region, initial encounter: Secondary | ICD-10-CM | POA: Diagnosis not present

## 2020-06-29 DIAGNOSIS — L989 Disorder of the skin and subcutaneous tissue, unspecified: Secondary | ICD-10-CM | POA: Diagnosis not present

## 2020-07-03 ENCOUNTER — Other Ambulatory Visit: Payer: Self-pay

## 2020-07-03 NOTE — Patient Outreach (Signed)
[facility]) Care Management  3/90/3009  SHAILA GILCHREST [birth year] [id number]   Telephone Screen   Referral Date: 06/25/2020 Referral Source: Join EMMI Campaign Referral Reason:"wants more info about [facility]"    Outreach attempt # 2 to patient. Spoke with patient who reported she was still out of town assisting her daughter who had recent surgery. She did not wish to discuss [facility] services at this time until she returned home.      Plan: RN CM will make outreach attempt to patient within 3-4 business days.   Enzo Montgomery, RN,BSN,CCM [place] Management Telephonic Care Management Coordinator Direct Phone: [phone] Toll Free: [phone] Fax: [phone]

## 2020-07-04 ENCOUNTER — Ambulatory Visit: Payer: Self-pay

## 2020-07-09 ENCOUNTER — Other Ambulatory Visit: Payer: Self-pay

## 2020-07-09 NOTE — Patient Outreach (Signed)
[facility]) Care Management  0/31/2811  Joanne Rogers [birth year] [id number]   Telephone Screen   Referral Date: 06/25/2020 Referral Source: Join EMMI Campaign Referral Reason:"wants more info about [facility]"    Unsuccessful outreach attempt to patient-line busy after several tries.    Plan: RN CM will make outreach attempt to patient within 3-4 wks if no response from letter mailed to patient.  Enzo Montgomery, RN,BSN,CCM [place] Management Telephonic Care Management Coordinator Direct Phone: [phone] Toll Free: [phone] Fax: [phone]

## 2020-07-17 DIAGNOSIS — R202 Paresthesia of skin: Secondary | ICD-10-CM | POA: Diagnosis not present

## 2020-07-19 ENCOUNTER — Other Ambulatory Visit: Payer: Self-pay | Admitting: Family Medicine

## 2020-07-19 DIAGNOSIS — R202 Paresthesia of skin: Secondary | ICD-10-CM

## 2020-07-30 ENCOUNTER — Other Ambulatory Visit: Payer: Self-pay

## 2020-07-30 NOTE — Patient Outreach (Signed)
[facility]) Care Management  01/14/7251  MARLEE ARMENTEROS [birth year] [id number]   Telephone Screen   Referral Date: 06/25/2020 Referral Source: Join EMMI Campaign Referral Reason:"wants more info about [facility]"   Multiple attempts to establish contact with patient without success. No response from letter mailed to patient. Case is being closed at this time.    Plan: RN CM will close case.  Enzo Montgomery, RN,BSN,CCM [place] Management Telephonic Care Management Coordinator Direct Phone: [phone] Toll Free: [phone] Fax: [phone]

## 2020-07-31 ENCOUNTER — Ambulatory Visit
Admission: RE | Admit: 2020-07-31 | Discharge: 2020-07-31 | Disposition: A | Discharge status: Home / Self Care | Payer: Medicare HMO | Source: Ambulatory Visit | Attending: Family Medicine | Admitting: Family Medicine

## 2020-07-31 DIAGNOSIS — M79672 Pain in left foot: Secondary | ICD-10-CM | POA: Diagnosis not present

## 2020-07-31 DIAGNOSIS — R202 Paresthesia of skin: Secondary | ICD-10-CM

## 2020-07-31 DIAGNOSIS — I998 Other disorder of circulatory system: Secondary | ICD-10-CM | POA: Diagnosis not present

## 2020-07-31 DIAGNOSIS — M79671 Pain in right foot: Secondary | ICD-10-CM | POA: Diagnosis not present

## 2020-08-27 DIAGNOSIS — M8589 Other specified disorders of bone density and structure, multiple sites: Secondary | ICD-10-CM | POA: Diagnosis not present

## 2020-08-27 DIAGNOSIS — I5032 Chronic diastolic (congestive) heart failure: Secondary | ICD-10-CM | POA: Diagnosis not present

## 2020-08-27 DIAGNOSIS — E7800 Pure hypercholesterolemia, unspecified: Secondary | ICD-10-CM | POA: Diagnosis not present

## 2020-08-27 DIAGNOSIS — I700 Atherosclerosis of aorta: Secondary | ICD-10-CM | POA: Diagnosis not present

## 2020-08-27 DIAGNOSIS — J449 Chronic obstructive pulmonary disease, unspecified: Secondary | ICD-10-CM | POA: Diagnosis not present

## 2020-08-27 DIAGNOSIS — E538 Deficiency of other specified B group vitamins: Secondary | ICD-10-CM | POA: Diagnosis not present

## 2020-08-27 DIAGNOSIS — Z0000 Encounter for general adult medical examination without abnormal findings: Secondary | ICD-10-CM | POA: Diagnosis not present

## 2020-08-29 ENCOUNTER — Other Ambulatory Visit: Payer: Self-pay | Admitting: Family Medicine

## 2020-08-29 DIAGNOSIS — M8580 Other specified disorders of bone density and structure, unspecified site: Secondary | ICD-10-CM

## 2020-09-02 ENCOUNTER — Other Ambulatory Visit: Payer: Self-pay | Admitting: Family Medicine

## 2020-09-02 DIAGNOSIS — Z1231 Encounter for screening mammogram for malignant neoplasm of breast: Secondary | ICD-10-CM

## 2020-09-05 ENCOUNTER — Ambulatory Visit
Admission: RE | Admit: 2020-09-05 | Discharge: 2020-09-05 | Disposition: A | Discharge status: Home / Self Care | Payer: Medicare HMO | Source: Ambulatory Visit | Attending: Family Medicine | Admitting: Family Medicine

## 2020-09-05 ENCOUNTER — Other Ambulatory Visit: Payer: Self-pay

## 2020-09-05 DIAGNOSIS — M8589 Other specified disorders of bone density and structure, multiple sites: Secondary | ICD-10-CM | POA: Diagnosis not present

## 2020-09-05 DIAGNOSIS — Z780 Asymptomatic menopausal state: Secondary | ICD-10-CM | POA: Diagnosis not present

## 2020-09-05 DIAGNOSIS — M8580 Other specified disorders of bone density and structure, unspecified site: Secondary | ICD-10-CM

## 2020-10-01 DIAGNOSIS — Z23 Encounter for immunization: Secondary | ICD-10-CM | POA: Diagnosis not present

## 2020-10-02 DIAGNOSIS — L239 Allergic contact dermatitis, unspecified cause: Secondary | ICD-10-CM | POA: Diagnosis not present

## 2020-10-02 DIAGNOSIS — Z85828 Personal history of other malignant neoplasm of skin: Secondary | ICD-10-CM | POA: Diagnosis not present

## 2020-10-09 ENCOUNTER — Other Ambulatory Visit: Payer: Self-pay | Admitting: Internal Medicine

## 2020-10-16 ENCOUNTER — Ambulatory Visit
Admission: RE | Admit: 2020-10-16 | Discharge: 2020-10-16 | Disposition: A | Discharge status: Home / Self Care | Payer: Medicare HMO | Source: Ambulatory Visit | Attending: Family Medicine | Admitting: Family Medicine

## 2020-10-16 ENCOUNTER — Other Ambulatory Visit: Payer: Self-pay

## 2020-10-16 DIAGNOSIS — Z1231 Encounter for screening mammogram for malignant neoplasm of breast: Secondary | ICD-10-CM | POA: Diagnosis not present

## 2020-11-06 DIAGNOSIS — G8929 Other chronic pain: Secondary | ICD-10-CM | POA: Diagnosis not present

## 2020-11-06 DIAGNOSIS — E7800 Pure hypercholesterolemia, unspecified: Secondary | ICD-10-CM | POA: Diagnosis not present

## 2020-11-06 DIAGNOSIS — J441 Chronic obstructive pulmonary disease with (acute) exacerbation: Secondary | ICD-10-CM | POA: Diagnosis not present

## 2020-11-06 DIAGNOSIS — I5032 Chronic diastolic (congestive) heart failure: Secondary | ICD-10-CM | POA: Diagnosis not present

## 2020-11-06 DIAGNOSIS — J449 Chronic obstructive pulmonary disease, unspecified: Secondary | ICD-10-CM | POA: Diagnosis not present

## 2020-11-18 ENCOUNTER — Telehealth: Payer: Self-pay | Admitting: Cardiovascular Disease

## 2020-11-18 MED ORDER — BISOPROLOL FUMARATE 5 MG PO TABS: 5.0000 mg | ORAL_TABLET | Freq: Every day | ORAL | 0 refills | Status: DC

## 2020-11-18 MED ORDER — BISOPROLOL FUMARATE 5 MG PO TABS: 5.0000 mg | ORAL_TABLET | Freq: Every day | ORAL | 1 refills | Status: DC

## 2020-11-18 NOTE — Telephone Encounter (Signed)
*  STAT* If patient is at the pharmacy, call can be transferred to refill team.   1. Which medications need to be refilled? (please list name of each medication and dose if known) bisoprolol (ZEBETA) 5 MG tablet  2. Which pharmacy/location (including street and city if local pharmacy) is medication to be sent to?  [place], [place]  3. Do they need a 30 day or 90 day supply? 30 day to [facility] 90 day to [place], [place]    Patient only has 4 pills left

## 2020-11-18 NOTE — Addendum Note (Signed)
Addended by: Carter Kitten D on: 11/18/2020 11:35 AM   Modules accepted: Orders

## 2020-11-18 NOTE — Telephone Encounter (Signed)
[name] medication was sent to pt's pharmacy as requested. Confirmation received.  °

## 2020-11-26 ENCOUNTER — Telehealth: Payer: Self-pay | Admitting: Emergency Medicine

## 2020-11-26 MED ORDER — IPRATROPIUM-ALBUTEROL 0.5-2.5 (3) MG/3ML IN SOLN: 3.0000 mL | RESPIRATORY_TRACT | 1 refills | Status: AC

## 2020-11-26 NOTE — Telephone Encounter (Signed)
Refill has been sent to the pharmacy. Nothing further is needed. 

## 2020-12-03 DIAGNOSIS — Z01419 Encounter for gynecological examination (general) (routine) without abnormal findings: Secondary | ICD-10-CM | POA: Diagnosis not present

## 2020-12-03 DIAGNOSIS — Z780 Asymptomatic menopausal state: Secondary | ICD-10-CM | POA: Diagnosis not present

## 2020-12-06 ENCOUNTER — Telehealth: Payer: Self-pay | Admitting: Internal Medicine

## 2020-12-09 NOTE — Telephone Encounter (Signed)
We have pt assistance forms in Dr Agustina Caroli lookat folder in B pod  Spoke with Albin Fischer) and let them know Dr Lamonte Sakai will sign when returns to clinic  Thanks

## 2020-12-10 NOTE — Telephone Encounter (Signed)
Signed 12/10/20. Thanks.

## 2020-12-13 ENCOUNTER — Telehealth: Payer: Self-pay | Admitting: Emergency Medicine

## 2020-12-13 MED ORDER — SYMBICORT 160-4.5 MCG/ACT IN AERO
2.0000 | INHALATION_SPRAY | Freq: Two times a day (BID) | RESPIRATORY_TRACT | 5 refills | Status: DC
Start: 2020-12-13 — End: 2021-08-22

## 2020-12-13 NOTE — Telephone Encounter (Signed)
I called the patient and let her know per the CT that Dr. Lamonte Sakai would wait for CT until next year. She voices understanding and will keep the follow up at the end of the month with Dr. Lamonte Sakai.

## 2020-12-16 ENCOUNTER — Telehealth: Payer: Self-pay | Admitting: Emergency Medicine

## 2020-12-16 NOTE — Telephone Encounter (Signed)
I have called the pt and she stated that she has never heard of or used that pharmacy that called our office.  She stated that she uses [facility] [address].  I will sign off of this message.

## 2020-12-19 ENCOUNTER — Telehealth: Payer: Self-pay | Admitting: Emergency Medicine

## 2020-12-20 DIAGNOSIS — E7800 Pure hypercholesterolemia, unspecified: Secondary | ICD-10-CM | POA: Diagnosis not present

## 2020-12-20 DIAGNOSIS — I5032 Chronic diastolic (congestive) heart failure: Secondary | ICD-10-CM | POA: Diagnosis not present

## 2020-12-20 DIAGNOSIS — G8929 Other chronic pain: Secondary | ICD-10-CM | POA: Diagnosis not present

## 2020-12-20 DIAGNOSIS — J449 Chronic obstructive pulmonary disease, unspecified: Secondary | ICD-10-CM | POA: Diagnosis not present

## 2020-12-20 DIAGNOSIS — J441 Chronic obstructive pulmonary disease with (acute) exacerbation: Secondary | ICD-10-CM | POA: Diagnosis not present

## 2020-12-20 NOTE — Telephone Encounter (Signed)
Joanne Rogers, please advise if you have seen patient assistance paperwork on pt.

## 2020-12-23 NOTE — Telephone Encounter (Signed)
[place]  [facility] calling a/b missing section of pt assist form plcall them @  [phone] Caren Griffins  said you were missing Sec. 4 pg 5 NPI  Sec 6 med name a & strength .Hillery Hunter

## 2020-12-23 NOTE — Telephone Encounter (Signed)
Obtained form that had the info that was missing and have filled out all missing parts. Form has been refaxed to [employer]. Nothing further needed.

## 2020-12-26 ENCOUNTER — Other Ambulatory Visit: Payer: Self-pay | Admitting: Emergency Medicine

## 2020-12-26 MED ORDER — SPIRIVA RESPIMAT 2.5 MCG/ACT IN AERS: INHALATION_SPRAY | RESPIRATORY_TRACT | 3 refills | Status: DC

## 2021-01-08 ENCOUNTER — Other Ambulatory Visit: Payer: Self-pay

## 2021-01-08 ENCOUNTER — Ambulatory Visit: Payer: Medicare HMO | Admitting: Emergency Medicine

## 2021-01-08 ENCOUNTER — Encounter: Payer: Self-pay | Admitting: Emergency Medicine

## 2021-01-08 DIAGNOSIS — R058 Other specified cough: Secondary | ICD-10-CM | POA: Diagnosis not present

## 2021-01-08 DIAGNOSIS — J449 Chronic obstructive pulmonary disease, unspecified: Secondary | ICD-10-CM

## 2021-01-08 DIAGNOSIS — J301 Allergic rhinitis due to pollen: Secondary | ICD-10-CM | POA: Diagnosis not present

## 2021-01-08 DIAGNOSIS — R911 Solitary pulmonary nodule: Secondary | ICD-10-CM

## 2021-01-08 DIAGNOSIS — J309 Allergic rhinitis, unspecified: Secondary | ICD-10-CM | POA: Insufficient documentation

## 2021-01-08 NOTE — Patient Instructions (Signed)
Please continue your Symbicort and Spiriva Respimat as you have been taking them. We will look into pending paperwork for financial assistance from the pharmaceutical companies. Keep your albuterol available to use 2 puffs when needed for shortness of breath, chest tightness, wheezing. Try to stay active Flu shot up-to-date Recommend getting the new COVID-19 booster shot Continue your Astelin nasal spray twice a day. We will repeat your CT scan of the chest in June 2023 to follow your pulmonary nodule Follow Dr. Lamonte Sakai in June to review your scan or sooner if you have any problems.

## 2021-01-08 NOTE — Assessment & Plan Note (Signed)
Continue Astelin nasal spray twice a day

## 2021-01-08 NOTE — Progress Notes (Signed)
Subjective:    Patient ID: Joanne Rogers, female    DOB: 25-Nov-1945, 75 y.o.   MRN: [id number]  HPI 75 year old former smoker (~20 pack years) with a history of COPD, degenerative disc disease, allergic rhinitis, hyperlipidemia.  She has alpha-1 antitrypsin MZ phenotype.  Uses nocturnal oxygen.  She has been seen in our office before by Dr. Melvyn Novas, Dr. Elsworth Soho.  Has been managed on Symbicort and Spiriva for COPD.  Most recent pulmonary function testing 07/2015 showed an FEV1 of 1.18 L (49% predicted).  She is referred today by Dr. Acie Fredrickson for evaluation of a mixed density right upper lobe pulmonary nodule that was seen on coronary CT chest 11/28/2019. She is feeling well.   CT chest 11/28/2019 reviewed by me, shows an incompletely imaged mixed solid and subsolid right upper lobe nodule.   ROV 06/19/20 --follow-up visit for 75 year old woman with a history of tobacco, alpha-1 antitrypsin MZ with associated COPD. Also with DDD, allergic rhinitis, hyperlipidemia. She had an AE earlier this month, resolved without Pred or abx. She remains on Symbicort and Spiriva.  I saw her after a mixed density pulmonary nodules identified by cardiac CT in 11/2019.  Dedicated CT chest 01/01/2020 confirmed a 2.2 cm mixed density right upper lobe nodule, a 9 mm groundglass right middle lobe nodule, 9 mm right lower lobe nodule.   Repeat super D CT scan 06/17/2020 reviewed by me, shows overall stable appearance of the predominant right upper lobe nodule although is measured slightly larger at 2.5 x 1.5 cm.  The other scattered nodules are overall stable.  No new nodules present.   ROV 01/08/21 --follow-up visit 75 year old woman with a history of COPD, alpha-1 antitrypsin MZ.  We have also been following pulmonary nodular disease including a mixed density 2.5 x 1.5 cm right upper lobe pulmonary nodule.  She has been managed on Symbicort and Spiriva.  Today she reports that her breathing remains somewhat limiting, bothers her with  wearing a mask, with exertion like household chores. She has SOB carrying groceries. Uses albuterol 3x a week. No flares since last time.  Uses astein NS.  She has had 3 covid vaccines, hasn't yet had the new booster Flu shot up to date.     Review of Systems As per HPI     Objective:   Physical Exam Vitals:   01/08/21 1004  BP: 128/80  Pulse: 84  Temp: 98.1 F (36.7 C)  TempSrc: Oral  SpO2: 97%  Weight: 155 lb 6.4 oz (70.5 kg)  Height: 5\' 6"  (1.676 m)   Gen: Pleasant, well-nourished, in no distress,  normal affect  ENT: No lesions,  mouth clear,  oropharynx clear, no postnasal drip  Neck: No JVD, no stridor  Lungs: No use of accessory muscles, no crackles or wheezing on normal respiration, no wheeze on forced expiration  Cardiovascular: RRR, heart sounds normal, no murmur or gallops, no peripheral edem  Musculoskeletal: No deformities, no cyanosis or clubbing  Neuro: alert, awake, non focal  Skin: Warm, no lesions or rash      Assessment & Plan:  COPD GOLD III Overall stable.  Remains active but does still get exertional shortness of breath.  Symptoms well managed.  No flare since last time.  Albuterol 3 times a week.  Discussed with her that she may benefit from addition of Mucinex to help with secretion clearance.  We will order an alpha-1 antitrypsin level today.  Please continue your Symbicort and Spiriva Respimat as you have been  taking them. We will look into pending paperwork for financial assistance from the pharmaceutical companies. Keep your albuterol available to use 2 puffs when needed for shortness of breath, chest tightness, wheezing. Try to stay active Flu shot up-to-date Recommend getting the new COVID-19 booster shot Follow Dr. Lamonte Sakai in June to review your scan or sooner if you have any problems.  Pulmonary nodule Groundglass pulmonary nodule, looks like a slow-growing adenocarcinoma.  We are following with serial films.  Next is in June  2023.  Upper airway cough syndrome Well-controlled at this time  Allergic rhinitis Continue Astelin nasal spray twice a day  Baltazar Apo, MD, PhD 01/08/2021, 10:26 AM [facility] [phone] or if no answer [phone]

## 2021-01-08 NOTE — Assessment & Plan Note (Signed)
Well-controlled  at this time 

## 2021-01-08 NOTE — Addendum Note (Signed)
Addended by: Lorretta Harp on: 01/08/2021 10:31 AM   Modules accepted: Orders

## 2021-01-08 NOTE — Assessment & Plan Note (Signed)
Groundglass pulmonary nodule, looks like a slow-growing adenocarcinoma.  We are following with serial films.  Next is in June 2023.

## 2021-01-08 NOTE — Assessment & Plan Note (Addendum)
Overall stable.  Remains active but does still get exertional shortness of breath.  Symptoms well managed.  No flare since last time.  Albuterol 3 times a week.  Discussed with her that she may benefit from addition of Mucinex to help with secretion clearance.  We will order an alpha-1 antitrypsin level today.  Please continue your Symbicort and Spiriva Respimat as you have been taking them. We will look into pending paperwork for financial assistance from the pharmaceutical companies. Keep your albuterol available to use 2 puffs when needed for shortness of breath, chest tightness, wheezing. Try to stay active Flu shot up-to-date Recommend getting the new COVID-19 booster shot Follow Dr. Lamonte Sakai in June to review your scan or sooner if you have any problems.

## 2021-01-18 LAB — ALPHA-1 ANTITRYPSIN PHENOTYPE: A-1 Antitrypsin, Ser: 96 mg/dL (ref 83–199)

## 2021-02-03 DIAGNOSIS — J441 Chronic obstructive pulmonary disease with (acute) exacerbation: Secondary | ICD-10-CM | POA: Diagnosis not present

## 2021-02-03 DIAGNOSIS — E7800 Pure hypercholesterolemia, unspecified: Secondary | ICD-10-CM | POA: Diagnosis not present

## 2021-02-03 DIAGNOSIS — G8929 Other chronic pain: Secondary | ICD-10-CM | POA: Diagnosis not present

## 2021-02-03 DIAGNOSIS — I5032 Chronic diastolic (congestive) heart failure: Secondary | ICD-10-CM | POA: Diagnosis not present

## 2021-02-03 DIAGNOSIS — J449 Chronic obstructive pulmonary disease, unspecified: Secondary | ICD-10-CM | POA: Diagnosis not present

## 2021-02-24 DIAGNOSIS — L820 Inflamed seborrheic keratosis: Secondary | ICD-10-CM | POA: Diagnosis not present

## 2021-02-24 DIAGNOSIS — Z85828 Personal history of other malignant neoplasm of skin: Secondary | ICD-10-CM | POA: Diagnosis not present

## 2021-02-24 DIAGNOSIS — D1801 Hemangioma of skin and subcutaneous tissue: Secondary | ICD-10-CM | POA: Diagnosis not present

## 2021-02-24 DIAGNOSIS — L821 Other seborrheic keratosis: Secondary | ICD-10-CM | POA: Diagnosis not present

## 2021-02-24 DIAGNOSIS — D225 Melanocytic nevi of trunk: Secondary | ICD-10-CM | POA: Diagnosis not present

## 2021-02-27 DIAGNOSIS — J449 Chronic obstructive pulmonary disease, unspecified: Secondary | ICD-10-CM | POA: Diagnosis not present

## 2021-02-27 DIAGNOSIS — I700 Atherosclerosis of aorta: Secondary | ICD-10-CM | POA: Diagnosis not present

## 2021-02-27 DIAGNOSIS — E538 Deficiency of other specified B group vitamins: Secondary | ICD-10-CM | POA: Diagnosis not present

## 2021-02-27 DIAGNOSIS — E7800 Pure hypercholesterolemia, unspecified: Secondary | ICD-10-CM | POA: Diagnosis not present

## 2021-03-14 DIAGNOSIS — H4720 Unspecified optic atrophy: Secondary | ICD-10-CM | POA: Diagnosis not present

## 2021-03-14 DIAGNOSIS — H35372 Puckering of macula, left eye: Secondary | ICD-10-CM | POA: Diagnosis not present

## 2021-03-14 DIAGNOSIS — Z961 Presence of intraocular lens: Secondary | ICD-10-CM | POA: Diagnosis not present

## 2021-03-14 DIAGNOSIS — H52203 Unspecified astigmatism, bilateral: Secondary | ICD-10-CM | POA: Diagnosis not present

## 2021-03-25 DIAGNOSIS — M79604 Pain in right leg: Secondary | ICD-10-CM | POA: Diagnosis not present

## 2021-04-14 ENCOUNTER — Other Ambulatory Visit: Payer: Self-pay | Admitting: Cardiovascular Disease

## 2021-05-21 DIAGNOSIS — M25511 Pain in right shoulder: Secondary | ICD-10-CM | POA: Diagnosis not present

## 2021-06-05 DIAGNOSIS — G8929 Other chronic pain: Secondary | ICD-10-CM | POA: Diagnosis not present

## 2021-06-05 DIAGNOSIS — E7800 Pure hypercholesterolemia, unspecified: Secondary | ICD-10-CM | POA: Diagnosis not present

## 2021-06-05 DIAGNOSIS — J449 Chronic obstructive pulmonary disease, unspecified: Secondary | ICD-10-CM | POA: Diagnosis not present

## 2021-06-05 DIAGNOSIS — I5032 Chronic diastolic (congestive) heart failure: Secondary | ICD-10-CM | POA: Diagnosis not present

## 2021-06-12 ENCOUNTER — Ambulatory Visit (HOSPITAL_BASED_OUTPATIENT_CLINIC_OR_DEPARTMENT_OTHER)
Admission: RE | Admit: 2021-06-12 | Discharge: 2021-06-12 | Disposition: A | Discharge status: Home / Self Care | Payer: Medicare HMO | Source: Ambulatory Visit | Attending: Emergency Medicine | Admitting: Emergency Medicine

## 2021-06-12 DIAGNOSIS — R911 Solitary pulmonary nodule: Secondary | ICD-10-CM | POA: Insufficient documentation

## 2021-06-12 DIAGNOSIS — I700 Atherosclerosis of aorta: Secondary | ICD-10-CM | POA: Diagnosis not present

## 2021-06-12 DIAGNOSIS — R918 Other nonspecific abnormal finding of lung field: Secondary | ICD-10-CM | POA: Diagnosis not present

## 2021-06-16 DIAGNOSIS — H52209 Unspecified astigmatism, unspecified eye: Secondary | ICD-10-CM | POA: Diagnosis not present

## 2021-06-16 DIAGNOSIS — H524 Presbyopia: Secondary | ICD-10-CM | POA: Diagnosis not present

## 2021-06-16 DIAGNOSIS — H5203 Hypermetropia, bilateral: Secondary | ICD-10-CM | POA: Diagnosis not present

## 2021-06-17 ENCOUNTER — Encounter: Payer: Self-pay | Admitting: Emergency Medicine

## 2021-06-17 ENCOUNTER — Ambulatory Visit (INDEPENDENT_AMBULATORY_CARE_PROVIDER_SITE_OTHER): Payer: Medicare HMO | Admitting: Emergency Medicine

## 2021-06-17 DIAGNOSIS — R911 Solitary pulmonary nodule: Secondary | ICD-10-CM | POA: Diagnosis not present

## 2021-06-17 DIAGNOSIS — J449 Chronic obstructive pulmonary disease, unspecified: Secondary | ICD-10-CM

## 2021-06-17 MED ORDER — SPIRIVA RESPIMAT 2.5 MCG/ACT IN AERS: 2.0000 | INHALATION_SPRAY | Freq: Every day | RESPIRATORY_TRACT | 0 refills | Status: DC

## 2021-06-17 NOTE — Assessment & Plan Note (Signed)
Currently on Spiriva and Symbicort.  She is interested in possibly participating in a clinical trial in [place].  If she does so I have asked her to keep me updated on whether there is been any medication changes, see if we can have access to any pulmonary function testing that is performed.

## 2021-06-17 NOTE — Progress Notes (Signed)
Subjective:    Patient ID: Joanne Rogers, female    DOB: 02-May-1945, 76 y.o.   MRN: [id number]  HPI  ROV 06/19/20 --follow-up visit for 76 year old woman with a history of tobacco, alpha-1 antitrypsin MZ with associated COPD. Also with DDD, allergic rhinitis, hyperlipidemia. She had an AE earlier this month, resolved without Pred or abx. She remains on Symbicort and Spiriva.  I saw her after a mixed density pulmonary nodules identified by cardiac CT in 11/2019.  Dedicated CT chest 01/01/2020 confirmed a 2.2 cm mixed density right upper lobe nodule, a 9 mm groundglass right middle lobe nodule, 9 mm right lower lobe nodule.   Repeat super D CT scan 06/17/2020 reviewed by me, shows overall stable appearance of the predominant right upper lobe nodule although is measured slightly larger at 2.5 x 1.5 cm.  The other scattered nodules are overall stable.  No new nodules present.   ROV 01/08/21 --follow-up visit 76 year old woman with a history of COPD, alpha-1 antitrypsin MZ.  We have also been following pulmonary nodular disease including a mixed density 2.5 x 1.5 cm right upper lobe pulmonary nodule.  She has been managed on Symbicort and Spiriva.  Today she reports that her breathing remains somewhat limiting, bothers her with wearing a mask, with exertion like household chores. She has SOB carrying groceries. Uses albuterol 3x a week. No flares since last time.  Uses astein NS.  She has had 3 covid vaccines, hasn't yet had the new booster Flu shot up to date.    ROV 06/17/2021 --76 year old woman with history of alpha-1 antitrypsin MZ, COPD, pulmonary nodular disease which we have been following.  She has a mixed density 2.5 x 1.5 right upper lobe pulmonary nodule, scattered stable nodules  CT chest 06/12/2021 reviewed by me shows that the mixed density groundglass right upper lobe pulmonary nodule of interest is increased in size 2.6 x 1.8 cm, other pulmonary nodules are stable measuring up to 9 mm in  the right middle lobe.  No new nodules seen.  Pulmonary function testing 07/2015 reviewed, showed very severe obstruction.    Review of Systems As per HPI     Objective:   Physical Exam Vitals:   06/17/21 1147  BP: 124/76  Pulse: 60  Temp: 98.5 F (36.9 C)  TempSrc: Oral  SpO2: 95%  Weight: 159 lb 6.4 oz (72.3 kg)  Height: 5\' 6"  (1.676 m)   Gen: Pleasant, well-nourished, in no distress,  normal affect  ENT: No lesions,  mouth clear,  oropharynx clear, no postnasal drip  Neck: No JVD, no stridor  Lungs: No use of accessory muscles, no crackles or wheezing on normal respiration, no wheeze on forced expiration  Cardiovascular: RRR, heart sounds normal, no murmur or gallops, no peripheral edem  Musculoskeletal: No deformities, no cyanosis or clubbing  Neuro: alert, awake, non focal  Skin: Warm, no lesions or rash      Assessment & Plan:  Pulmonary nodule Hazy groundglass right upper lobe pulmonary nodule may be slightly more prominent than previous scan 1 year ago.  Overall it looks like there is been some very slight increase going back to her original from November 2021.  Suspect that this is a slow-growing adenocarcinoma.  We talked about possible bronchoscopy today.  For now we will defer given the risk/benefit profile.  I will repeat a CT chest along with a PET scan in 3 months to further risk stratify.  At that time we can talk about either  watchful waiting or bronchoscopy.  COPD GOLD III Currently on Spiriva and Symbicort.  She is interested in possibly participating in a clinical trial in [place].  If she does so I have asked her to keep me updated on whether there is been any medication changes, see if we can have access to any pulmonary function testing that is performed.  Baltazar Apo, MD, PhD 06/17/2021, 12:33 PM [facility] [phone] or if no answer [phone]

## 2021-06-17 NOTE — Addendum Note (Signed)
Addended by: Gavin Potters R on: 06/17/2021 12:54 PM   Modules accepted: Orders

## 2021-06-17 NOTE — Assessment & Plan Note (Signed)
Hazy groundglass right upper lobe pulmonary nodule may be slightly more prominent than previous scan 1 year ago.  Overall it looks like there is been some very slight increase going back to her original from November 2021.  Suspect that this is a slow-growing adenocarcinoma.  We talked about possible bronchoscopy today.  For now we will defer given the risk/benefit profile.  I will repeat a CT chest along with a PET scan in 3 months to further risk stratify.  At that time we can talk about either watchful waiting or bronchoscopy.

## 2021-06-17 NOTE — Patient Instructions (Signed)
We reviewed your CT scan of the chest today. We discussed the pros and cons of possible bronchoscopy to evaluate your hazy right upper lobe pulmonary nodule.  At this time we will hold off on any invasive procedure. We will plan a repeat CT scan/PET scan in September 2023. Continue your Symbicort and Spiriva as you have been taking them Keep albuterol available to use 2 puffs when you needed for shortness of breath, chest tightness, wheezing. Follow Dr. Lamonte Sakai in September after your scans are done so we can review the results together.

## 2021-06-27 ENCOUNTER — Telehealth: Payer: Self-pay | Admitting: Cardiovascular Disease

## 2021-06-27 NOTE — Telephone Encounter (Signed)
  Patient would like to switch from Dr Acie Fredrickson to Dr Harriet Masson

## 2021-07-08 DIAGNOSIS — M25511 Pain in right shoulder: Secondary | ICD-10-CM | POA: Diagnosis not present

## 2021-07-22 DIAGNOSIS — M25511 Pain in right shoulder: Secondary | ICD-10-CM | POA: Diagnosis not present

## 2021-08-07 DIAGNOSIS — M25511 Pain in right shoulder: Secondary | ICD-10-CM | POA: Diagnosis not present

## 2021-08-07 DIAGNOSIS — M75101 Unspecified rotator cuff tear or rupture of right shoulder, not specified as traumatic: Secondary | ICD-10-CM | POA: Diagnosis not present

## 2021-08-07 DIAGNOSIS — M75121 Complete rotator cuff tear or rupture of right shoulder, not specified as traumatic: Secondary | ICD-10-CM | POA: Diagnosis not present

## 2021-08-19 ENCOUNTER — Encounter: Payer: Self-pay | Admitting: Cardiology

## 2021-08-19 ENCOUNTER — Ambulatory Visit: Payer: Medicare HMO | Admitting: Cardiology

## 2021-08-19 VITALS — BP 134/64 | HR 72 | Ht 66.0 in | Wt 156.2 lb

## 2021-08-19 DIAGNOSIS — I2510 Atherosclerotic heart disease of native coronary artery without angina pectoris: Secondary | ICD-10-CM | POA: Diagnosis not present

## 2021-08-19 DIAGNOSIS — E782 Mixed hyperlipidemia: Secondary | ICD-10-CM

## 2021-08-19 MED ORDER — BISOPROLOL FUMARATE 5 MG PO TABS: 5.0000 mg | ORAL_TABLET | Freq: Every day | ORAL | 3 refills | Status: DC

## 2021-08-19 NOTE — Progress Notes (Signed)
Cardiology Office Note:    Date:  01/18/5535   ID:  DHAMAR GREGORY, DOB [birth year], MRN [id number]  PCP:  [name], MD  Cardiologist:  Berniece Salines, DO  Electrophysiologist:  None   Referring MD: [name], *     " I am ok"  History of Present Illness:    Joanne Rogers is a 76 y.o. female with a hx of coronary artery disease seen on coronary CT scan with mild obstructive disease in the LAD, and minimal disease in the left circumflex as well as RCA, hyperlipidemia is not on any statin tells me that she has not had any good reaction with statins she has been on simvastatin and other statins to medications which she could not need and tells me she that she experienced significant muscle weakness and could not get out of bed and do anything.  She previously followed with Dr. Acie Fredrickson and requested to switch providers.  She was last seen by Dr. Katharina Caper in April 2022 at that time we discussed with her about the mild to moderate coronary artery disease.  He also expressed to her that her LDL was high and shared discussion for other lipid-lowering agents.  He was referred to the lipid clinic.  She offers no complaints at this time.  Past Medical History:  Diagnosis Date   Arthritis    back, hands, neck   COPD (chronic obstructive pulmonary disease) (HCC)    DDD (degenerative disc disease), lumbar    Dyspnea    H/O bronchitis    allergy related   Hypercholesterolemia    Palpitations    "fluttering"   Pneumonia    hx of    Past Surgical History:  Procedure Laterality Date   CARDIOVASCULAR STRESS TEST  11/16/2005   EF 70%, NO ISCHEMIA   CARPAL TUNNEL RELEASE Right ~1988   DILATION AND CURETTAGE OF UTERUS     x3: x2 for miscarrages and x1 for polyp   HEMI-MICRODISCECTOMY LUMBAR LAMINECTOMY LEVEL 1 Left 07/20/2014   Procedure: centeral decompression,L4-L5, HEMI-LAMINECTOMY MICRODISCECTOMY L4 - L5 ON THE LEFT  LEVEL 1, forimatomy 4-5 root on the left;  Surgeon:  Latanya Maudlin, MD;  Location: [facility];  Service: Orthopedics;  Laterality: Left;   US ECHOCARDIOGRAPHY  11/19/2005   EF 55-60%    Current Medications: Current Meds  Medication Sig   albuterol (VENTOLIN HFA) 108 (90 Base) MCG/ACT inhaler Inhale 1-2 puffs into the lungs every 4 (four) hours as needed for wheezing or shortness of breath.   Ascorbic Acid (VITAMIN C PO) Take 1 tablet by mouth daily.   aspirin 81 MG tablet Take 81 mg by mouth at bedtime.   azelastine (ASTELIN) 0.1 % nasal spray Place 1 spray into both nostrils as needed.   beta carotene w/minerals (OCUVITE) tablet Take 1 tablet by mouth at bedtime.   cetirizine (ZYRTEC) 10 MG tablet Take 10 mg by mouth at bedtime.   Cyanocobalamin (VITAMIN B12) 1000 MCG TBCR 1 tablet   famotidine (PEPCID) 20 MG tablet One at bedtime   ipratropium-albuterol (DUONEB) 0.5-2.5 (3) MG/3ML SOLN Take 3 mLs by nebulization every 4 (four) hours. Dx: J44.9   Multiple Minerals-Vitamins (CALCIUM-MAGNESIUM-ZINC-D3) TABS Take 3 tablets by mouth daily.   OXYGEN 2lpm with sleep   Respiratory Therapy Supplies (FLUTTER) DEVI 1 Device by Does not apply route as needed.   SYMBICORT 160-4.5 MCG/ACT inhaler Inhale 2 puffs into the lungs in the morning and at bedtime.   Tiotropium Bromide Monohydrate (SPIRIVA  RESPIMAT) 2.5 MCG/ACT AERS Inhale 2 puffs into the lungs daily.   triamcinolone cream (KENALOG) 0.1 % 1 a small amount to affected area twice a day   valACYclovir (VALTREX) 1000 MG tablet Take 500 mg by mouth every other day.    [DISCONTINUED] bisoprolol (ZEBETA) 5 MG tablet TAKE 1 TABLET EVERY DAY     Allergies:   Codeine, Amoxicillin, Crestor [rosuvastatin calcium], Latex, Levofloxacin, Lovastatin, Simvastatin, and Statins   Social History   Socioeconomic History   Marital status: Widowed    Spouse name: Not on file   Number of children: Not on file   Years of education: Not on file   Highest education level: Not on file  Occupational History    Occupation: caregiver  Tobacco Use   Smoking status: Former    Packs/day: 0.50    Years: 25.00    Total pack years: 12.50    Types: Cigarettes    Quit date: 05/16/2005    Years since quitting: 16.2   Smokeless tobacco: Never  Vaping Use   Vaping Use: Never used  Substance and Sexual Activity   Alcohol use: Yes    Alcohol/week: 6.0 standard drinks of alcohol    Types: 6 Standard drinks or equivalent per week    Comment: social   Drug use: No   Sexual activity: Not on file  Other Topics Concern   Not on file  Social History Narrative   Children   Lives alone   Denies caffeine use.   Social Determinants of Health   Financial Resource Strain: Not on file  Food Insecurity: Not on file  Transportation Needs: Not on file  Physical Activity: Not on file  Stress: Not on file  Social Connections: Not on file     Family History: The patient's family history includes Breast cancer (age of onset: 61) in her mother; Emphysema in her maternal grandfather.  ROS:   Review of Systems  Constitution: Negative for decreased appetite, fever and weight gain.  HENT: Negative for congestion, ear discharge, hoarse voice and sore throat.   Eyes: Negative for discharge, redness, vision loss in right eye and visual halos.  Cardiovascular: Negative for chest pain, dyspnea on exertion, leg swelling, orthopnea and palpitations.  Respiratory: Negative for cough, hemoptysis, shortness of breath and snoring.   Endocrine: Negative for heat intolerance and polyphagia.  Hematologic/Lymphatic: Negative for bleeding problem. Does not bruise/bleed easily.  Skin: Negative for flushing, nail changes, rash and suspicious lesions.  Musculoskeletal: Negative for arthritis, joint pain, muscle cramps, myalgias, neck pain and stiffness.  Gastrointestinal: Negative for abdominal pain, bowel incontinence, diarrhea and excessive appetite.  Genitourinary: Negative for decreased libido, genital sores and incomplete  emptying.  Neurological: Negative for brief paralysis, focal weakness, headaches and loss of balance.  Psychiatric/Behavioral: Negative for altered mental status, depression and suicidal ideas.  Allergic/Immunologic: Negative for HIV exposure and persistent infections.    EKGs/Labs/Other Studies Reviewed:    The following studies were reviewed today:   EKG: None today  Coronary CT scan 11/28/2019 FINDINGS: Coronary calcium score: The patient's coronary artery calcium score is 150, which places the patient in the 60 percentile.   Coronary arteries: Normal coronary origins.  Right dominance.   Right Coronary Artery: Normal caliber vessel, gives rise to PDA. Mild scattered calcified and noncalcified plaque, maximal stenosis 1-24%.   Left Main Coronary Artery: Normal caliber vessel. No significant plaque or stenosis.   Left Anterior Descending Coronary Artery: Normal caliber vessel. Scattered mixed calcified and noncalcified  plaque throughout proximal to mid LAD. Highest grade stenosis 25-49%. Myocardial bridging in mid LAD of approximately 13 mm. Gives rise to 2 diagonal branches. Distal LAD wraps apex.   Left Circumflex Artery: Normal caliber vessel. Mild scattered calcified and noncalcified plaque, maximal stenosis 1-24%. Gives rise to 2 OM branches.   Aorta: Normal size, 29 mm at the mid ascending aorta (level of the PA bifurcation) measured double oblique. Scattered calcifications consistent with aortic atherosclerosis. No dissection.   Aortic Valve: Mild calcifications. Trileaflet.   Other findings:   Normal pulmonary vein drainage into the left atrium.   Normal left atrial appendage without a thrombus.   Normal size of the pulmonary artery.   IMPRESSION: 1.  Mild nonobstructive CAD, CADRADS = 2.   2. Coronary calcium score of 150. This was 60th percentile for age and sex matched control.   3.  Normal coronary origin with right dominance.   4.  Myocardial  bridge in mid LAD.   Electronically Signed: By: Buford Dresser M.D. On: 11/28/2019 12:46    Recent Labs: No results found for requested labs within last 365 days.  Recent Lipid Panel    Component Value Date/Time   CHOL 234 (H) 05/08/2020 0909   TRIG 95 05/08/2020 0909   HDL 77 05/08/2020 0909   CHOLHDL 3.0 05/08/2020 0909   CHOLHDL 6 12/14/2013 1103   VLDL 28.2 12/14/2013 1103   LDLCALC 141 (H) 05/08/2020 0909   LDLDIRECT 171 (H) 11/17/2019 0831   LDLDIRECT 162.1 02/16/2013 0745    Physical Exam:    VS:  BP 134/64   Pulse 72   Ht 5\' 6"  (1.676 m)   Wt 156 lb 3.2 oz (70.9 kg)   SpO2 91%   BMI 25.21 kg/m     Wt Readings from Last 3 Encounters:  08/19/21 156 lb 3.2 oz (70.9 kg)  06/17/21 159 lb 6.4 oz (72.3 kg)  01/08/21 155 lb 6.4 oz (70.5 kg)     GEN: Well nourished, well developed in no acute distress HEENT: Normal NECK: No JVD; No carotid bruits LYMPHATICS: No lymphadenopathy CARDIAC: S1S2 noted,RRR, no murmurs, rubs, gallops RESPIRATORY:  Clear to auscultation without rales, wheezing or rhonchi  ABDOMEN: Soft, non-tender, non-distended, +bowel sounds, no guarding. EXTREMITIES: No edema, No cyanosis, no clubbing MUSCULOSKELETAL:  No deformity  SKIN: Warm and dry NEUROLOGIC:  Alert and oriented x 3, non-focal PSYCHIATRIC:  Normal affect, good insight  ASSESSMENT:    1. Mixed hyperlipidemia   2. Coronary artery disease involving native coronary artery of native heart without angina pectoris    PLAN:     Coronary artery disease-no anginal symptoms I again spoke with the patient I printed out her coronary CT report and reviewed this with her.  Showing that she does have multivessel mild to moderate disease.  Explained to the patient that he would understand the importance of why Dr. Katharina Caper was trying to get her to take lipid-lowering agent because her goal should be less than 55, her last LDL in April 2022 shows that her LDL was 141.  I also expressed  with her that it would be important for me to refer her to the lipid clinic to consider other lipid-lowering agents but she like to think about this.  In the meantime we will repeat her lipid profile.  Based on my conversation with her she is also worried about cost of these medications.  Explained to the patient that when she speak with the pharmacist that she can  also speak with the insurance company to understand what the co-pay will be.  We can also consider other nausea assistance programs from the pharmaceutical company for possible free medications if she fills out the assistance form.  The patient is in agreement with the above plan. The patient left the office in stable condition.  The patient will follow up in   Medication Adjustments/Labs and Tests Ordered: Current medicines are reviewed at length with the patient today.  Concerns regarding medicines are outlined above.  Orders Placed This Encounter  Procedures   Lipid panel   Lipoprotein A (LPA)   Meds ordered this encounter  Medications   bisoprolol (ZEBETA) 5 MG tablet    Sig: Take 1 tablet (5 mg total) by mouth daily.    Dispense:  90 tablet    Refill:  3    Patient Instructions  Medication Instructions:  Your physician recommends that you continue on your current medications as directed. Please refer to the Current Medication list given to you today.  *If you need a refill on your cardiac medications before your next appointment, please call your pharmacy*   Lab Work: TODAY: Lipids, Lp(a)  If you have labs (blood work) drawn today and your tests are completely normal, you will receive your results only by: [place] (if you have MyChart) OR A paper copy in the mail If you have any lab test that is abnormal or we need to change your treatment, we will call you to review the results.   Testing/Procedures: None   Follow-Up: At [facility], you and your health needs are our priority.  As part of our  continuing mission to provide you with exceptional heart care, we have created designated Provider Care Teams.  These Care Teams include your primary Cardiologist (physician) and Advanced Practice Providers (APPs -  Physician Assistants and Nurse Practitioners) who all work together to provide you with the care you need, when you need it.  We recommend signing up for the patient portal called "MyChart".  Sign up information is provided on this After Visit Summary.  MyChart is used to connect with patients for Virtual Visits (Telemedicine).  Patients are able to view lab/test results, encounter notes, upcoming appointments, etc.  Non-urgent messages can be sent to your provider as well.   To learn more about what you can do with MyChart, go to [email or web address].    Your next appointment:   1 year(s)  The format for your next appointment:   In Person  Provider:   Berniece Salines, DO    Other Instructions   Important Information About Sugar         Adopting a Healthy Lifestyle.  Know what a healthy weight is for you (roughly BMI <25) and aim to maintain this   Aim for 7+ servings of fruits and vegetables daily   65-80+ fluid ounces of water or unsweet tea for healthy kidneys   Limit to max 1 drink of alcohol per day; avoid smoking/tobacco   Limit animal fats in diet for cholesterol and heart health - choose grass fed whenever available   Avoid highly processed foods, and foods high in saturated/trans fats   Aim for low stress - take time to unwind and care for your mental health   Aim for 150 min of moderate intensity exercise weekly for heart health, and weights twice weekly for bone health   Aim for 7-9 hours of sleep daily   When it comes to diets, agreement about  the perfect plan isnt easy to find, even among the experts. Experts at the [employer] developed an idea known as the Healthy Eating Plate. Just imagine a plate divided into logical,  healthy portions.   The emphasis is on diet quality:   Load up on vegetables and fruits - one-half of your plate: Aim for color and variety, and remember that potatoes dont count.   Go for whole grains - one-quarter of your plate: Whole wheat, barley, wheat berries, quinoa, oats, brown rice, and foods made with them. If you want pasta, go with whole wheat pasta.   Protein power - one-quarter of your plate: Fish, chicken, beans, and nuts are all healthy, versatile protein sources. Limit red meat.   The diet, however, does go beyond the plate, offering a few other suggestions.   Use healthy plant oils, such as olive, canola, soy, corn, sunflower and peanut. Check the labels, and avoid partially hydrogenated oil, which have unhealthy trans fats.   If youre thirsty, drink water. Coffee and tea are good in moderation, but skip sugary drinks and limit milk and dairy products to one or two daily servings.   The type of carbohydrate in the diet is more important than the amount. Some sources of carbohydrates, such as vegetables, fruits, whole grains, and beans-are healthier than others.   Finally, stay active  Signed, Berniece Salines, DO  08/19/2021 11:22 AM    [place]

## 2021-08-19 NOTE — Patient Instructions (Signed)
Medication Instructions:  Your physician recommends that you continue on your current medications as directed. Please refer to the Current Medication list given to you today.  *If you need a refill on your cardiac medications before your next appointment, please call your pharmacy*   Lab Work: TODAY: Lipids, Lp(a)  If you have labs (blood work) drawn today and your tests are completely normal, you will receive your results only by: [place] (if you have MyChart) OR A paper copy in the mail If you have any lab test that is abnormal or we need to change your treatment, we will call you to review the results.   Testing/Procedures: None   Follow-Up: At [facility], you and your health needs are our priority.  As part of our continuing mission to provide you with exceptional heart care, we have created designated Provider Care Teams.  These Care Teams include your primary Cardiologist (physician) and Advanced Practice Providers (APPs -  Physician Assistants and Nurse Practitioners) who all work together to provide you with the care you need, when you need it.  We recommend signing up for the patient portal called "MyChart".  Sign up information is provided on this After Visit Summary.  MyChart is used to connect with patients for Virtual Visits (Telemedicine).  Patients are able to view lab/test results, encounter notes, upcoming appointments, etc.  Non-urgent messages can be sent to your provider as well.   To learn more about what you can do with MyChart, go to [email or web address].    Your next appointment:   1 year(s)  The format for your next appointment:   In Person  Provider:   Berniece Salines, DO    Other Instructions   Important Information About Sugar

## 2021-08-20 LAB — LIPOPROTEIN A (LPA): Lipoprotein (a): 225.3 nmol/L — ABNORMAL HIGH (ref <75.0)

## 2021-08-20 LAB — LIPID PANEL
Chol/HDL Ratio: 3.8 ratio (ref 0.0–4.4)
Cholesterol, Total: 255 mg/dL — ABNORMAL HIGH (ref 100–199)
HDL: 68 mg/dL (ref >39)
LDL Chol Calc (NIH): 164 mg/dL — ABNORMAL HIGH (ref 0–99)
Triglycerides: 129 mg/dL (ref 0–149)
VLDL Cholesterol Cal: 23 mg/dL (ref 5–40)

## 2021-08-21 ENCOUNTER — Telehealth: Payer: Self-pay | Admitting: Emergency Medicine

## 2021-08-22 MED ORDER — SYMBICORT 160-4.5 MCG/ACT IN AERO: 2.0000 | INHALATION_SPRAY | Freq: Two times a day (BID) | RESPIRATORY_TRACT | 11 refills | Status: DC

## 2021-08-22 NOTE — Telephone Encounter (Signed)
Rx for pt's Symbicort has been sent electronically to [facility] which is [facility] pharmacy. Called and spoke with pt letting her know this had been done and she verbalized understanding. Nothing further needed.

## 2021-08-28 ENCOUNTER — Other Ambulatory Visit: Payer: Self-pay

## 2021-08-28 DIAGNOSIS — E782 Mixed hyperlipidemia: Secondary | ICD-10-CM

## 2021-08-28 DIAGNOSIS — E7800 Pure hypercholesterolemia, unspecified: Secondary | ICD-10-CM

## 2021-08-28 NOTE — Progress Notes (Signed)
Referral placed per Dr.Tobb's request.

## 2021-09-04 DIAGNOSIS — M8589 Other specified disorders of bone density and structure, multiple sites: Secondary | ICD-10-CM | POA: Diagnosis not present

## 2021-09-04 DIAGNOSIS — E538 Deficiency of other specified B group vitamins: Secondary | ICD-10-CM | POA: Diagnosis not present

## 2021-09-04 DIAGNOSIS — Z0000 Encounter for general adult medical examination without abnormal findings: Secondary | ICD-10-CM | POA: Diagnosis not present

## 2021-09-04 DIAGNOSIS — J449 Chronic obstructive pulmonary disease, unspecified: Secondary | ICD-10-CM | POA: Diagnosis not present

## 2021-09-04 DIAGNOSIS — R911 Solitary pulmonary nodule: Secondary | ICD-10-CM | POA: Diagnosis not present

## 2021-09-04 DIAGNOSIS — E7800 Pure hypercholesterolemia, unspecified: Secondary | ICD-10-CM | POA: Diagnosis not present

## 2021-09-04 DIAGNOSIS — I5032 Chronic diastolic (congestive) heart failure: Secondary | ICD-10-CM | POA: Diagnosis not present

## 2021-09-04 DIAGNOSIS — I700 Atherosclerosis of aorta: Secondary | ICD-10-CM | POA: Diagnosis not present

## 2021-09-09 ENCOUNTER — Telehealth: Payer: Self-pay | Admitting: Emergency Medicine

## 2021-09-09 DIAGNOSIS — J449 Chronic obstructive pulmonary disease, unspecified: Secondary | ICD-10-CM

## 2021-09-09 NOTE — Telephone Encounter (Signed)
[facility] is requesting a copy of records and an order for the Patient's Oxygen concentrator. SHe was with [facility] but has switched to [facility]. The requested information can be faxed to [phone] the fax should include prescription,  insurance info, demographic info and medical records. The patient uses 5 Liters

## 2021-09-11 NOTE — Telephone Encounter (Signed)
Yes please do

## 2021-09-11 NOTE — Telephone Encounter (Signed)
Patient is switching O2 services to [facility].  [facility] is requesting updated prescription for O2 concentrator order for 5L O2, OV notes, demographics to be faxed to [phone].  Dr. Lamonte Sakai, please advise if you are ok for new DME order for O2 concentrator at current 5L rate

## 2021-09-12 ENCOUNTER — Other Ambulatory Visit: Payer: Self-pay | Admitting: Family Medicine

## 2021-09-12 DIAGNOSIS — Z1231 Encounter for screening mammogram for malignant neoplasm of breast: Secondary | ICD-10-CM

## 2021-09-12 NOTE — Telephone Encounter (Signed)
Order placed. Nothing further needed at this time. 

## 2021-09-17 ENCOUNTER — Other Ambulatory Visit (HOSPITAL_COMMUNITY): Payer: Medicare HMO

## 2021-09-17 ENCOUNTER — Encounter (HOSPITAL_COMMUNITY)
Admission: RE | Admit: 2021-09-17 | Discharge: 2021-09-17 | Disposition: A | Discharge status: Home / Self Care | Payer: Medicare HMO | Source: Ambulatory Visit | Attending: Emergency Medicine | Admitting: Emergency Medicine

## 2021-09-17 DIAGNOSIS — R911 Solitary pulmonary nodule: Secondary | ICD-10-CM | POA: Insufficient documentation

## 2021-09-17 DIAGNOSIS — R918 Other nonspecific abnormal finding of lung field: Secondary | ICD-10-CM | POA: Diagnosis not present

## 2021-09-17 DIAGNOSIS — J439 Emphysema, unspecified: Secondary | ICD-10-CM | POA: Diagnosis not present

## 2021-09-17 LAB — GLUCOSE, CAPILLARY: Glucose-Capillary: 110 mg/dL — ABNORMAL HIGH (ref 70–99)

## 2021-09-17 MED ORDER — FLUDEOXYGLUCOSE F - 18 (FDG) INJECTION
10.0000 | Freq: Once | INTRAVENOUS | Status: AC
  Administered 2021-09-17: 7.77 via INTRAVENOUS

## 2021-09-18 ENCOUNTER — Encounter: Payer: Self-pay | Admitting: Emergency Medicine

## 2021-09-18 ENCOUNTER — Ambulatory Visit (INDEPENDENT_AMBULATORY_CARE_PROVIDER_SITE_OTHER): Payer: Medicare HMO | Admitting: Emergency Medicine

## 2021-09-18 VITALS — BP 128/70 | HR 74 | Temp 97.9°F | Ht 66.0 in | Wt 157.4 lb

## 2021-09-18 DIAGNOSIS — J449 Chronic obstructive pulmonary disease, unspecified: Secondary | ICD-10-CM | POA: Diagnosis not present

## 2021-09-18 DIAGNOSIS — R911 Solitary pulmonary nodule: Secondary | ICD-10-CM

## 2021-09-18 NOTE — Assessment & Plan Note (Signed)
Continue inhaled medicine as she has been taking it

## 2021-09-18 NOTE — Assessment & Plan Note (Addendum)
Her groundglass pulmonary nodule shows very faint hypermetabolism consistent with highly differentiated adenocarcinoma.  It has been slowly growing in the entire clinical picture is consistent with this.  Unfortunately there is now an adjacent right hilar node that is hypermetabolic on the PET.  I talked to her about the options for evaluation and treatment.  Could consider referral to radiation oncology to consider empiric SBRT, but given the right hilar node I think there would be significant value in getting tissue if possible.  This would facilitate molecular studies to determine whether she is a candidate for targeted therapy or immunotherapy.  I have recommended navigational bronchoscopy and EBUS to evaluate the nodule and the node if it is reachable.  Either way we should be able to get enough tissue to send for ancillary studies.  She understands the rationale and agrees.  We will try to get this set up for 09/29/2021.  We reviewed your CT scan and PET scan today. We will work on arranging bronchoscopy.  This will be done under general anesthesia as an outpatient at [facility] endoscopy.  You will need a designated driver.  You will need to stop your aspirin 2 days prior.  We will try to get this scheduled for 09/29/2021. Please continue your inhaled medications as you have been taking them Follow with Dr Lamonte Sakai in 1 month or next available

## 2021-09-18 NOTE — H&P (View-Only) (Signed)
Subjective:    Patient ID: Joanne Rogers, female    DOB: 29-Jun-1945, 76 y.o.   MRN: [id number]  HPI  ROV 01/08/21 --follow-up visit 76 year old woman with a history of COPD, alpha-1 antitrypsin MZ.  We have also been following pulmonary nodular disease including a mixed density 2.5 x 1.5 cm right upper lobe pulmonary nodule.  She has been managed on Symbicort and Spiriva.  Today she reports that her breathing remains somewhat limiting, bothers her with wearing a mask, with exertion like household chores. She has SOB carrying groceries. Uses albuterol 3x a week. No flares since last time.  Uses astein NS.  She has had 3 covid vaccines, hasn't yet had the new booster Flu shot up to date.   ROV 06/17/2021 --76 year old woman with history of alpha-1 antitrypsin MZ, COPD, pulmonary nodular disease which we have been following.  She has a mixed density 2.5 x 1.5 right upper lobe pulmonary nodule, scattered stable nodules  CT chest 06/12/2021 reviewed by me shows that the mixed density groundglass right upper lobe pulmonary nodule of interest is increased in size 2.6 x 1.8 cm, other pulmonary nodules are stable measuring up to 9 mm in the right middle lobe.  No new nodules seen.  Pulmonary function testing 07/2015 reviewed, showed very severe obstruction.    ROV 09/18/2021 --this follow-up visit for 76 year old woman with history of COPD, documented severe obstruction, alpha-1 antitrypsin MZ.  She has a mixed density 2.6 cm right upper lobe pulmonary nodule that we have been following with serial imaging.  It has grown over time but we have been following conservatively.  Also other stable scattered nodules measuring up to 9 mm in the right middle lobe.  We decided to perform a PET CT to follow the nodule as below.  PET scan super D CT done 09/17/2021 and reviewed by me shows that the right upper lobe groundglass lesion has low-level hypermetabolism consistent with an adenocarcinoma.  There is also a 9 mm  right hilar lymph node that is hypermetabolic and suggestive of metastatic adenopathy.  No evidence of distant metastatic disease.    Review of Systems As per HPI     Objective:   Physical Exam Vitals:   09/18/21 1606  BP: 128/70  Pulse: 74  Temp: 97.9 F (36.6 C)  TempSrc: Oral  SpO2: 92%  Weight: 157 lb 6.4 oz (71.4 kg)  Height: 5\' 6"  (1.676 m)   Gen: Pleasant, well-nourished, in no distress,  normal affect  ENT: No lesions,  mouth clear,  oropharynx clear, no postnasal drip  Neck: No JVD, no stridor  Lungs: No use of accessory muscles, no crackles or wheezing on normal respiration, no wheeze on forced expiration  Cardiovascular: RRR, heart sounds normal, no murmur or gallops, no peripheral edem  Musculoskeletal: No deformities, no cyanosis or clubbing  Neuro: alert, awake, non focal  Skin: Warm, no lesions or rash      Assessment & Plan:  Pulmonary nodule Her groundglass pulmonary nodule shows very faint hypermetabolism consistent with highly differentiated adenocarcinoma.  It has been slowly growing in the entire clinical picture is consistent with this.  Unfortunately there is now an adjacent right hilar node that is hypermetabolic on the PET.  I talked to her about the options for evaluation and treatment.  Could consider referral to radiation oncology to consider empiric SBRT, but given the right hilar node I think there would be significant value in getting tissue if possible.  This would facilitate  molecular studies to determine whether she is a candidate for targeted therapy or immunotherapy.  I have recommended navigational bronchoscopy and EBUS to evaluate the nodule and the node if it is reachable.  Either way we should be able to get enough tissue to send for ancillary studies.  She understands the rationale and agrees.  We will try to get this set up for 09/29/2021.  We reviewed your CT scan and PET scan today. We will work on arranging bronchoscopy.  This  will be done under general anesthesia as an outpatient at [facility] endoscopy.  You will need a designated driver.  You will need to stop your aspirin 2 days prior.  We will try to get this scheduled for 09/29/2021. Please continue your inhaled medications as you have been taking them Follow with Dr Lamonte Sakai in 1 month or next available  COPD GOLD III Continue inhaled medicine as she has been taking it  Time spent 40 minutes  Baltazar Apo, MD, PhD 09/18/2021, 5:17 PM [facility] [phone] or if no answer [phone]

## 2021-09-18 NOTE — Patient Instructions (Addendum)
We reviewed your CT scan and PET scan today. We will work on arranging bronchoscopy.  This will be done under general anesthesia as an outpatient at [facility] endoscopy.  You will need a designated driver.  You will need to stop your aspirin 2 days prior.  We will try to get this scheduled for 09/29/2021. Please continue your inhaled medications as you have been taking them Follow with Dr Lamonte Sakai in 1 month or next available

## 2021-09-18 NOTE — Progress Notes (Signed)
Subjective:    Patient ID: Joanne Rogers, female    DOB: 05-25-1945, 76 y.o.   MRN: [id number]  HPI  ROV 01/08/21 --follow-up visit 76 year old woman with a history of COPD, alpha-1 antitrypsin MZ.  We have also been following pulmonary nodular disease including a mixed density 2.5 x 1.5 cm right upper lobe pulmonary nodule.  She has been managed on Symbicort and Spiriva.  Today she reports that her breathing remains somewhat limiting, bothers her with wearing a mask, with exertion like household chores. She has SOB carrying groceries. Uses albuterol 3x a week. No flares since last time.  Uses astein NS.  She has had 3 covid vaccines, hasn't yet had the new booster Flu shot up to date.   ROV 06/17/2021 --76 year old woman with history of alpha-1 antitrypsin MZ, COPD, pulmonary nodular disease which we have been following.  She has a mixed density 2.5 x 1.5 right upper lobe pulmonary nodule, scattered stable nodules  CT chest 06/12/2021 reviewed by me shows that the mixed density groundglass right upper lobe pulmonary nodule of interest is increased in size 2.6 x 1.8 cm, other pulmonary nodules are stable measuring up to 9 mm in the right middle lobe.  No new nodules seen.  Pulmonary function testing 07/2015 reviewed, showed very severe obstruction.    ROV 09/18/2021 --this follow-up visit for 76 year old woman with history of COPD, documented severe obstruction, alpha-1 antitrypsin MZ.  She has a mixed density 2.6 cm right upper lobe pulmonary nodule that we have been following with serial imaging.  It has grown over time but we have been following conservatively.  Also other stable scattered nodules measuring up to 9 mm in the right middle lobe.  We decided to perform a PET CT to follow the nodule as below.  PET scan super D CT done 09/17/2021 and reviewed by me shows that the right upper lobe groundglass lesion has low-level hypermetabolism consistent with an adenocarcinoma.  There is also a 9 mm  right hilar lymph node that is hypermetabolic and suggestive of metastatic adenopathy.  No evidence of distant metastatic disease.    Review of Systems As per HPI     Objective:   Physical Exam Vitals:   09/18/21 1606  BP: 128/70  Pulse: 74  Temp: 97.9 F (36.6 C)  TempSrc: Oral  SpO2: 92%  Weight: 157 lb 6.4 oz (71.4 kg)  Height: 5\' 6"  (1.676 m)   Gen: Pleasant, well-nourished, in no distress,  normal affect  ENT: No lesions,  mouth clear,  oropharynx clear, no postnasal drip  Neck: No JVD, no stridor  Lungs: No use of accessory muscles, no crackles or wheezing on normal respiration, no wheeze on forced expiration  Cardiovascular: RRR, heart sounds normal, no murmur or gallops, no peripheral edem  Musculoskeletal: No deformities, no cyanosis or clubbing  Neuro: alert, awake, non focal  Skin: Warm, no lesions or rash      Assessment & Plan:  Pulmonary nodule Her groundglass pulmonary nodule shows very faint hypermetabolism consistent with highly differentiated adenocarcinoma.  It has been slowly growing in the entire clinical picture is consistent with this.  Unfortunately there is now an adjacent right hilar node that is hypermetabolic on the PET.  I talked to her about the options for evaluation and treatment.  Could consider referral to radiation oncology to consider empiric SBRT, but given the right hilar node I think there would be significant value in getting tissue if possible.  This would facilitate  molecular studies to determine whether she is a candidate for targeted therapy or immunotherapy.  I have recommended navigational bronchoscopy and EBUS to evaluate the nodule and the node if it is reachable.  Either way we should be able to get enough tissue to send for ancillary studies.  She understands the rationale and agrees.  We will try to get this set up for 09/29/2021.  We reviewed your CT scan and PET scan today. We will work on arranging bronchoscopy.  This  will be done under general anesthesia as an outpatient at [facility] endoscopy.  You will need a designated driver.  You will need to stop your aspirin 2 days prior.  We will try to get this scheduled for 09/29/2021. Please continue your inhaled medications as you have been taking them Follow with Dr Lamonte Sakai in 1 month or next available  COPD GOLD III Continue inhaled medicine as she has been taking it  Time spent 40 minutes  Baltazar Apo, MD, PhD 09/18/2021, 5:17 PM [facility] [phone] or if no answer [phone]

## 2021-09-24 ENCOUNTER — Ambulatory Visit: Payer: Medicare HMO | Admitting: Emergency Medicine

## 2021-09-25 ENCOUNTER — Ambulatory Visit: Payer: Medicare HMO

## 2021-09-26 ENCOUNTER — Encounter (HOSPITAL_COMMUNITY): Payer: Self-pay | Admitting: Emergency Medicine

## 2021-09-26 ENCOUNTER — Other Ambulatory Visit: Payer: Self-pay | Admitting: *Deleted

## 2021-09-26 ENCOUNTER — Other Ambulatory Visit: Payer: Medicare HMO

## 2021-09-26 DIAGNOSIS — Z01818 Encounter for other preprocedural examination: Secondary | ICD-10-CM

## 2021-09-26 NOTE — Progress Notes (Signed)
PCP - Dr Lindell Noe Cardiologist - Dr Harriet Masson  CT Chest x-ray - 06/12/21 EKG - DOS Stress Test - 11/16/05 ECHO - 05/17/19 Cardiac Cath - n/a  ICD Pacemaker/Loop - n/a  Sleep Study -  n/a CPAP - none   Uses 2L of Oxygen at qhs.  Aspirin Instructions: Hold ASA 2 days prior to surgery per MD.  Anesthesia review: Yes  STOP now taking any Aspirin (unless otherwise instructed by your surgeon), Aleve, Naproxen, Ibuprofen, Motrin, Advil, Goody's, BC's, all herbal medications, fish oil, and all vitamins.   Coronavirus Screening Covid test is scheduled on 09/26/21 - pending Do you have any of the following symptoms:  Cough No Fever (>100.11F)  yes/no: No Runny nose yes/no: No Sore throat Yes Difficulty breathing/shortness of breath  [place], [facility]  Have you traveled in the last 14 days and where?   Patient verbalized understanding of instructions that were given via phone/

## 2021-09-26 NOTE — Anesthesia Preprocedure Evaluation (Signed)
Anesthesia Evaluation  Patient identified by MRN, date of birth, ID band Patient awake    Reviewed: Allergy & Precautions, NPO status , Patient's Chart, lab work & pertinent test results  Airway Mallampati: II  TM Distance: >3 FB Neck ROM: Full    Dental no notable dental hx.    Pulmonary COPD,  COPD inhaler, former smoker,  RUL mass   Pulmonary exam normal        Cardiovascular hypertension, Pt. on medications and Pt. on home beta blockers + CAD and +CHF   Rhythm:Regular Rate:Normal     Neuro/Psych negative neurological ROS  negative psych ROS   GI/Hepatic negative GI ROS, Neg liver ROS,   Endo/Other  negative endocrine ROS  Renal/GU negative Renal ROS  negative genitourinary   Musculoskeletal  (+) Arthritis , Osteoarthritis,    Abdominal Normal abdominal exam  (+)   Peds  Hematology negative hematology ROS (+)   Anesthesia Other Findings   Reproductive/Obstetrics                            Anesthesia Physical Anesthesia Plan  ASA: 3  Anesthesia Plan: General   Post-op Pain Management:    Induction: Intravenous  PONV Risk Score and Plan: 3 and Ondansetron, Dexamethasone and Treatment may vary due to age or medical condition  Airway Management Planned: Mask and Oral ETT  Additional Equipment: None  Intra-op Plan:   Post-operative Plan: Extubation in OR  Informed Consent: I have reviewed the patients History and Physical, chart, labs and discussed the procedure including the risks, benefits and alternatives for the proposed anesthesia with the patient or authorized representative who has indicated his/her understanding and acceptance.     Dental advisory given  Plan Discussed with: CRNA  Anesthesia Plan Comments: (PAT note written 09/26/2021 by Myra Gianotti, PA-C. )       Anesthesia Quick Evaluation

## 2021-09-26 NOTE — Progress Notes (Signed)
Anesthesia Chart Review: Joanne Rogers  Case: [id number] Date/Time: 09/29/21 1100   Procedures:      ROBOTIC ASSISTED NAVIGATIONAL BRONCHOSCOPY     VIDEO BRONCHOSCOPY WITH ENDOBRONCHIAL ULTRASOUND (Right)   Anesthesia type: General   Pre-op diagnosis: RIGHT UPPER LOBE NODULE MASS   Location: [facility]ARD[facility] ROOM 3 / [facility]   Surgeons: Collene Gobble, MD       DISCUSSION: Patient is a 76 year old female scheduled for the above procedure.  She has a right upper lobe lung lesion that is low level hypermetabolic on PET scan.  She is a former smoker.  Per Dr. Agustina Caroli notes, "Pulmonary function testing 07/2015 reviewed, showed very severe obstruction."  Above procedure recommended for tissue diagnosis.  Other history includes former smoker (quit 05/16/05), COPD (Gold III, nocturnal O2 2L), alpha 1 antitrypsin MZ, dyspnea, CAD (mild CAD with myocardial bridge mid LAD 11/28/19 CCTA), palpitations, HLD, spinal surgery (left L4-5 laminectomy/microdiscectomy 07/20/14).  Last cardiology evaluation was on 08/19/2021 by Dr. Harriet Masson.  Mild CAD by 2021 CCTA.  No anginal symptoms.  Discussed referral to lipid clinic.  1 year follow-up planned.  EKG was not done at that visit.  She is a same-day work-up.  Last available labs are greater than 30 days ago and last EKG greater than 1 year ago.  Anesthesia team to evaluate on the day of surgery.  Dr. Lamonte Sakai advised holding aspirin 2 days prior to procedure.   VS:  BP Readings from Last 3 Encounters:  09/18/21 128/70  08/19/21 134/64  06/17/21 124/76   Pulse Readings from Last 3 Encounters:  09/18/21 74  08/19/21 72  06/17/21 60     PROVIDERS: [name], MD is PCP  Berniece Salines, DO is cardiologist   LABS: For day of surgery as indicated.     IMAGES: PET Scan 09/17/21: IMPRESSION: 1. Right upper lobe ground-glass lesion demonstrates low level hypermetabolism and is most consistent with a primary lung neoplasm (adenocarcinoma). 9 mm  right hilar node is hypermetabolic and consistent with metastatic adenopathy. 2. No findings for abdominal/pelvic metastatic disease or osseous metastatic disease.   CT Super D Chest 06/12/21: IMPRESSION: 1. Increase in size of the ground-glass right upper lobe pulmonary nodule which now measures 2.6 x 1.8 cm and is suspicious for slow growing bronchogenic neoplasm, recommend further evaluation with nuclear medicine PET-CT. 2. Additional bilateral pulmonary nodules measure up to 9 mm are stable from prior. Attention on follow-up imaging suggested. 3.  No thoracic adenopathy. 4.  Aortic Atherosclerosis (ICD10-I70.0).    EKG: Last EKG available was greater than 1 year ago; 08/31/2019: Normal sinus rhythm, nonspecific ST and T wave abnormality.   CV: CT Coronary 11/28/19: FINDINGS: Coronary calcium score: The patient's coronary artery calcium score is 150, which places the patient in the 60 percentile.   Coronary arteries: Normal coronary origins.  Right dominance.   Right Coronary Artery: Normal caliber vessel, gives rise to PDA. Mild scattered calcified and noncalcified plaque, maximal stenosis 1-24%.   Left Main Coronary Artery: Normal caliber vessel. No significant plaque or stenosis.   Left Anterior Descending Coronary Artery: Normal caliber vessel. Scattered mixed calcified and noncalcified plaque throughout proximal to mid LAD. Highest grade stenosis 25-49%. Myocardial bridging in mid LAD of approximately 13 mm. Gives rise to 2 diagonal branches. Distal LAD wraps apex.   Left Circumflex Artery: Normal caliber vessel. Mild scattered calcified and noncalcified plaque, maximal stenosis 1-24%. Gives rise to 2 OM branches.   Aorta: Normal size,  29 mm at the mid ascending aorta (level of the PA bifurcation) measured double oblique. Scattered calcifications consistent with aortic atherosclerosis. No dissection.   Aortic Valve: Mild calcifications. Trileaflet.   Other  findings:   Normal pulmonary vein drainage into the left atrium.   Normal left atrial appendage without a thrombus.   Normal size of the pulmonary artery.   IMPRESSION: 1.  Mild nonobstructive CAD, CADRADS = 2. 2. Coronary calcium score of 150. This was 60th percentile for age and sex matched control. 3.  Normal coronary origin with right dominance. 4.  Myocardial bridge in mid LAD.    Echo 05/17/19: IMPRESSIONS   1. Left ventricular ejection fraction, by estimation, is 55 to 60%. The  left ventricle has normal function. The left ventricle has no regional  wall motion abnormalities. Left ventricular diastolic parameters were  normal.   2. Right ventricular systolic function is normal. The right ventricular  size is normal.   3. The mitral valve is normal in structure. No evidence of mitral valve  regurgitation. No evidence of mitral stenosis.   4. The aortic valve is tricuspid. Aortic valve regurgitation is not  visualized. No aortic stenosis is present.   5. The inferior vena cava is normal in size with greater than 50%  respiratory variability, suggesting right atrial pressure of 3 mmHg.  - Comparison(s): Prior images unable to be directly viewed, comparison made  by report only.   Past Medical History:  Diagnosis Date   Arthritis    back, hands, neck   COPD (chronic obstructive pulmonary disease) (HCC)    DDD (degenerative disc disease), lumbar    Dyspnea    H/O bronchitis    allergy related   Hypercholesterolemia    Palpitations    "fluttering"   Pneumonia    hx of    Past Surgical History:  Procedure Laterality Date   CARDIOVASCULAR STRESS TEST  11/16/2005   EF 70%, NO ISCHEMIA   CARPAL TUNNEL RELEASE Right ~1988   DILATION AND CURETTAGE OF UTERUS     x3: x2 for miscarrages and x1 for polyp   HEMI-MICRODISCECTOMY LUMBAR LAMINECTOMY LEVEL 1 Left 07/20/2014   Procedure: centeral decompression,L4-L5, HEMI-LAMINECTOMY MICRODISCECTOMY L4 - L5 ON THE LEFT  LEVEL 1,  forimatomy 4-5 root on the left;  Surgeon: Latanya Maudlin, MD;  Location: [facility] ORS;  Service: Orthopedics;  Laterality: Left;   US ECHOCARDIOGRAPHY  11/19/2005   EF 55-60%    MEDICATIONS: No current facility-administered medications for this encounter.    acetaminophen (TYLENOL) 500 MG tablet   albuterol (VENTOLIN HFA) 108 (90 Base) MCG/ACT inhaler   ascorbic acid (VITAMIN C) 500 MG tablet   aspirin 81 MG tablet   azelastine (ASTELIN) 0.1 % nasal spray   beta carotene w/minerals (OCUVITE) tablet   bisoprolol (ZEBETA) 5 MG tablet   cetirizine (ZYRTEC) 10 MG tablet   cholecalciferol (VITAMIN D3) 25 MCG (1000 UNIT) tablet   Cyanocobalamin (B-12) 2500 MCG TABS   famotidine (PEPCID) 20 MG tablet   ibuprofen (ADVIL) 200 MG tablet   ipratropium-albuterol (DUONEB) 0.5-2.5 (3) MG/3ML SOLN   Multiple Minerals-Vitamins (CAL MAG ZINC +D3) TABS   Multiple Vitamins-Minerals (EMERGEN-C VITAMIN C) PACK   Polyethyl Glycol-Propyl Glycol (SYSTANE OP)   SYMBICORT 160-4.5 MCG/ACT inhaler   Tiotropium Bromide Monohydrate (SPIRIVA RESPIMAT) 2.5 MCG/ACT AERS   valACYclovir (VALTREX) 1000 MG tablet   OXYGEN   Respiratory Therapy Supplies (FLUTTER) DEVI    Myra Gianotti, PA-C Surgical Short Stay/Anesthesiology [facility] Phone [phone]  [facility] Phone [phone] 09/26/2021 1:48 PM

## 2021-09-29 ENCOUNTER — Ambulatory Visit (HOSPITAL_BASED_OUTPATIENT_CLINIC_OR_DEPARTMENT_OTHER): Payer: Medicare HMO | Admitting: Vascular Surgery

## 2021-09-29 ENCOUNTER — Encounter (HOSPITAL_COMMUNITY)
Admission: RE | Disposition: A | Discharge status: Home / Self Care | Payer: Self-pay | Source: Home / Self Care | Attending: Emergency Medicine

## 2021-09-29 ENCOUNTER — Ambulatory Visit (HOSPITAL_COMMUNITY): Payer: Medicare HMO

## 2021-09-29 ENCOUNTER — Other Ambulatory Visit: Payer: Self-pay

## 2021-09-29 ENCOUNTER — Encounter (HOSPITAL_COMMUNITY): Payer: Self-pay | Admitting: Emergency Medicine

## 2021-09-29 ENCOUNTER — Ambulatory Visit (HOSPITAL_COMMUNITY): Payer: Medicare HMO | Admitting: Vascular Surgery

## 2021-09-29 ENCOUNTER — Ambulatory Visit (HOSPITAL_COMMUNITY)
Admission: RE | Admit: 2021-09-29 | Discharge: 2021-09-29 | Disposition: A | Discharge status: Home / Self Care | Payer: Medicare HMO | Attending: Emergency Medicine | Admitting: Emergency Medicine

## 2021-09-29 DIAGNOSIS — R590 Localized enlarged lymph nodes: Secondary | ICD-10-CM | POA: Diagnosis not present

## 2021-09-29 DIAGNOSIS — R911 Solitary pulmonary nodule: Secondary | ICD-10-CM

## 2021-09-29 DIAGNOSIS — J449 Chronic obstructive pulmonary disease, unspecified: Secondary | ICD-10-CM | POA: Diagnosis not present

## 2021-09-29 DIAGNOSIS — C3490 Malignant neoplasm of unspecified part of unspecified bronchus or lung: Secondary | ICD-10-CM | POA: Diagnosis not present

## 2021-09-29 DIAGNOSIS — I509 Heart failure, unspecified: Secondary | ICD-10-CM | POA: Insufficient documentation

## 2021-09-29 DIAGNOSIS — Z87891 Personal history of nicotine dependence: Secondary | ICD-10-CM

## 2021-09-29 DIAGNOSIS — Z20822 Contact with and (suspected) exposure to covid-19: Secondary | ICD-10-CM | POA: Diagnosis not present

## 2021-09-29 DIAGNOSIS — I700 Atherosclerosis of aorta: Secondary | ICD-10-CM | POA: Diagnosis not present

## 2021-09-29 DIAGNOSIS — C3411 Malignant neoplasm of upper lobe, right bronchus or lung: Secondary | ICD-10-CM | POA: Insufficient documentation

## 2021-09-29 DIAGNOSIS — R918 Other nonspecific abnormal finding of lung field: Secondary | ICD-10-CM | POA: Diagnosis not present

## 2021-09-29 DIAGNOSIS — Z7951 Long term (current) use of inhaled steroids: Secondary | ICD-10-CM | POA: Insufficient documentation

## 2021-09-29 DIAGNOSIS — I2510 Atherosclerotic heart disease of native coronary artery without angina pectoris: Secondary | ICD-10-CM | POA: Insufficient documentation

## 2021-09-29 DIAGNOSIS — I110 Hypertensive heart disease with heart failure: Secondary | ICD-10-CM | POA: Diagnosis not present

## 2021-09-29 HISTORY — PX: VIDEO BRONCHOSCOPY WITH RADIAL ENDOBRONCHIAL ULTRASOUND: SHX6849

## 2021-09-29 HISTORY — PX: BRONCHIAL BRUSHINGS: SHX5108

## 2021-09-29 HISTORY — PX: BRONCHIAL NEEDLE ASPIRATION BIOPSY: SHX5106

## 2021-09-29 HISTORY — PX: VIDEO BRONCHOSCOPY WITH ENDOBRONCHIAL ULTRASOUND: SHX6177

## 2021-09-29 HISTORY — DX: Dependence on supplemental oxygen: Z99.81

## 2021-09-29 HISTORY — PX: BRONCHIAL BIOPSY: SHX5109

## 2021-09-29 LAB — BASIC METABOLIC PANEL
Anion gap: 10 (ref 5–15)
BUN: 7 mg/dL — ABNORMAL LOW (ref 8–23)
CO2: 27 mmol/L (ref 22–32)
Calcium: 9.7 mg/dL (ref 8.9–10.3)
Chloride: 98 mmol/L (ref 98–111)
Creatinine, Ser: 0.54 mg/dL (ref 0.44–1.00)
GFR, Estimated: >60 mL/min (ref >60)
Glucose, Bld: 119 mg/dL — ABNORMAL HIGH (ref 70–99)
Potassium: 3.9 mmol/L (ref 3.5–5.1)
Sodium: 135 mmol/L (ref 135–145)

## 2021-09-29 LAB — CBC
HCT: 39.8 % (ref 36.0–46.0)
Hemoglobin: 12.9 g/dL (ref 12.0–15.0)
MCH: 32.7 pg (ref 26.0–34.0)
MCHC: 32.4 g/dL (ref 30.0–36.0)
MCV: 101 fL — ABNORMAL HIGH (ref 80.0–100.0)
Platelets: 236 10*3/uL (ref 150–400)
RBC: 3.94 MIL/uL (ref 3.87–5.11)
RDW: 12.2 % (ref 11.5–15.5)
WBC: 7.7 10*3/uL (ref 4.0–10.5)
nRBC: 0 % (ref 0.0–0.2)

## 2021-09-29 LAB — SARS CORONAVIRUS 2 BY RT PCR: SARS Coronavirus 2 by RT PCR: NEGATIVE

## 2021-09-29 SURGERY — BRONCHOSCOPY, WITH BIOPSY USING ELECTROMAGNETIC NAVIGATION
Anesthesia: General | Laterality: Right

## 2021-09-29 MED ORDER — PROPOFOL 10 MG/ML IV BOLUS
INTRAVENOUS | Status: DC | PRN
  Administered 2021-09-29: 20 mg via INTRAVENOUS
  Administered 2021-09-29: 150 mg via INTRAVENOUS

## 2021-09-29 MED ORDER — ACETAMINOPHEN 10 MG/ML IV SOLN: 1000.0000 mg | Freq: Once | INTRAVENOUS | Status: DC | PRN

## 2021-09-29 MED ORDER — FENTANYL CITRATE (PF) 100 MCG/2ML IJ SOLN: 25.0000 ug | INTRAMUSCULAR | Status: DC | PRN

## 2021-09-29 MED ORDER — LIDOCAINE 2% (20 MG/ML) 5 ML SYRINGE
INTRAMUSCULAR | Status: DC | PRN
  Administered 2021-09-29: 60 mg via INTRAVENOUS

## 2021-09-29 MED ORDER — FENTANYL CITRATE (PF) 250 MCG/5ML IJ SOLN
INTRAMUSCULAR | Status: DC | PRN
  Administered 2021-09-29: 100 ug via INTRAVENOUS

## 2021-09-29 MED ORDER — CHLORHEXIDINE GLUCONATE 0.12 % MT SOLN
OROMUCOSAL | Status: AC
  Filled 2021-09-29: qty 15

## 2021-09-29 MED ORDER — ONDANSETRON HCL 4 MG/2ML IJ SOLN
INTRAMUSCULAR | Status: DC | PRN
  Administered 2021-09-29: 4 mg via INTRAVENOUS

## 2021-09-29 MED ORDER — SUGAMMADEX SODIUM 200 MG/2ML IV SOLN
INTRAVENOUS | Status: DC | PRN
  Administered 2021-09-29: 200 mg via INTRAVENOUS

## 2021-09-29 MED ORDER — PHENYLEPHRINE 80 MCG/ML (10ML) SYRINGE FOR IV PUSH (FOR BLOOD PRESSURE SUPPORT)
PREFILLED_SYRINGE | INTRAVENOUS | Status: DC | PRN
  Administered 2021-09-29 (×2): 80 ug via INTRAVENOUS

## 2021-09-29 MED ORDER — ROCURONIUM BROMIDE 10 MG/ML (PF) SYRINGE
PREFILLED_SYRINGE | INTRAVENOUS | Status: DC | PRN
  Administered 2021-09-29: 50 mg via INTRAVENOUS
  Administered 2021-09-29: 20 mg via INTRAVENOUS

## 2021-09-29 MED ORDER — PHENYLEPHRINE HCL-NACL 20-0.9 MG/250ML-% IV SOLN
INTRAVENOUS | Status: DC | PRN
  Administered 2021-09-29: 25 ug/min via INTRAVENOUS

## 2021-09-29 MED ORDER — DEXAMETHASONE SODIUM PHOSPHATE 10 MG/ML IJ SOLN
INTRAMUSCULAR | Status: DC | PRN
  Administered 2021-09-29: 10 mg via INTRAVENOUS

## 2021-09-29 MED ORDER — LACTATED RINGERS IV SOLN: INTRAVENOUS | Status: DC

## 2021-09-29 NOTE — Anesthesia Procedure Notes (Signed)
Procedure Name: Intubation Date/Time: 09/29/2021 11:50 AM  Performed by: Gaylene Brooks, CRNAPre-anesthesia Checklist: Patient identified, Emergency Drugs available, Suction available and Patient being monitored Patient Re-evaluated:Patient Re-evaluated prior to induction Oxygen Delivery Method: Circle System Utilized Preoxygenation: Pre-oxygenation with 100% oxygen Induction Type: IV induction Ventilation: Mask ventilation without difficulty and Oral airway inserted - appropriate to patient size Laryngoscope Size: Sabra Heck and 2 Grade View: Grade II Tube type: Oral Tube size: 8.5 mm Number of attempts: 2 Airway Equipment and Method: Stylet and Oral airway Placement Confirmation: ETT inserted through vocal cords under direct vision, positive ETCO2 and breath sounds checked- equal and bilateral Secured at: 21 cm Tube secured with: Tape Dental Injury: Teeth and Oropharynx as per pre-operative assessment  Difficulty Due To: Difficult Airway- due to reduced neck mobility, Difficult Airway- due to anterior larynx and Difficult Airway- due to dentition Comments: DL with Sabra Heck 2 by CRNA. Grade 2 view but ETT would not advance through cords.  DL with Sabra Heck 2 by MDA, Grade 2 view. AOI.

## 2021-09-29 NOTE — Op Note (Signed)
Video Bronchoscopy with Robotic Assisted Bronchoscopic Navigation and Endobronchial Ultrasound Procedure Note  Date of Operation: 09/29/2021   Pre-op Diagnosis: Right upper lobe pulmonary nodule, hilar adenopathy  Post-op Diagnosis: Same  Surgeon: Baltazar Apo  Assistants: None  Anesthesia: General endotracheal anesthesia  Operation: Flexible video fiberoptic bronchoscopy with robotic assistance and biopsies.  Estimated Blood Loss: Minimal  Complications: None  Indications and History: Joanne Rogers is a 76 y.o. female with history of slowly enlarging groundglass right upper lobe pulmonary nodule.  Most recent CT chest showed enlargement, PET scan on 09/17/2021 confirmed enlargement and showed a hypermetabolic right hilar node.  Recommendation made to achieve a tissue diagnosis via robotic assisted navigational bronchoscopy and endobronchial ultrasound with biopsies. The risks, benefits, complications, treatment options and expected outcomes were discussed with the patient.  The possibilities of pneumothorax, pneumonia, reaction to medication, pulmonary aspiration, perforation of a viscus, bleeding, failure to diagnose a condition and creating a complication requiring transfusion or operation were discussed with the patient who freely signed the consent.    Description of Procedure: The patient was seen in the Preoperative Area, was examined and was deemed appropriate to proceed.  The patient was taken to [facility] endoscopy room 3, identified as Joanne Rogers and the procedure verified as Flexible Video Fiberoptic Bronchoscopy.  A Time Out was held and the above information confirmed.   Prior to the date of the procedure a high-resolution CT scan of the chest was performed. Utilizing ION software program a virtual tracheobronchial tree was generated to allow the creation of distinct navigation pathways to the patient's parenchymal abnormalities. After being taken to the operating room general  anesthesia was initiated and the patient  was orally intubated. The video fiberoptic bronchoscope was introduced via the endotracheal tube and a general inspection was performed which showed normal right and left lung anatomy. Aspiration of the bilateral mainstems was completed to remove any remaining secretions. Robotic catheter inserted into patient's endotracheal tube.   Target #1 right upper lobe mixed density pulmonary nodule: The distinct navigation pathways prepared prior to this procedure were then utilized to navigate to patient's lesion identified on CT scan. The robotic catheter was secured into place and the vision probe was withdrawn.  Lesion location was approximated using fluoroscopy and radial endobronchial ultrasound for peripheral targeting. Under fluoroscopic guidance transbronchial needle brushings, transbronchial needle biopsies, and transbronchial forceps biopsies were performed to be sent for cytology and pathology.   The robotic scope was then withdrawn and the endobronchial ultrasound was used to identify and characterize the peritracheal, hilar and bronchial lymph nodes. Inspection showed an enlarged node at station 7.  There was a slightly enlarged node at station 10 R that was identified.  It was difficult to navigate the EBUS scope into the right upper lobe airway fully but the node was able to be identified. Using real-time ultrasound guidance Wang needle biopsies were take from Station 7 and 10 R nodes and were sent for cytology.   At the end of the procedure a general airway inspection was performed and there was no evidence of active bleeding. The bronchoscope was removed.  The patient tolerated the procedure well. There was no significant blood loss and there were no obvious complications. A post-procedural chest x-ray is pending.  Samples Target #1: 1. Transbronchial needle brushings from right upper lobe groundglass nodule 2. Transbronchial Wang needle biopsies from  right upper lobe groundglass nodule 3. Transbronchial forceps biopsies from right upper lobe groundglass nodule  EBUS samples:  1. Wang needle biopsies from station 7 node 2. Wang needle biopsies from station 10 R node   Plans:  The patient will be discharged from the PACU to home when recovered from anesthesia and after chest x-ray is reviewed. We will review the cytology, pathology and microbiology results with the patient when they become available. Outpatient followup will be with Dr. Lamonte Sakai.   Baltazar Apo, MD, PhD 09/29/2021, 12:56 PM [facility] [phone] or if no answer before 7:00PM call [phone] For any issues after 7:00PM please call eLink [phone]

## 2021-09-29 NOTE — Transfer of Care (Signed)
Immediate Anesthesia Transfer of Care Note  Patient: Joanne Rogers  Procedure(s) Performed: ROBOTIC ASSISTED NAVIGATIONAL BRONCHOSCOPY VIDEO BRONCHOSCOPY WITH ENDOBRONCHIAL ULTRASOUND (Right) VIDEO BRONCHOSCOPY WITH RADIAL ENDOBRONCHIAL ULTRASOUND BRONCHIAL BIOPSIES BRONCHIAL BRUSHINGS BRONCHIAL NEEDLE ASPIRATION BIOPSIES  Patient Location: PACU  Anesthesia Type:General  Level of Consciousness: awake, alert  and oriented  Airway & Oxygen Therapy: Patient Spontanous Breathing and Patient connected to face mask oxygen  Post-op Assessment: Report given to RN, Post -op Vital signs reviewed and stable and Patient moving all extremities X 4  Post vital signs: Reviewed and stable  Last Vitals:  Vitals Value Taken Time  BP 108/56 09/29/21 1301  Temp    Pulse 72 09/29/21 1304  Resp 17 09/29/21 1304  SpO2 97 % 09/29/21 1304  Vitals shown include unvalidated device data.  Last Pain:  Vitals:   09/29/21 0925  TempSrc:   PainSc: 0-No pain         Complications: No notable events documented.

## 2021-09-29 NOTE — Anesthesia Postprocedure Evaluation (Signed)
Anesthesia Post Note  Patient: Joanne Rogers  Procedure(s) Performed: ROBOTIC ASSISTED NAVIGATIONAL BRONCHOSCOPY VIDEO BRONCHOSCOPY WITH ENDOBRONCHIAL ULTRASOUND (Right) VIDEO BRONCHOSCOPY WITH RADIAL ENDOBRONCHIAL ULTRASOUND BRONCHIAL BIOPSIES BRONCHIAL BRUSHINGS BRONCHIAL NEEDLE ASPIRATION BIOPSIES     Patient location during evaluation: Phase II Anesthesia Type: General Level of consciousness: awake and alert, patient cooperative and oriented Pain management: pain level controlled Vital Signs Assessment: post-procedure vital signs reviewed and stable Respiratory status: spontaneous breathing, nonlabored ventilation and respiratory function stable Cardiovascular status: blood pressure returned to baseline and stable Postop Assessment: no apparent nausea or vomiting and able to ambulate Anesthetic complications: no   No notable events documented.  Last Vitals:  Vitals:   09/29/21 1345 09/29/21 1400  BP: (!) 116/50 117/69  Pulse: 69 70  Resp: 15 20  Temp:    SpO2: 91% (!) 89%    Last Pain:  Vitals:   09/29/21 1400  TempSrc:   PainSc: 0-No pain                 Leonila Speranza,E. Marquese Burkland

## 2021-09-29 NOTE — Discharge Instructions (Addendum)
Flexible Bronchoscopy, Care After This sheet gives you information about how to care for yourself after your test. Your doctor may also give you more specific instructions. If you have problems or questions, contact your doctor. Follow these instructions at home: Eating and drinking When your numbness is gone and your cough and gag reflexes have come back, you may: Eat only bland foods. Slowly drink liquids. The day after the test, go back to your normal diet. Driving Do not drive for 24 hours if you were given a medicine to help you relax (sedative). Do not drive or use heavy machinery while taking prescription pain medicine. General instructions  Take over-the-counter and prescription medicines only as told by your doctor. Return to your normal activities as told. Ask what activities are safe for you. Do not use any products that have nicotine or tobacco in them. This includes cigarettes and e-cigarettes. If you need help quitting, ask your doctor. Keep all follow-up visits as told by your doctor. This is important. It is very important if you had a tissue sample (biopsy) taken. Get help right away if: You have shortness of breath that gets worse. You get light-headed. You feel like you are going to pass out (faint). You have chest pain. You cough up: More than a little blood. More blood than before. Summary Do not eat or drink anything (not even water) for 2 hours after your test, or until your numbing medicine wears off. Do not use cigarettes. Do not use e-cigarettes. Get help right away if you have chest pain.  Please call our office for any questions or concerns.  [phone].  This information is not intended to replace advice given to you by your health care provider. Make sure you discuss any questions you have with your health care provider. Document Released: 10/26/2008 Document Revised: 12/11/2016 Document Reviewed: 01/17/2016 Elsevier Patient Education  2020 [employer].

## 2021-09-29 NOTE — Interval H&P Note (Signed)
History and Physical Interval Note:  7/79/3903 0:09 AM  Joanne Rogers  has presented today for surgery, with the diagnosis of RIGHT UPPER LOBE NODULE MASS.  The various methods of treatment have been discussed with the patient and family. After consideration of risks, benefits and other options for treatment, the patient has consented to  Procedure(s): ROBOTIC ASSISTED NAVIGATIONAL BRONCHOSCOPY (N/A) VIDEO BRONCHOSCOPY WITH ENDOBRONCHIAL ULTRASOUND (Right) as a surgical intervention.  The patient's history has been reviewed, patient examined, no change in status, stable for surgery.  I have reviewed the patient's chart and labs.  Questions were answered to the patient's satisfaction.     Collene Gobble

## 2021-09-30 LAB — NOVEL CORONAVIRUS, NAA: SARS-CoV-2, NAA: NOT DETECTED

## 2021-09-30 LAB — SPECIMEN STATUS REPORT

## 2021-10-01 LAB — CYTOLOGY - NON PAP

## 2021-10-03 ENCOUNTER — Telehealth: Payer: Self-pay | Admitting: Emergency Medicine

## 2021-10-03 DIAGNOSIS — C3491 Malignant neoplasm of unspecified part of right bronchus or lung: Secondary | ICD-10-CM

## 2021-10-03 NOTE — Telephone Encounter (Signed)
Called patient and she would like results from her bronch procedure. Procedure was done 9/18.  Please advise sir

## 2021-10-03 NOTE — Telephone Encounter (Signed)
Spoke with the patient about bx results. TBBx show adenoCA. The EBUS nodes were negative - ? Whether the 4R sampling was adequate (positive on PET).  I will refer her to see Dr Julien Nordmann.

## 2021-10-06 ENCOUNTER — Telehealth: Payer: Self-pay | Admitting: Physician Assistant

## 2021-10-06 NOTE — Telephone Encounter (Signed)
Pt called back, confirmed her appt on 9/28. She is aware to arrive 15 mins prior to appt.

## 2021-10-06 NOTE — Telephone Encounter (Signed)
Scheduled appt per 9/22 referral. Called pt, no answer. Left msg with appt date/time. Requested for pt to call back to confirm appt.

## 2021-10-07 ENCOUNTER — Other Ambulatory Visit: Payer: Self-pay

## 2021-10-07 DIAGNOSIS — C3491 Malignant neoplasm of unspecified part of right bronchus or lung: Secondary | ICD-10-CM

## 2021-10-08 NOTE — Progress Notes (Unsigned)
[facility] Telephone:([phone]) [phone]   Fax:([phone]  CONSULT NOTE  REFERRING PHYSICIAN:  REASON FOR CONSULTATION:  ***  HPI Joanne Rogers is a 76 y.o. female with medical history significant for COPD, hyperlipidemia, congestive heart failure, and coronary artery disease is referred to the clinic for newly diagnosed non-small cell lung cancer, adenocarcinoma.  The patient has been followed by pulmonary medicine for a mixed density right upper lobe pulmonary nodule that was initially seen on coronary CT scan of the chest in November 2021.  This is being followed by serial imaging.  In June 2023, the hazy groundglass right upper lobe pulmonary nodule was slightly more prominent than her scan 1 year prior.  The patient Dr. Lamonte Sakai had a discussion about possible bronchoscopy but after a risk benefit discussion opted to proceed with a repeat CT scan and PET scan in 3 months to further stratify the risk.  On 09/18/2021, the patient had a follow-up visit with Dr. Lamonte Sakai.  The groundglass pulmonary nodule shows very faint hypermetabolism consistent with adenocarcinoma.  Unfortunately, there is now an adjacent right hilar lymph node that is hypermetabolic on PET scan.  Dr. Lamonte Sakai talked the patient about options and she was agreeable to bronchoscopy.   This was performed on 9/18. The final pathology ([id number]) of the RUL FNA was consistent with adenocarcinoma. The FNA of the station 10 and station 7 lymph nodes were negative, the station 7 lymph node showed granulomatous inflammation.   Overall the patient is feeling *** today.        HPI  Past Medical History:  Diagnosis Date   Arthritis    back, hands, neck   COPD (chronic obstructive pulmonary disease) (HCC)    Coronary artery disease 11/28/2019   CCTA 11/28/19: Mild non-obstructive CAD, myocardial bridge mid LAD (cardiologist Dr. Standley Dakins, 08/19/21)   DDD (degenerative disc disease), lumbar    Dyspnea    uses  oxygen at home   H/O bronchitis    allergy related   Hypercholesterolemia    Oxygen dependent    Palpitations    "fluttering"   Pneumonia    hx of    Past Surgical History:  Procedure Laterality Date   BRONCHIAL BIOPSY  09/29/2021   Procedure: BRONCHIAL BIOPSIES;  Surgeon: Collene Gobble, MD;  Location: [facility];  Service: Pulmonary;;   BRONCHIAL BRUSHINGS  09/29/2021   Procedure: BRONCHIAL BRUSHINGS;  Surgeon: Collene Gobble, MD;  Location: [facility];  Service: Pulmonary;;   BRONCHIAL NEEDLE ASPIRATION BIOPSY  09/29/2021   Procedure: BRONCHIAL NEEDLE ASPIRATION BIOPSIES;  Surgeon: Collene Gobble, MD;  Location: [facility];  Service: Pulmonary;;   CARDIOVASCULAR STRESS TEST  11/16/2005   EF 70%, NO ISCHEMIA   CARPAL TUNNEL RELEASE Right ~1988   DILATION AND CURETTAGE OF UTERUS     x3: x2 for miscarrages and x1 for polyp   HEMI-MICRODISCECTOMY LUMBAR LAMINECTOMY LEVEL 1 Left 07/20/2014   Procedure: centeral decompression,L4-L5, HEMI-LAMINECTOMY MICRODISCECTOMY L4 - L5 ON THE LEFT  LEVEL 1, forimatomy 4-5 root on the left;  Surgeon: Latanya Maudlin, MD;  Location: [facility];  Service: Orthopedics;  Laterality: Left;   US ECHOCARDIOGRAPHY  11/19/2005   EF 55-60%   VIDEO BRONCHOSCOPY WITH ENDOBRONCHIAL ULTRASOUND Right 09/29/2021   Procedure: VIDEO BRONCHOSCOPY WITH ENDOBRONCHIAL ULTRASOUND;  Surgeon: Collene Gobble, MD;  Location: [facility];  Service: Pulmonary;  Laterality: Right;   VIDEO BRONCHOSCOPY WITH RADIAL ENDOBRONCHIAL ULTRASOUND  09/29/2021   Procedure: VIDEO BRONCHOSCOPY WITH  RADIAL ENDOBRONCHIAL ULTRASOUND;  Surgeon: Collene Gobble, MD;  Location: [facility];  Service: Pulmonary;;    Family History  Problem Relation Age of Onset   Breast cancer Mother 62   Emphysema Maternal Grandfather     Social History Social History   Tobacco Use   Smoking status: Former    Packs/day: 0.50    Years: 25.00    Total pack years: 12.50    Types: Cigarettes    Quit date:  05/16/2005    Years since quitting: 16.4   Smokeless tobacco: Never  Vaping Use   Vaping Use: Never used  Substance Use Topics   Alcohol use: Yes    Alcohol/week: 6.0 standard drinks of alcohol    Types: 6 Standard drinks or equivalent per week    Comment: social beer   Drug use: No    Allergies  Allergen Reactions   Codeine Hives   Amoxicillin Nausea And Vomiting   Latex Hives and Rash   Levofloxacin Nausea Only   Statins Other (See Comments)    Muscle aches    Current Outpatient Medications  Medication Sig Dispense Refill   acetaminophen (TYLENOL) 500 MG tablet Take 1,000 mg by mouth every 6 (six) hours as needed for moderate pain.     albuterol (VENTOLIN HFA) 108 (90 Base) MCG/ACT inhaler Inhale 1-2 puffs into the lungs every 4 (four) hours as needed for wheezing or shortness of breath. 1 Inhaler 5   ascorbic acid (VITAMIN C) 500 MG tablet Take 500 mg by mouth daily.     aspirin 81 MG tablet Take 81 mg by mouth at bedtime.     azelastine (ASTELIN) 0.1 % nasal spray Place 1 spray into both nostrils as needed for rhinitis.     beta carotene w/minerals (OCUVITE) tablet Take 1 tablet by mouth at bedtime.     bisoprolol (ZEBETA) 5 MG tablet Take 1 tablet (5 mg total) by mouth daily. 90 tablet 3   cetirizine (ZYRTEC) 10 MG tablet Take 10 mg by mouth at bedtime.     cholecalciferol (VITAMIN D3) 25 MCG (1000 UNIT) tablet Take 1,000 Units by mouth daily.     Cyanocobalamin (B-12) 2500 MCG TABS Take 2,500 mcg by mouth daily.     famotidine (PEPCID) 20 MG tablet One at bedtime 30 tablet 11   ibuprofen (ADVIL) 200 MG tablet Take 400 mg by mouth every 6 (six) hours as needed for moderate pain.     ipratropium-albuterol (DUONEB) 0.5-2.5 (3) MG/3ML SOLN Take 3 mLs by nebulization every 4 (four) hours. Dx: J44.9 360 mL 1   Multiple Minerals-Vitamins (CAL MAG ZINC +D3) TABS Take 1-2 tablets by mouth See admin instructions. Take 1 tablet in the morning and 2 tablets in the evening     Multiple  Vitamins-Minerals (EMERGEN-C VITAMIN C) PACK Take 1 packet by mouth daily.     OXYGEN 2lpm with sleep     Polyethyl Glycol-Propyl Glycol (SYSTANE OP) Place 1 drop into both eyes daily as needed (dry eyes).     Respiratory Therapy Supplies (FLUTTER) DEVI 1 Device by Does not apply route as needed. 1 each 0   SYMBICORT 160-4.5 MCG/ACT inhaler Inhale 2 puffs into the lungs in the morning and at bedtime. 10.2 g 11   Tiotropium Bromide Monohydrate (SPIRIVA RESPIMAT) 2.5 MCG/ACT AERS Inhale 2 puffs into the lungs daily. 4 g 0   valACYclovir (VALTREX) 1000 MG tablet Take 500 mg by mouth every other day.      No  current facility-administered medications for this visit.    REVIEW OF SYSTEMS:   Review of Systems  Constitutional: Negative for appetite change, chills, fatigue, fever and unexpected weight change.  HENT:   Negative for mouth sores, nosebleeds, sore throat and trouble swallowing.   Eyes: Negative for eye problems and icterus.  Respiratory: Negative for cough, hemoptysis, shortness of breath and wheezing.   Cardiovascular: Negative for chest pain and leg swelling.  Gastrointestinal: Negative for abdominal pain, constipation, diarrhea, nausea and vomiting.  Genitourinary: Negative for bladder incontinence, difficulty urinating, dysuria, frequency and hematuria.   Musculoskeletal: Negative for back pain, gait problem, neck pain and neck stiffness.  Skin: Negative for itching and rash.  Neurological: Negative for dizziness, extremity weakness, gait problem, headaches, light-headedness and seizures.  Hematological: Negative for adenopathy. Does not bruise/bleed easily.  Psychiatric/Behavioral: Negative for confusion, depression and sleep disturbance. The patient is not nervous/anxious.     PHYSICAL EXAMINATION:  There were no vitals taken for this visit.  ECOG PERFORMANCE STATUS: {CHL ONC ECOG [id number]  Physical Exam  Constitutional: Oriented to person, place, and time and  well-developed, well-nourished, and in no distress. No distress.  HENT:  Head: Normocephalic and atraumatic.  Mouth/Throat: Oropharynx is clear and moist. No oropharyngeal exudate.  Eyes: Conjunctivae are normal. Right eye exhibits no discharge. Left eye exhibits no discharge. No scleral icterus.  Neck: Normal range of motion. Neck supple.  Cardiovascular: Normal rate, regular rhythm, normal heart sounds and intact distal pulses.   Pulmonary/Chest: Effort normal and breath sounds normal. No respiratory distress. No wheezes. No rales.  Abdominal: Soft. Bowel sounds are normal. Exhibits no distension and no mass. There is no tenderness.  Musculoskeletal: Normal range of motion. Exhibits no edema.  Lymphadenopathy:    No cervical adenopathy.  Neurological: Alert and oriented to person, place, and time. Exhibits normal muscle tone. Gait normal. Coordination normal.  Skin: Skin is warm and dry. No rash noted. Not diaphoretic. No erythema. No pallor.  Psychiatric: Mood, memory and judgment normal.  Vitals reviewed.  LABORATORY DATA: Lab Results  Component Value Date   WBC 7.7 09/29/2021   HGB 12.9 09/29/2021   HCT 39.8 09/29/2021   MCV 101.0 (H) 09/29/2021   PLT 236 09/29/2021      Chemistry      Component Value Date/Time   NA 135 09/29/2021 0855   NA 137 05/08/2020 0909   K 3.9 09/29/2021 0855   CL 98 09/29/2021 0855   CO2 27 09/29/2021 0855   BUN 7 (L) 09/29/2021 0855   BUN 8 05/08/2020 0909   CREATININE 0.54 09/29/2021 0855      Component Value Date/Time   CALCIUM 9.7 09/29/2021 0855   ALKPHOS 85 07/17/2014 1555   AST 47 (H) 07/17/2014 1555   ALT 19 05/08/2020 0909   BILITOT 0.7 07/17/2014 1555       RADIOGRAPHIC STUDIES: DG Chest Port 1 View  Result Date: 09/29/2021 CLINICAL DATA:  Status post bronchoscopy EXAM: PORTABLE CHEST 1 VIEW COMPARISON:  08/31/2019, 09/17/2021 FINDINGS: The heart size and mediastinal contours are within normal limits. Aortic atherosclerosis.  No focal airspace consolidation, pleural effusion, or pneumothorax. The visualized skeletal structures are unremarkable. IMPRESSION: No pneumothorax status post bronchoscopy. Electronically Signed   By: Davina Poke D.O.   On: 09/29/2021 13:40   DG C-ARM BRONCHOSCOPY  Result Date: 09/29/2021 C-ARM BRONCHOSCOPY: Fluoroscopy was utilized by the requesting physician.  No radiographic interpretation.   NM PET Image Initial (PI) Skull Base To Thigh  Result  Date: 09/18/2021 CLINICAL DATA:  Initial treatment strategy for right upper lobe pulmonary lesion. EXAM: NUCLEAR MEDICINE PET SKULL BASE TO THIGH TECHNIQUE: 7.77 mCi F-18 FDG was injected intravenously. Full-ring PET imaging was performed from the skull base to thigh after the radiotracer. CT data was obtained and used for attenuation correction and anatomic localization. Fasting blood glucose: 110 mg/dl COMPARISON:  Multiple prior chest CTs. FINDINGS: Mediastinal blood pool activity: SUV max 2.53 Liver activity: SUV max NA NECK: No hypermetabolic lymph nodes in the neck. Incidental CT findings: None. CHEST: The right upper lobe ground-glass lesion demonstrates low level hypermetabolism with SUV max of 1.73. Adjacent right hilar node has an SUV max of 8.35. No hypermetabolic breast masses, supraclavicular or axillary adenopathy. Incidental CT findings: Stable vascular disease. ABDOMEN/PELVIS: No abnormal hypermetabolic activity within the liver, pancreas, adrenal glands, or spleen. No hypermetabolic lymph nodes in the abdomen or pelvis. Incidental CT findings: Moderate atherosclerotic calcification involving the aorta and branch vessels. Benign left hepatic lobe cyst. SKELETON: No findings for osseous metastatic disease. Incidental CT findings: None. IMPRESSION: 1. Right upper lobe ground-glass lesion demonstrates low level hypermetabolism and is most consistent with a primary lung neoplasm (adenocarcinoma). 9 mm right hilar node is hypermetabolic and  consistent with metastatic adenopathy. 2. No findings for abdominal/pelvic metastatic disease or osseous metastatic disease. Electronically Signed   By: Marijo Sanes M.D.   On: 09/18/2021 11:00   NM PET SUPER D CT  Result Date: 09/18/2021 CLINICAL DATA:  Pre EMB biopsy. EXAM: CT CHEST WITHOUT CONTRAST TECHNIQUE: Multidetector CT imaging of the chest was performed using thin slice collimation for electromagnetic bronchoscopy planning purposes, without intravenous contrast. RADIATION DOSE REDUCTION: This exam was performed according to the departmental dose-optimization program which includes automated exposure control, adjustment of the mA and/or kV according to patient size and/or use of iterative reconstruction technique. COMPARISON:  PET-CT, same date. Prior CT scan 06/12/2021 FINDINGS: Cardiovascular: The heart is normal in size. No pericardial effusion. Stable aortic and coronary artery calcifications. Mediastinum/Nodes: Small scattered mediastinal and hilar lymph nodes are stable. 9 mm right hilar node on image 28/2 facial hypermetabolism on the PET-CT and is worrisome for neoplastic adenopathy. No mass or overt adenopathy. The esophagus is grossly normal. Lungs/Pleura: Persistent ground-glass lesion in the right upper lobe measuring approximately 2.7 x 2.1 cm. Small area of slight increased density in the mid to lower portion of the lesion. This shows mild hypermetabolism on the PET-CT Stable underlying emphysematous changes. No other pulmonary lesions are identified. Lingular and right middle lobe scarring changes are stable. No pleural effusions or pleural nodules. Upper Abdomen: No significant upper abdominal findings. Stable aortic and branch vessel calcifications. Stable left hepatic lobe cyst. Musculoskeletal: No breast masses, supraclavicular or axillary adenopathy. The bony thorax is intact. IMPRESSION: 1. 2.7 x 2.1 cm ground-glass lesion in the right upper lobe most consistent with adenocarcinoma.  2. 9 mm right hilar node demonstrates hypermetabolism on the PET-CT and is worrisome for neoplastic (metastatic) adenopathy. 3. Stable emphysematous changes and pulmonary scarring. 4. No findings for upper abdominal metastatic disease. Aortic Atherosclerosis (ICD10-I70.0) and Emphysema (ICD10-J43.9). Electronically Signed   By: Marijo Sanes M.D.   On: 09/18/2021 10:55    ASSESSMENT: This is a very pleasant 76 year old Caucasian female diagnosed with stage *** (T***, N***, M0) non-small cell lung cancer, adenocarcinoma.  The patient presented with right upper lobe groundglass lesion and hypermetabolic right hilar lymph node.  She was diagnosed in September 2023. ***Request molecular's  The patient was  seen by Dr. Julien Nordmann today.  Dr. Julien Nordmann had a lengthy discussion with the patient today about her current condition and recommended treatment option.  We will need a brain MRI to complete the staging work-up.  Dr. Julien Nordmann recommends ***chemoradiation if he thinks lymph node is positive?  Or radiation?  Prescriptions, chemo and, side effects, follow-up  The patient voices understanding of current disease status and treatment options and is in agreement with the current care plan.  All questions were answered. The patient knows to call the clinic with any problems, questions or concerns. We can certainly see the patient much sooner if necessary.  Thank you so much for allowing me to participate in the care of [place]. I will continue to follow up the patient with you and assist in her care.  I spent {CHL ONC TIME VISIT - YHOOI:[id number]} counseling the patient face to face. The total time spent in the appointment was {CHL ONC TIME VISIT - ORVIF:[id number]}.  Disclaimer: This note was dictated with voice recognition software. Similar sounding words can inadvertently be transcribed and may not be corrected upon review.   Eppie Barhorst L Reice Bienvenue October 08, 2021, 1:27 PM

## 2021-10-09 ENCOUNTER — Encounter: Payer: Self-pay | Admitting: Physician Assistant

## 2021-10-09 ENCOUNTER — Inpatient Hospital Stay: Payer: Medicare HMO | Attending: Physician Assistant | Admitting: Physician Assistant

## 2021-10-09 ENCOUNTER — Inpatient Hospital Stay: Payer: Medicare HMO

## 2021-10-09 ENCOUNTER — Other Ambulatory Visit: Payer: Self-pay | Admitting: Physician Assistant

## 2021-10-09 ENCOUNTER — Other Ambulatory Visit: Payer: Self-pay

## 2021-10-09 VITALS — BP 148/67 | HR 71 | Temp 97.9°F | Resp 16 | Wt 156.4 lb

## 2021-10-09 DIAGNOSIS — C3411 Malignant neoplasm of upper lobe, right bronchus or lung: Secondary | ICD-10-CM | POA: Insufficient documentation

## 2021-10-09 DIAGNOSIS — C3491 Malignant neoplasm of unspecified part of right bronchus or lung: Secondary | ICD-10-CM

## 2021-10-09 DIAGNOSIS — Z9981 Dependence on supplemental oxygen: Secondary | ICD-10-CM | POA: Insufficient documentation

## 2021-10-09 DIAGNOSIS — I2510 Atherosclerotic heart disease of native coronary artery without angina pectoris: Secondary | ICD-10-CM | POA: Diagnosis not present

## 2021-10-09 DIAGNOSIS — Z7951 Long term (current) use of inhaled steroids: Secondary | ICD-10-CM | POA: Insufficient documentation

## 2021-10-09 DIAGNOSIS — I4891 Unspecified atrial fibrillation: Secondary | ICD-10-CM | POA: Insufficient documentation

## 2021-10-09 DIAGNOSIS — Z87891 Personal history of nicotine dependence: Secondary | ICD-10-CM | POA: Insufficient documentation

## 2021-10-09 DIAGNOSIS — E7800 Pure hypercholesterolemia, unspecified: Secondary | ICD-10-CM | POA: Insufficient documentation

## 2021-10-09 DIAGNOSIS — Z803 Family history of malignant neoplasm of breast: Secondary | ICD-10-CM | POA: Diagnosis not present

## 2021-10-09 DIAGNOSIS — Z79624 Long term (current) use of inhibitors of nucleotide synthesis: Secondary | ICD-10-CM | POA: Diagnosis not present

## 2021-10-09 DIAGNOSIS — Z79899 Other long term (current) drug therapy: Secondary | ICD-10-CM | POA: Diagnosis not present

## 2021-10-09 DIAGNOSIS — C3490 Malignant neoplasm of unspecified part of unspecified bronchus or lung: Secondary | ICD-10-CM

## 2021-10-09 DIAGNOSIS — Z7982 Long term (current) use of aspirin: Secondary | ICD-10-CM | POA: Insufficient documentation

## 2021-10-09 LAB — CMP (CANCER CENTER ONLY)
ALT: 23 U/L (ref 0–44)
AST: 22 U/L (ref 15–41)
Albumin: 4.2 g/dL (ref 3.5–5.0)
Alkaline Phosphatase: 77 U/L (ref 38–126)
Anion gap: 6 (ref 5–15)
BUN: 12 mg/dL (ref 8–23)
CO2: 34 mmol/L — ABNORMAL HIGH (ref 22–32)
Calcium: 9.9 mg/dL (ref 8.9–10.3)
Chloride: 96 mmol/L — ABNORMAL LOW (ref 98–111)
Creatinine: 0.5 mg/dL (ref 0.44–1.00)
GFR, Estimated: >60 mL/min (ref >60)
Glucose, Bld: 100 mg/dL — ABNORMAL HIGH (ref 70–99)
Potassium: 4.5 mmol/L (ref 3.5–5.1)
Sodium: 136 mmol/L (ref 135–145)
Total Bilirubin: 0.4 mg/dL (ref 0.3–1.2)
Total Protein: 7.2 g/dL (ref 6.5–8.1)

## 2021-10-09 LAB — CBC WITH DIFFERENTIAL (CANCER CENTER ONLY)
Abs Immature Granulocytes: 0.03 10*3/uL (ref 0.00–0.07)
Basophils Absolute: 0 10*3/uL (ref 0.0–0.1)
Basophils Relative: 0 %
Eosinophils Absolute: 0.3 10*3/uL (ref 0.0–0.5)
Eosinophils Relative: 3 %
HCT: 39.3 % (ref 36.0–46.0)
Hemoglobin: 13.2 g/dL (ref 12.0–15.0)
Immature Granulocytes: 0 %
Lymphocytes Relative: 16 %
Lymphs Abs: 1.4 10*3/uL (ref 0.7–4.0)
MCH: 32.8 pg (ref 26.0–34.0)
MCHC: 33.6 g/dL (ref 30.0–36.0)
MCV: 97.5 fL (ref 80.0–100.0)
Monocytes Absolute: 0.9 10*3/uL (ref 0.1–1.0)
Monocytes Relative: 10 %
Neutro Abs: 6.3 10*3/uL (ref 1.7–7.7)
Neutrophils Relative %: 71 %
Platelet Count: 244 10*3/uL (ref 150–400)
RBC: 4.03 MIL/uL (ref 3.87–5.11)
RDW: 11.9 % (ref 11.5–15.5)
WBC Count: 8.9 10*3/uL (ref 4.0–10.5)
nRBC: 0 % (ref 0.0–0.2)

## 2021-10-09 NOTE — Patient Instructions (Addendum)
-  There are two main categories of lung cancer, they are named based on the size of the cancer cell. One is called Non-Small cell lung cancer. The other type is Small Cell Lung Cancer -The sample (biopsy) that Dr. Lamonte Sakai took of your tumor was consistent with a subtype of Non-small cell lung cancer called adenocarcinoma. This is the most common type -We discussed your case at the conference this morning.  If the patient is able to undergo surgery (based on the assessment with the surgeon, other health concerns, and overall health) surgery is the preferred/best option for you to just remove the cancer from the lung. If after you see the surgeon and if they do not feel that you are a candidate for surgery, then we would recommend with chemo/radiation. I have the logistics written below.  -The treatment with chemo/radiation is two chemotherapy drugs called Carboplatin and Paclitaxel (also called Taxol). The chemotherapy is a low dose. It helps the radiation kill the tumor better. Therefore, we do not expect you to lose your hair. You may have mild nausea/vomiting but we would send you a prescription for nausea medication.   -Before you would start treatment, you would attend a Chemotherapy Education Class. This involves having you sit down with one of our nurse educators. She would discuss with you one-on-one more details about your treatment as well as general information about resources here at the [facility] would be given every week (on Mondays) for about 6 weeks or so (as long as you are also receiving radiation). Radiation is daily Monday-Friday for 6 weeks. We will check your labs (blood work) once a week . Dr. Julien Nordmann or I would see you every other treatment just to make sure that you are doing well and that everything is on track. -We will get a CT scan about 3 weeks after you complete your treatment -If after you are evaluated by the surgeon and he decides that you are not a candidate for  surgery, please contact our office so we can make a follow-up appointment to discuss the next steps and make arrangements.  If Dr. Roxan Hockey does recommend surgery, then we would see you back a few weeks after surgery to discuss next steps based on what they find in the surgical specimen (they will measure the tumor and check lymph nodes.

## 2021-10-09 NOTE — Progress Notes (Signed)
CT surgery requesting new PFTs. I have placed an order. Will send mychart message to the patient to notify her of this.

## 2021-10-10 ENCOUNTER — Encounter: Payer: Self-pay | Admitting: *Deleted

## 2021-10-10 ENCOUNTER — Other Ambulatory Visit: Payer: Self-pay | Admitting: *Deleted

## 2021-10-10 NOTE — Progress Notes (Signed)
The proposed treatment discussed in conference is for discussion purpose only and is not a binding recommendation.  The patients have not been physically examined, or presented with their treatment options.  Therefore, final treatment plans cannot be decided.  

## 2021-10-10 NOTE — Progress Notes (Signed)
PFTs ordered in preparation for surgical evaluation.  PFTs scheduled for 10/13/2021.

## 2021-10-13 ENCOUNTER — Ambulatory Visit (HOSPITAL_COMMUNITY)
Admission: RE | Admit: 2021-10-13 | Discharge: 2021-10-13 | Disposition: A | Discharge status: Home / Self Care | Payer: Medicare HMO | Source: Ambulatory Visit | Attending: Physician Assistant | Admitting: Physician Assistant

## 2021-10-13 DIAGNOSIS — C3411 Malignant neoplasm of upper lobe, right bronchus or lung: Secondary | ICD-10-CM | POA: Insufficient documentation

## 2021-10-13 DIAGNOSIS — C3490 Malignant neoplasm of unspecified part of unspecified bronchus or lung: Secondary | ICD-10-CM | POA: Insufficient documentation

## 2021-10-13 LAB — PULMONARY FUNCTION TEST
DL/VA % pred: 100 %
DL/VA: 4.03 ml/min/mmHg/L
DLCO cor % pred: 73 %
DLCO cor: 14.93 ml/min/mmHg
DLCO unc % pred: 73 %
DLCO unc: 14.84 ml/min/mmHg
FEF 25-75 Post: 0.37 L/sec
FEF 25-75 Pre: 0.35 L/sec
FEF2575-%Change-Post: 3 %
FEF2575-%Pred-Post: 22 %
FEF2575-%Pred-Pre: 21 %
FEV1-%Change-Post: -6 %
FEV1-%Pred-Post: 39 %
FEV1-%Pred-Pre: 42 %
FEV1-Post: 0.87 L
FEV1-Pre: 0.93 L
FEV1FVC-%Change-Post: -10 %
FEV1FVC-%Pred-Pre: 76 %
FEV6-%Change-Post: 3 %
FEV6-%Pred-Post: 57 %
FEV6-%Pred-Pre: 55 %
FEV6-Post: 1.6 L
FEV6-Pre: 1.55 L
FEV6FVC-%Change-Post: -2 %
FEV6FVC-%Pred-Post: 98 %
FEV6FVC-%Pred-Pre: 100 %
FVC-%Change-Post: 5 %
FVC-%Pred-Post: 58 %
FVC-%Pred-Pre: 55 %
FVC-Post: 1.72 L
FVC-Pre: 1.63 L
Post FEV1/FVC ratio: 51 %
Post FEV6/FVC ratio: 93 %
Pre FEV1/FVC ratio: 57 %
Pre FEV6/FVC Ratio: 95 %
RV % pred: 148 %
RV: 3.65 L
TLC % pred: 102 %
TLC: 5.47 L

## 2021-10-13 MED ORDER — ALBUTEROL SULFATE (2.5 MG/3ML) 0.083% IN NEBU
2.5000 mg | INHALATION_SOLUTION | Freq: Once | RESPIRATORY_TRACT | Status: AC
  Administered 2021-10-13: 2.5 mg via RESPIRATORY_TRACT

## 2021-10-14 ENCOUNTER — Ambulatory Visit (HOSPITAL_COMMUNITY)
Admission: RE | Admit: 2021-10-14 | Discharge: 2021-10-14 | Disposition: A | Discharge status: Home / Self Care | Payer: Medicare HMO | Source: Ambulatory Visit | Attending: Physician Assistant | Admitting: Physician Assistant

## 2021-10-14 DIAGNOSIS — C3490 Malignant neoplasm of unspecified part of unspecified bronchus or lung: Secondary | ICD-10-CM | POA: Diagnosis not present

## 2021-10-14 DIAGNOSIS — I611 Nontraumatic intracerebral hemorrhage in hemisphere, cortical: Secondary | ICD-10-CM | POA: Diagnosis not present

## 2021-10-14 DIAGNOSIS — C3411 Malignant neoplasm of upper lobe, right bronchus or lung: Secondary | ICD-10-CM | POA: Diagnosis not present

## 2021-10-14 MED ORDER — GADOPICLENOL 0.5 MMOL/ML IV SOLN
7.0000 mL | Freq: Once | INTRAVENOUS | Status: AC | PRN
  Administered 2021-10-14: 7 mL via INTRAVENOUS

## 2021-10-15 ENCOUNTER — Institutional Professional Consult (permissible substitution): Payer: Medicare HMO | Admitting: Thoracic Surgery (Cardiothoracic Vascular Surgery)

## 2021-10-15 ENCOUNTER — Encounter: Payer: Self-pay | Admitting: Thoracic Surgery (Cardiothoracic Vascular Surgery)

## 2021-10-15 VITALS — BP 160/67 | HR 77 | Resp 18 | Ht 66.0 in | Wt 157.0 lb

## 2021-10-15 DIAGNOSIS — C3491 Malignant neoplasm of unspecified part of right bronchus or lung: Secondary | ICD-10-CM

## 2021-10-15 NOTE — Progress Notes (Unsigned)
[facility] OFFICE PROGRESS NOTE  [name], MD [place] [id number]  DIAGNOSIS: Likely Stage IIb (T1c, No/N1, M0) non-small cell lung cancer, adenocarcinoma.  The patient presented with right upper lobe groundglass lesion and hypermetabolic right hilar lymph node.  She was diagnosed in September 2023.   PRIOR THERAPY: None  CURRENT THERAPY: Concurrent chemoradiation with carboplatin for an AUC of 2 and paclitaxel 45 mg/m2. First dose expected on 10/27/21.   INTERVAL HISTORY: Joanne Rogers 76 y.o. female returns to the clinic today for a follow up visit accompanied by her daughter. The patient was first seen in the clinic on 10/09/21. Her case was discussed at the multidisciplinary conference last week. She saw Dr. Roxan Hockey for surgical evaluation. Unfortunately, she is not a candidate for resection due to the central location and the nodule crossing the fissure into the middle lobe. To achieve adequate margins, she likely would need a bilobectomy and her pulmonary reserve is not adequate to tolerate a major resection.   Therefore, the patient is back to discuss the next steps in her care. She denies any major changes in her health since last being seen.  Due to her COPD, she does have baseline dyspnea on exertion.  She does use 3 inhalers. She has a follow up with Dr. Lamonte Sakai next week.  Denies any fever, chills, night sweats, or unexplained weight loss.  Denies any major cough, unless early in the morning secondary to postnasal drainage.  Denies any chest discomfort.  She had a mild/minimal blood-tinged mucus/sputum after her bronchoscopy which has subsided.  Denies any nausea, vomiting, diarrhea, or constipation denies any headache or visual changes.   MEDICAL HISTORY: Past Medical History:  Diagnosis Date   Arthritis    back, hands, neck   COPD (chronic obstructive pulmonary disease) ([place])    Coronary artery disease 11/28/2019   CCTA  11/28/19: Mild non-obstructive CAD, myocardial bridge mid LAD (cardiologist Dr. Standley Dakins, 08/19/21)   DDD (degenerative disc disease), lumbar    Dyspnea    uses oxygen at home   H/O bronchitis    allergy related   Hypercholesterolemia    Oxygen dependent    Palpitations    "fluttering"   Pneumonia    hx of    ALLERGIES:  is allergic to codeine, amoxicillin, latex, levofloxacin, and statins.  MEDICATIONS:  Current Outpatient Medications  Medication Sig Dispense Refill   acetaminophen (TYLENOL) 500 MG tablet Take 1,000 mg by mouth every 6 (six) hours as needed for moderate pain.     albuterol (VENTOLIN HFA) 108 (90 Base) MCG/ACT inhaler Inhale 1-2 puffs into the lungs every 4 (four) hours as needed for wheezing or shortness of breath. 1 Inhaler 5   ascorbic acid (VITAMIN C) 500 MG tablet Take 500 mg by mouth daily.     aspirin 81 MG tablet Take 81 mg by mouth at bedtime.     azelastine (ASTELIN) 0.1 % nasal spray Place 1 spray into both nostrils as needed for rhinitis.     beta carotene w/minerals (OCUVITE) tablet Take 1 tablet by mouth at bedtime.     bisoprolol (ZEBETA) 5 MG tablet Take 1 tablet (5 mg total) by mouth daily. 90 tablet 3   cetirizine (ZYRTEC) 10 MG tablet Take 10 mg by mouth at bedtime.     cholecalciferol (VITAMIN D3) 25 MCG (1000 UNIT) tablet Take 1,000 Units by mouth daily.     Cyanocobalamin (B-12) 2500 MCG TABS Take 2,500  mcg by mouth daily.     famotidine (PEPCID) 20 MG tablet One at bedtime 30 tablet 11   ibuprofen (ADVIL) 200 MG tablet Take 400 mg by mouth every 6 (six) hours as needed for moderate pain.     ipratropium-albuterol (DUONEB) 0.5-2.5 (3) MG/3ML SOLN Take 3 mLs by nebulization every 4 (four) hours. Dx: J44.9 360 mL 1   Multiple Minerals-Vitamins (CAL MAG ZINC +D3) TABS Take 1-2 tablets by mouth See admin instructions. Take 1 tablet in the morning and 2 tablets in the evening     Multiple Vitamins-Minerals (EMERGEN-C VITAMIN C) PACK Take 1 packet by  mouth daily.     OXYGEN 2lpm with sleep     Polyethyl Glycol-Propyl Glycol (SYSTANE OP) Place 1 drop into both eyes daily as needed (dry eyes).     prochlorperazine (COMPAZINE) 10 MG tablet Take 1 tablet (10 mg total) by mouth every 6 (six) hours as needed. 30 tablet 2   Respiratory Therapy Supplies (FLUTTER) DEVI 1 Device by Does not apply route as needed. 1 each 0   SYMBICORT 160-4.5 MCG/ACT inhaler Inhale 2 puffs into the lungs in the morning and at bedtime. 10.2 g 11   Tiotropium Bromide Monohydrate (SPIRIVA RESPIMAT) 2.5 MCG/ACT AERS Inhale 2 puffs into the lungs daily. 4 g 0   valACYclovir (VALTREX) 1000 MG tablet Take 500 mg by mouth every other day.      No current facility-administered medications for this visit.    SURGICAL HISTORY:  Past Surgical History:  Procedure Laterality Date   BRONCHIAL BIOPSY  09/29/2021   Procedure: BRONCHIAL BIOPSIES;  Surgeon: Collene Gobble, MD;  Location: [facility] ENDOSCOPY;  Service: Pulmonary;;   BRONCHIAL BRUSHINGS  09/29/2021   Procedure: BRONCHIAL BRUSHINGS;  Surgeon: Collene Gobble, MD;  Location: [facility] ENDOSCOPY;  Service: Pulmonary;;   BRONCHIAL NEEDLE ASPIRATION BIOPSY  09/29/2021   Procedure: BRONCHIAL NEEDLE ASPIRATION BIOPSIES;  Surgeon: Collene Gobble, MD;  Location: [facility] ENDOSCOPY;  Service: Pulmonary;;   CARDIOVASCULAR STRESS TEST  11/16/2005   EF 70%, NO ISCHEMIA   CARPAL TUNNEL RELEASE Right ~1988   DILATION AND CURETTAGE OF UTERUS     x3: x2 for miscarrages and x1 for polyp   HEMI-MICRODISCECTOMY LUMBAR LAMINECTOMY LEVEL 1 Left 07/20/2014   Procedure: centeral decompression,L4-L5, HEMI-LAMINECTOMY MICRODISCECTOMY L4 - L5 ON THE LEFT  LEVEL 1, forimatomy 4-5 root on the left;  Surgeon: Latanya Maudlin, MD;  Location: [facility] ORS;  Service: Orthopedics;  Laterality: Left;   US ECHOCARDIOGRAPHY  11/19/2005   EF 55-60%   VIDEO BRONCHOSCOPY WITH ENDOBRONCHIAL ULTRASOUND Right 09/29/2021   Procedure: VIDEO BRONCHOSCOPY WITH ENDOBRONCHIAL ULTRASOUND;   Surgeon: Collene Gobble, MD;  Location: [facility] ENDOSCOPY;  Service: Pulmonary;  Laterality: Right;   VIDEO BRONCHOSCOPY WITH RADIAL ENDOBRONCHIAL ULTRASOUND  09/29/2021   Procedure: VIDEO BRONCHOSCOPY WITH RADIAL ENDOBRONCHIAL ULTRASOUND;  Surgeon: Collene Gobble, MD;  Location: [facility] ENDOSCOPY;  Service: Pulmonary;;    REVIEW OF SYSTEMS:   Review of Systems  Constitutional: Negative for appetite change, chills, fatigue, fever and unexpected weight change.  HENT: Negative for mouth sores, nosebleeds, sore throat and trouble swallowing.   Eyes: Negative for eye problems and icterus.  Respiratory: Positive for baseline dyspnea on exertion.  Negative for cough, hemoptysis, and wheezing.   Cardiovascular: Negative for chest pain and leg swelling.  Gastrointestinal: Negative for abdominal pain, constipation, diarrhea, nausea and vomiting.  Genitourinary: Negative for bladder incontinence, difficulty urinating, dysuria, frequency and hematuria.   Musculoskeletal: Negative for back  pain, gait problem, neck pain and neck stiffness.  Skin: Negative for itching and rash.  Neurological: Negative for dizziness, extremity weakness, gait problem, headaches, light-headedness and seizures.  Hematological: Negative for adenopathy. Does not bruise/bleed easily.  Psychiatric/Behavioral: Negative for confusion, depression and sleep disturbance. The patient is not nervous/anxious.     PHYSICAL EXAMINATION:  Blood pressure (!) 146/63, pulse 68, temperature 98.2 F (36.8 C), temperature source Oral, resp. rate 18, weight 156 lb 1 oz (70.8 kg), SpO2 96 %.  ECOG PERFORMANCE STATUS: 0-1  Physical Exam  Constitutional: Oriented to person, place, and time and well-developed, well-nourished, and in no distress. HENT:  Head: Normocephalic and atraumatic.  Mouth/Throat: Oropharynx is clear and moist. No oropharyngeal exudate.  Eyes: Conjunctivae are normal. Right eye exhibits no discharge. Left eye exhibits no discharge.  No scleral icterus.  Neck: Normal range of motion. Neck supple.  Cardiovascular: Normal rate, regular rhythm, normal heart sounds and intact distal pulses.   Pulmonary/Chest: Effort normal and breath sounds normal. No respiratory distress. No wheezes. No rales.  Abdominal: Soft. Bowel sounds are normal. Exhibits no distension and no mass. There is no tenderness.  Musculoskeletal: Normal range of motion. Exhibits no edema.  Lymphadenopathy:    No cervical adenopathy.  Neurological: Alert and oriented to person, place, and time. Exhibits normal muscle tone. Gait normal. Coordination normal.  Skin: Skin is warm and dry. No rash noted. Not diaphoretic. No erythema. No pallor.  Psychiatric: Mood, memory and judgment normal.  Vitals reviewed.  LABORATORY DATA: Lab Results  Component Value Date   WBC 8.9 10/09/2021   HGB 13.2 10/09/2021   HCT 39.3 10/09/2021   MCV 97.5 10/09/2021   PLT 244 10/09/2021      Chemistry      Component Value Date/Time   NA 136 10/09/2021 1313   NA 137 05/08/2020 0909   K 4.5 10/09/2021 1313   CL 96 (L) 10/09/2021 1313   CO2 34 (H) 10/09/2021 1313   BUN 12 10/09/2021 1313   BUN 8 05/08/2020 0909   CREATININE 0.50 10/09/2021 1313      Component Value Date/Time   CALCIUM 9.9 10/09/2021 1313   ALKPHOS 77 10/09/2021 1313   AST 22 10/09/2021 1313   ALT 23 10/09/2021 1313   BILITOT 0.4 10/09/2021 1313       RADIOGRAPHIC STUDIES:  DG Chest Port 1 View  Result Date: 09/29/2021 CLINICAL DATA:  Status post bronchoscopy EXAM: PORTABLE CHEST 1 VIEW COMPARISON:  08/31/2019, 09/17/2021 FINDINGS: The heart size and mediastinal contours are within normal limits. Aortic atherosclerosis. No focal airspace consolidation, pleural effusion, or pneumothorax. The visualized skeletal structures are unremarkable. IMPRESSION: No pneumothorax status post bronchoscopy. Electronically Signed   By: Davina Poke D.O.   On: 09/29/2021 13:40   DG C-ARM BRONCHOSCOPY  Result  Date: 09/29/2021 C-ARM BRONCHOSCOPY: Fluoroscopy was utilized by the requesting physician.  No radiographic interpretation.   NM PET Image Initial (PI) Skull Base To Thigh  Result Date: 09/18/2021 CLINICAL DATA:  Initial treatment strategy for right upper lobe pulmonary lesion. EXAM: NUCLEAR MEDICINE PET SKULL BASE TO THIGH TECHNIQUE: 7.77 mCi F-18 FDG was injected intravenously. Full-ring PET imaging was performed from the skull base to thigh after the radiotracer. CT data was obtained and used for attenuation correction and anatomic localization. Fasting blood glucose: 110 mg/dl COMPARISON:  Multiple prior chest CTs. FINDINGS: Mediastinal blood pool activity: SUV max 2.53 Liver activity: SUV max NA NECK: No hypermetabolic lymph nodes in the neck. Incidental CT  findings: None. CHEST: The right upper lobe ground-glass lesion demonstrates low level hypermetabolism with SUV max of 1.73. Adjacent right hilar node has an SUV max of 8.35. No hypermetabolic breast masses, supraclavicular or axillary adenopathy. Incidental CT findings: Stable vascular disease. ABDOMEN/PELVIS: No abnormal hypermetabolic activity within the liver, pancreas, adrenal glands, or spleen. No hypermetabolic lymph nodes in the abdomen or pelvis. Incidental CT findings: Moderate atherosclerotic calcification involving the aorta and branch vessels. Benign left hepatic lobe cyst. SKELETON: No findings for osseous metastatic disease. Incidental CT findings: None. IMPRESSION: 1. Right upper lobe ground-glass lesion demonstrates low level hypermetabolism and is most consistent with a primary lung neoplasm (adenocarcinoma). 9 mm right hilar node is hypermetabolic and consistent with metastatic adenopathy. 2. No findings for abdominal/pelvic metastatic disease or osseous metastatic disease. Electronically Signed   By: Marijo Sanes M.D.   On: 09/18/2021 11:00   NM PET SUPER D CT  Result Date: 09/18/2021 CLINICAL DATA:  Pre EMB biopsy. EXAM: CT CHEST  WITHOUT CONTRAST TECHNIQUE: Multidetector CT imaging of the chest was performed using thin slice collimation for electromagnetic bronchoscopy planning purposes, without intravenous contrast. RADIATION DOSE REDUCTION: This exam was performed according to the departmental dose-optimization program which includes automated exposure control, adjustment of the mA and/or kV according to patient size and/or use of iterative reconstruction technique. COMPARISON:  PET-CT, same date. Prior CT scan 06/12/2021 FINDINGS: Cardiovascular: The heart is normal in size. No pericardial effusion. Stable aortic and coronary artery calcifications. Mediastinum/Nodes: Small scattered mediastinal and hilar lymph nodes are stable. 9 mm right hilar node on image 28/2 facial hypermetabolism on the PET-CT and is worrisome for neoplastic adenopathy. No mass or overt adenopathy. The esophagus is grossly normal. Lungs/Pleura: Persistent ground-glass lesion in the right upper lobe measuring approximately 2.7 x 2.1 cm. Small area of slight increased density in the mid to lower portion of the lesion. This shows mild hypermetabolism on the PET-CT Stable underlying emphysematous changes. No other pulmonary lesions are identified. Lingular and right middle lobe scarring changes are stable. No pleural effusions or pleural nodules. Upper Abdomen: No significant upper abdominal findings. Stable aortic and branch vessel calcifications. Stable left hepatic lobe cyst. Musculoskeletal: No breast masses, supraclavicular or axillary adenopathy. The bony thorax is intact. IMPRESSION: 1. 2.7 x 2.1 cm ground-glass lesion in the right upper lobe most consistent with adenocarcinoma. 2. 9 mm right hilar node demonstrates hypermetabolism on the PET-CT and is worrisome for neoplastic (metastatic) adenopathy. 3. Stable emphysematous changes and pulmonary scarring. 4. No findings for upper abdominal metastatic disease. Aortic Atherosclerosis (ICD10-I70.0) and Emphysema  (ICD10-J43.9). Electronically Signed   By: Marijo Sanes M.D.   On: 09/18/2021 10:55     ASSESSMENT/PLAN:  This is a very pleasant 76 year old Caucasian female diagnosed with likely stage IIb (T1c, N0/N1, M0) non-small cell lung cancer, adenocarcinoma.  The patient presented with right upper lobe groundglass lesion and hypermetabolic right hilar lymph node.  She was diagnosed in September 2023.   We will request foundation 1 and PD-L1 testing on the patient's biopsy.  The patient was evaluated by Dr. Roxan Hockey. She is not a surgical candidate as she would likely require a bilobectomy and does not have the pulmonary reserve for this.   Therefore, she was seen with Dr. Julien Nordmann to discuss the next steps. She also had a brain MRI which is still pending at this time. Dr. Julien Nordmann reviewed the scan and did not see any obvious metastatic brain lesions, of course, we will wait for the radiologist  over read.   Dr. Julien Nordmann had a lengthly discussion with the patient today about her current condition and treatment options. Dr. Julien Nordmann recommends concurrent chemoradiation with carboplatin for an AUC of 2 and paclitaxel 45 mg/m2 weekly for 6 weeks with concurrent radiation.  The patient is interested in proceeding with systemic chemotherapy.  She is expected to start her first dose of this treatment on 10/27/21.  I have placed a referral to radiation oncology.   We discussed the adverse side effects of treatment including but not limited to possible alopecia, fatigue, myelosuppression, nausea and vomiting, peripheral neuropathy, liver or renal dysfunction as well as immunotherapy mediated adverse effects.   I will arrange for the patient to have a chemoeducation class prior to receiving her first cycle of chemotherapy.   I sent a prescriptions for Compazine 10 mg every 6 hours as needed for nausea.   The patient will follow-up in 3 weeks before cycle #2.   The patient was advised to call immediately if  she has any concerning symptoms in the interval. The patient voices understanding of current disease status and treatment options and is in agreement with the current care plan. All questions were answered. The patient knows to call the clinic with any problems, questions or concerns. We can certainly see the patient much sooner if necessary    Orders Placed This Encounter  Procedures   CBC with Differential ([facility] Only)    Standing Status:   Future    Standing Expiration Date:   10/17/2022   CMP ([facility] only)    Standing Status:   Future    Standing Expiration Date:   10/17/2022   Ambulatory referral to Radiation Oncology    Referral Priority:   Routine    Referral Type:   Consultation    Referral Reason:   Specialty Services Required    Requested Specialty:   Radiation Oncology    Number of Visits Requested:   [place], PA-C 10/16/21  ADDENDUM: Hematology/Oncology Attending: I had a face-to-face encounter with the patient today.  I reviewed her records, lab, scan and recommended her care plan.  This is a very pleasant 76 years old white female with unresectable stage IIb (T1c, N1, M0) non-small cell lung cancer presented with right upper lobe groundglass lesion in addition to hypermetabolic right hilar lymph node diagnosed in September 2023. The patient was seen recently by Dr. Roxan Hockey for evaluation of surgical resection but unfortunately she is not great candidate for surgical resection. The patient was referred to [place] today for evaluation and recommendation regarding treatment of her condition. I had a lengthy discussion with the patient and her daughter today about her current condition and treatment options. I recommended for the patient a course of concurrent chemoradiation with weekly carboplatin for AUC of 2 and paclitaxel 45 Mg/M2 for 6-7 weeks. I discussed with the patient the adverse effect of this treatment including but not  limited to alopecia, myelosuppression, nausea and vomiting, peripheral neuropathy, liver or renal dysfunction. She also had MRI of the brain performed 2 days ago but unfortunately the final report is still pending.  I quickly reviewed the scan images and they did not see any visible concerning abnormality but will wait for the final report for confirmation. The patient will have a chemotherapy education class before the first dose of her treatment. She is expected to start the first dose of this treatment on 10/27/2021. I will see the patient  for follow-up visit 1 week after her first cycle of treatment for evaluation and management of any adverse effect. The patient was advised to call immediately if she has any other concerning symptoms in the interval.  The total time spent in the appointment was 30 minutes. Disclaimer: This note was dictated with voice recognition software. Similar sounding words can inadvertently be transcribed and may be missed upon review. Eilleen Kempf, MD

## 2021-10-15 NOTE — Progress Notes (Signed)
PCP is [name], MD Referring Provider is Heilingoetter, Aram Candela*  Chief Complaint  Patient presents with   Lung Cancer    Chest CT 6/1, PET 9/6, Bronch 9/18, PFTs 10/2    HPI: Joanne Rogers is sent for consultation regarding an adenocarcinoma of the right upper lobe.  Joanne Rogers is a 76 year old woman with a past history significant for remote tobacco abuse, alpha 1 antitrypsin, COPD, mild CAD, hyperlipidemia, degenerative disc disease, arthritis, and history of pneumonia.  She smoked about a half a pack a day for 25 years.  She quit in 2007.  She has been followed by Dr. Lamonte Sakai for multiple groundglass lung nodules.  Recently right upper lobe nodule had increased in size.  A PET/CT showed some faint hypermetabolic activity and also showed a hypermetabolic right hilar lymph node.  Dr. Lamonte Sakai performed robotic bronchoscopy and endobronchial ultrasound on 09/29/2021.  Samples from the nodule showed adenocarcinoma.  The hilar node was difficult to access.  The sample submitted showed no evidence of malignancy.  Samples from the level 7 node showed granulomas.  She remains fairly active.  She works in her yard.  Does get tight sensation in her chest and short of breath with exertion, but manages her routine activities.  She gets very short of breath if she tries to walk up stairs.  No change in appetite or weight loss.  No unusual headaches or visual changes.  She does use home oxygen at night when she sleeps.  Occasionally she will use it during the day.   Past Medical History:  Diagnosis Date   Arthritis    back, hands, neck   COPD (chronic obstructive pulmonary disease) (HCC)    Coronary artery disease 11/28/2019   CCTA 11/28/19: Mild non-obstructive CAD, myocardial bridge mid LAD (cardiologist Dr. Standley Dakins, 08/19/21)   DDD (degenerative disc disease), lumbar    Dyspnea    uses oxygen at home   H/O bronchitis    allergy related   Hypercholesterolemia    Oxygen dependent     Palpitations    "fluttering"   Pneumonia    hx of    Past Surgical History:  Procedure Laterality Date   BRONCHIAL BIOPSY  09/29/2021   Procedure: BRONCHIAL BIOPSIES;  Surgeon: Collene Gobble, MD;  Location: [facility];  Service: Pulmonary;;   BRONCHIAL BRUSHINGS  09/29/2021   Procedure: BRONCHIAL BRUSHINGS;  Surgeon: Collene Gobble, MD;  Location: [place] [facility];  Service: Pulmonary;;   BRONCHIAL NEEDLE ASPIRATION BIOPSY  09/29/2021   Procedure: BRONCHIAL NEEDLE ASPIRATION BIOPSIES;  Surgeon: Collene Gobble, MD;  Location: [facility];  Service: Pulmonary;;   CARDIOVASCULAR STRESS TEST  11/16/2005   EF 70%, NO ISCHEMIA   CARPAL TUNNEL RELEASE Right ~1988   DILATION AND CURETTAGE OF UTERUS     x3: x2 for miscarrages and x1 for polyp   HEMI-MICRODISCECTOMY LUMBAR LAMINECTOMY LEVEL 1 Left 07/20/2014   Procedure: centeral decompression,L4-L5, HEMI-LAMINECTOMY MICRODISCECTOMY L4 - L5 ON THE LEFT  LEVEL 1, forimatomy 4-5 root on the left;  Surgeon: Latanya Maudlin, MD;  Location: [facility];  Service: Orthopedics;  Laterality: Left;   US ECHOCARDIOGRAPHY  11/19/2005   EF 55-60%   VIDEO BRONCHOSCOPY WITH ENDOBRONCHIAL ULTRASOUND Right 09/29/2021   Procedure: VIDEO BRONCHOSCOPY WITH ENDOBRONCHIAL ULTRASOUND;  Surgeon: Collene Gobble, MD;  Location: [facility];  Service: Pulmonary;  Laterality: Right;   VIDEO BRONCHOSCOPY WITH RADIAL ENDOBRONCHIAL ULTRASOUND  09/29/2021   Procedure: VIDEO BRONCHOSCOPY WITH RADIAL ENDOBRONCHIAL ULTRASOUND;  Surgeon: Lamonte Sakai,  Rose Fillers, MD;  Location: [facility];  Service: Pulmonary;;    Family History  Problem Relation Age of Onset   Breast cancer Mother 8   Emphysema Maternal Grandfather     Social History Social History   Tobacco Use   Smoking status: Former    Packs/day: 0.50    Years: 25.00    Total pack years: 12.50    Types: Cigarettes    Quit date: 05/16/2005    Years since quitting: 16.4   Smokeless tobacco: Never  Vaping Use   Vaping Use: Never  used  Substance Use Topics   Alcohol use: Yes    Alcohol/week: 6.0 standard drinks of alcohol    Types: 6 Standard drinks or equivalent per week    Comment: social beer   Drug use: No    Current Outpatient Medications  Medication Sig Dispense Refill   acetaminophen (TYLENOL) 500 MG tablet Take 1,000 mg by mouth every 6 (six) hours as needed for moderate pain.     albuterol (VENTOLIN HFA) 108 (90 Base) MCG/ACT inhaler Inhale 1-2 puffs into the lungs every 4 (four) hours as needed for wheezing or shortness of breath. 1 Inhaler 5   ascorbic acid (VITAMIN C) 500 MG tablet Take 500 mg by mouth daily.     aspirin 81 MG tablet Take 81 mg by mouth at bedtime.     azelastine (ASTELIN) 0.1 % nasal spray Place 1 spray into both nostrils as needed for rhinitis.     beta carotene w/minerals (OCUVITE) tablet Take 1 tablet by mouth at bedtime.     bisoprolol (ZEBETA) 5 MG tablet Take 1 tablet (5 mg total) by mouth daily. 90 tablet 3   cetirizine (ZYRTEC) 10 MG tablet Take 10 mg by mouth at bedtime.     cholecalciferol (VITAMIN D3) 25 MCG (1000 UNIT) tablet Take 1,000 Units by mouth daily.     Cyanocobalamin (B-12) 2500 MCG TABS Take 2,500 mcg by mouth daily.     famotidine (PEPCID) 20 MG tablet One at bedtime 30 tablet 11   ibuprofen (ADVIL) 200 MG tablet Take 400 mg by mouth every 6 (six) hours as needed for moderate pain.     ipratropium-albuterol (DUONEB) 0.5-2.5 (3) MG/3ML SOLN Take 3 mLs by nebulization every 4 (four) hours. Dx: J44.9 360 mL 1   Multiple Minerals-Vitamins (CAL MAG ZINC +D3) TABS Take 1-2 tablets by mouth See admin instructions. Take 1 tablet in the morning and 2 tablets in the evening     Multiple Vitamins-Minerals (EMERGEN-C VITAMIN C) PACK Take 1 packet by mouth daily.     OXYGEN 2lpm with sleep     Polyethyl Glycol-Propyl Glycol (SYSTANE OP) Place 1 drop into both eyes daily as needed (dry eyes).     Respiratory Therapy Supplies (FLUTTER) DEVI 1 Device by Does not apply route as  needed. 1 each 0   SYMBICORT 160-4.5 MCG/ACT inhaler Inhale 2 puffs into the lungs in the morning and at bedtime. 10.2 g 11   Tiotropium Bromide Monohydrate (SPIRIVA RESPIMAT) 2.5 MCG/ACT AERS Inhale 2 puffs into the lungs daily. 4 g 0   valACYclovir (VALTREX) 1000 MG tablet Take 500 mg by mouth every other day.      No current facility-administered medications for this visit.    Allergies  Allergen Reactions   Codeine Hives   Amoxicillin Nausea And Vomiting   Latex Hives and Rash   Levofloxacin Nausea Only   Statins Other (See Comments)    Muscle aches  Review of Systems  Constitutional:  Negative for activity change, fatigue and unexpected weight change.  Eyes:  Negative for visual disturbance.  Respiratory:  Positive for chest tightness, shortness of breath and wheezing.   Cardiovascular:  Negative for palpitations and leg swelling.  Gastrointestinal:  Positive for abdominal pain (reflux).  Genitourinary:  Negative for difficulty urinating and dyspareunia.  Musculoskeletal:  Positive for arthralgias and joint swelling.  Neurological:  Negative for syncope and weakness.  All other systems reviewed and are negative.   BP (!) 160/67 (BP Location: Left Arm, Patient Position: Sitting)   Pulse 77   Resp 18   Ht 5\' 6"  (1.676 m)   Wt 157 lb (71.2 kg)   SpO2 95% Comment: RA  BMI 25.34 kg/m  Physical Exam Constitutional:      General: She is not in acute distress.    Appearance: Normal appearance.  HENT:     Head: Normocephalic and atraumatic.  Eyes:     General: No scleral icterus.    Extraocular Movements: Extraocular movements intact.  Neck:     Vascular: No carotid bruit.  Cardiovascular:     Rate and Rhythm: Normal rate and regular rhythm.     Heart sounds: Normal heart sounds. No murmur heard. Pulmonary:     Effort: Pulmonary effort is normal. No respiratory distress.     Breath sounds: Wheezing (Faint end expiratory) present. No rales.     Comments: Diminished  breath sounds bilaterally Skin:    General: Skin is warm and dry.  Neurological:     General: No focal deficit present.     Mental Status: She is alert and oriented to person, place, and time.     Cranial Nerves: No cranial nerve deficit.     Motor: No weakness.    Diagnostic Tests: NUCLEAR MEDICINE PET SKULL BASE TO THIGH   TECHNIQUE: 7.77 mCi F-18 FDG was injected intravenously. Full-ring PET imaging was performed from the skull base to thigh after the radiotracer. CT data was obtained and used for attenuation correction and anatomic localization.   Fasting blood glucose: 110 mg/dl   COMPARISON:  Multiple prior chest CTs.   FINDINGS: Mediastinal blood pool activity: SUV max 2.53   Liver activity: SUV max NA   NECK: No hypermetabolic lymph nodes in the neck.   Incidental CT findings: None.   CHEST: The right upper lobe ground-glass lesion demonstrates low level hypermetabolism with SUV max of 1.73. Adjacent right hilar node has an SUV max of 8.35.   No hypermetabolic breast masses, supraclavicular or axillary adenopathy.   Incidental CT findings: Stable vascular disease.   ABDOMEN/PELVIS: No abnormal hypermetabolic activity within the liver, pancreas, adrenal glands, or spleen. No hypermetabolic lymph nodes in the abdomen or pelvis.   Incidental CT findings: Moderate atherosclerotic calcification involving the aorta and branch vessels. Benign left hepatic lobe cyst.   SKELETON: No findings for osseous metastatic disease.   Incidental CT findings: None.   IMPRESSION: 1. Right upper lobe ground-glass lesion demonstrates low level hypermetabolism and is most consistent with a primary lung neoplasm (adenocarcinoma). 9 mm right hilar node is hypermetabolic and consistent with metastatic adenopathy. 2. No findings for abdominal/pelvic metastatic disease or osseous metastatic disease.     Electronically Signed   By: Marijo Sanes M.D.   On: 09/18/2021 11:00    Pulmonary function testing 10/13/2021 FVC  1.63 (55%) FEV1  0.93 (42%) FEV1  0.87 (39%) postbronchodilator DLCO 14.93 (73%)   Impression:  Joanne Rogers is  a 76 year old woman with a past history significant for remote tobacco abuse, alpha 1 antitrypsin, COPD, mild CAD, hyperlipidemia, degenerative disc disease, arthritis, and history of pneumonia.  She has been followed for multiple groundglass lung nodules.  Recently the largest nodule in the right upper lobe has increased in size, now measuring 2.6 x 1.8 cm.  Biopsy showed adenocarcinoma.  Stage is difficult with the hypermetabolic right hilar node.  EBUS did not show any tumor cells in the right hilar node but that area is can be very difficult to access.  Of note she did have some granulomas in a subcarinal node.  Clinical stage is either IA (T1,N0),  or IIB (T1,N1).  She does have multiple other groundglass opacities, but those have been relatively stable over time.  Reviewing her CT the nodule is central.  Her minor fissure is incomplete, but the nodule very clearly crosses what would be the fissure into the middle lobe.  In order to achieve adequate margins she likely would need a bilobectomy.  I do not see any way to do a wedge or segmental resection on this nodule that would result in a sufficient margin.  Her functional status is good based on her reports.  But, her pulmonary reserve is not adequate to tolerate a major resection.  I discussed with her and her daughter that chemoradiation would be a better option for her.  She has a follow-up appointment coming up with Dr. Julien Nordmann.  Plan: Follow-up with Dr. Julien Nordmann for management of the adenocarcinoma Follow-up with Dr. Lamonte Sakai for management of COPD I will be happy to see Mrs. Abercrombie back anytime if I can be of any assistance with her care.  Melrose Nakayama, MD [facility] [phone]

## 2021-10-16 ENCOUNTER — Encounter: Payer: Self-pay | Admitting: Physician Assistant

## 2021-10-16 ENCOUNTER — Inpatient Hospital Stay: Payer: Medicare HMO | Admitting: Physician Assistant

## 2021-10-16 ENCOUNTER — Telehealth: Payer: Self-pay | Admitting: Radiation Oncology

## 2021-10-16 ENCOUNTER — Other Ambulatory Visit: Payer: Self-pay

## 2021-10-16 ENCOUNTER — Telehealth: Payer: Self-pay | Admitting: Internal Medicine

## 2021-10-16 VITALS — BP 146/63 | HR 68 | Temp 98.2°F | Resp 18 | Wt 156.1 lb

## 2021-10-16 DIAGNOSIS — J449 Chronic obstructive pulmonary disease, unspecified: Secondary | ICD-10-CM | POA: Insufficient documentation

## 2021-10-16 DIAGNOSIS — K7689 Other specified diseases of liver: Secondary | ICD-10-CM | POA: Insufficient documentation

## 2021-10-16 DIAGNOSIS — C3411 Malignant neoplasm of upper lobe, right bronchus or lung: Secondary | ICD-10-CM | POA: Insufficient documentation

## 2021-10-16 DIAGNOSIS — Z5111 Encounter for antineoplastic chemotherapy: Secondary | ICD-10-CM | POA: Insufficient documentation

## 2021-10-16 DIAGNOSIS — M129 Arthropathy, unspecified: Secondary | ICD-10-CM | POA: Insufficient documentation

## 2021-10-16 DIAGNOSIS — Z7951 Long term (current) use of inhaled steroids: Secondary | ICD-10-CM | POA: Insufficient documentation

## 2021-10-16 DIAGNOSIS — E7800 Pure hypercholesterolemia, unspecified: Secondary | ICD-10-CM | POA: Insufficient documentation

## 2021-10-16 DIAGNOSIS — R21 Rash and other nonspecific skin eruption: Secondary | ICD-10-CM | POA: Insufficient documentation

## 2021-10-16 DIAGNOSIS — Z9981 Dependence on supplemental oxygen: Secondary | ICD-10-CM | POA: Insufficient documentation

## 2021-10-16 DIAGNOSIS — I2510 Atherosclerotic heart disease of native coronary artery without angina pectoris: Secondary | ICD-10-CM | POA: Insufficient documentation

## 2021-10-16 DIAGNOSIS — Z510 Encounter for antineoplastic radiation therapy: Secondary | ICD-10-CM | POA: Insufficient documentation

## 2021-10-16 DIAGNOSIS — Z7982 Long term (current) use of aspirin: Secondary | ICD-10-CM | POA: Insufficient documentation

## 2021-10-16 DIAGNOSIS — Z79899 Other long term (current) drug therapy: Secondary | ICD-10-CM | POA: Insufficient documentation

## 2021-10-16 DIAGNOSIS — Z79624 Long term (current) use of inhibitors of nucleotide synthesis: Secondary | ICD-10-CM | POA: Insufficient documentation

## 2021-10-16 DIAGNOSIS — I700 Atherosclerosis of aorta: Secondary | ICD-10-CM | POA: Insufficient documentation

## 2021-10-16 DIAGNOSIS — I809 Phlebitis and thrombophlebitis of unspecified site: Secondary | ICD-10-CM | POA: Insufficient documentation

## 2021-10-16 DIAGNOSIS — C3491 Malignant neoplasm of unspecified part of right bronchus or lung: Secondary | ICD-10-CM | POA: Insufficient documentation

## 2021-10-16 DIAGNOSIS — Z7189 Other specified counseling: Secondary | ICD-10-CM

## 2021-10-16 MED ORDER — PROCHLORPERAZINE MALEATE 10 MG PO TABS: 10.0000 mg | ORAL_TABLET | Freq: Four times a day (QID) | ORAL | 2 refills | Status: DC | PRN

## 2021-10-16 NOTE — Telephone Encounter (Signed)
Scheduled per 10/05 los and work-queue, patient received updated calender of upcoming October and November appointments.

## 2021-10-16 NOTE — Progress Notes (Signed)
I have called [facility] and spoken with Varney Biles to add Foundation One and PDL1 with dx code C34.90, stage IIB. She has added this as requested.

## 2021-10-16 NOTE — Progress Notes (Signed)
START ON PATHWAY REGIMEN - Non-Small Cell Lung     A cycle is every 7 days, concurrent with RT:     Paclitaxel      Carboplatin   **Always confirm dose/schedule in your pharmacy ordering system**  Patient Characteristics: Preoperative or Nonsurgical Candidate (Clinical Staging), Stage II, Nonsurgical Candidate Therapeutic Status: Preoperative or Nonsurgical Candidate (Clinical Staging) AJCC T Category: cT1c AJCC N Category: cN1 AJCC M Category: cM0 AJCC 8 Stage Grouping: IIB Intent of Therapy: Curative Intent, Discussed with Patient

## 2021-10-16 NOTE — Telephone Encounter (Signed)
Called patient to schedule a consultation w. Dr. Lisbeth Renshaw. No answer, LVM for a return call.

## 2021-10-16 NOTE — Patient Instructions (Signed)
-  There are two main categories of lung cancer, they are named based on the size of the cancer cell. One is called Non-Small cell lung cancer. The other type is Small Cell Lung Cancer -The sample (biopsy) that they took of your tumor was consistent with a subtype of Non-small cell lung cancer called Adenocarcinoma.  -We covered a lot of important information at your appointment today regarding what the treatment plan is moving forward. Here are the the main points that were discussed at your office visit with [place] today:  -The treatment that you will receive consists of two chemotherapy drugs called Carboplatin and Paclitaxel (also called Taxol) -We are planning on starting your treatment next week on 10/27/21  but before your start your treatment, I would like you to attend a Chemotherapy Education Class. This involves having you sit down with one of our nurse educators. She will discuss with you one-on-one more details about your treatment as well as general information about resources here at the [facility] treatment will be given every week for about 6 weeks or so (as long as you are also receiving radiation). We will check your labs (blood work) once a week . Dr. Julien Nordmann or I will see you every other treatment just to make sure that you are doing well and that everything is on track. -We will get a CT scan about 3 weeks after you complete your treatment  Medications:  -Compazine was sent to your pharmacy. This medication is for nausea. You may take this every 6 hours as needed if you feel nausous.   Referrals -I will place the referral to radiation oncology to meet with you to discuss starting radiation treatment. Please be on the lookout for a phone call from them to schedule a consultation.   Follow Up:  -We will see you back for a follow up visit in 2.5-3 weeks for your second round of treatment

## 2021-10-17 ENCOUNTER — Other Ambulatory Visit: Payer: Self-pay | Admitting: Medical Oncology

## 2021-10-17 ENCOUNTER — Telehealth: Payer: Self-pay | Admitting: Medical Oncology

## 2021-10-17 ENCOUNTER — Ambulatory Visit
Admission: RE | Admit: 2021-10-17 | Discharge: 2021-10-17 | Disposition: A | Discharge status: Home / Self Care | Payer: Medicare HMO | Source: Ambulatory Visit | Attending: Family Medicine | Admitting: Family Medicine

## 2021-10-17 ENCOUNTER — Other Ambulatory Visit: Payer: Self-pay

## 2021-10-17 DIAGNOSIS — Z1231 Encounter for screening mammogram for malignant neoplasm of breast: Secondary | ICD-10-CM

## 2021-10-17 DIAGNOSIS — Z23 Encounter for immunization: Secondary | ICD-10-CM | POA: Diagnosis not present

## 2021-10-17 NOTE — Telephone Encounter (Signed)
Pt notified of MRI brain results. She had flu vaccine today. She  had a mammogram recently.Joanne Rogers

## 2021-10-17 NOTE — Telephone Encounter (Signed)
-----   Message from [employer], PA-C sent at 10/17/2021 10:25 AM EDT ----- Regarding: FW: Hey Adelin Ventrella, can you call her and let her know this is negative? She was nervous for the results and I know she'd appreciate just hearing from our office it was negative. Or send a mychart message. She uses [facility]. ----- Message ----- From: Interface, Rad Results In Sent: 10/17/2021   9:35 AM EDT To: Tobe Sos Heilingoetter, PA-C

## 2021-10-19 ENCOUNTER — Other Ambulatory Visit: Payer: Self-pay

## 2021-10-20 ENCOUNTER — Telehealth: Payer: Self-pay | Admitting: Internal Medicine

## 2021-10-20 NOTE — Progress Notes (Signed)
Radiation Oncology         [phone] ________________________________  Name: Joanne Rogers        MRN: [id number]  Date of Service: 10/21/2021 DOB: Jun 17, 1945  [id number], Anastasia Pall, MD  Curt Bears, MD     REFERRING PHYSICIAN: Curt Bears, MD   DIAGNOSIS: The encounter diagnosis was Adenocarcinoma of upper lobe of right lung [facility]).   HISTORY OF PRESENT ILLNESS: Joanne Rogers is a 76 y.o. female seen at the request of Dr. Julien Nordmann for diet status lung cancer. The patient was found to have a nodule in the right upper lobe.  She underwent a PET scan on 09/17/2021 that showed a 2.7 cm hypermetabolic right upper lobe nodule with an SUV of 1.73.  An adjacent right hilar lymph node had an SUV of 8.35 no evidence of other metastatic disease was identified.  An MRI of the brain on 10/14/2021 was negative for metastatic disease.  She did undergo bronchoscopy on 09/29/2021 that showed malignant cells consistent with non-small cell lung cancer favored to be adenocarcinoma in the fine-needle aspirate.  Her 10 R and 7 lymph nodes were negative however her case was discussed in thoracic oncology conference and give her and given her PET positivity, she is seen to discuss chemoradiation.    PREVIOUS RADIATION THERAPY: No   PAST MEDICAL HISTORY:  Past Medical History:  Diagnosis Date   Arthritis    back, hands, neck   COPD (chronic obstructive pulmonary disease) (HCC)    Coronary artery disease 11/28/2019   CCTA 11/28/19: Mild non-obstructive CAD, myocardial bridge mid LAD (cardiologist Dr. Standley Dakins, 08/19/21)   DDD (degenerative disc disease), lumbar    Dyspnea    uses oxygen at home   H/O bronchitis    allergy related   Hypercholesterolemia    Oxygen dependent    Palpitations    "fluttering"   Pneumonia    hx of       PAST SURGICAL HISTORY: Past Surgical History:  Procedure Laterality Date   BRONCHIAL BIOPSY  09/29/2021   Procedure: BRONCHIAL BIOPSIES;  Surgeon: Collene Gobble, MD;  Location: [place];  Service: Pulmonary;;   BRONCHIAL BRUSHINGS  09/29/2021   Procedure: BRONCHIAL BRUSHINGS;  Surgeon: Collene Gobble, MD;  Location: [place];  Service: Pulmonary;;   BRONCHIAL NEEDLE ASPIRATION BIOPSY  09/29/2021   Procedure: BRONCHIAL NEEDLE ASPIRATION BIOPSIES;  Surgeon: Collene Gobble, MD;  Location: [place] [facility];  Service: Pulmonary;;   CARDIOVASCULAR STRESS TEST  11/16/2005   EF 70%, NO ISCHEMIA   CARPAL TUNNEL RELEASE Right ~1988   DILATION AND CURETTAGE OF UTERUS     x3: x2 for miscarrages and x1 for polyp   HEMI-MICRODISCECTOMY LUMBAR LAMINECTOMY LEVEL 1 Left 07/20/2014   Procedure: centeral decompression,L4-L5, HEMI-LAMINECTOMY MICRODISCECTOMY L4 - L5 ON THE LEFT  LEVEL 1, forimatomy 4-5 root on the left;  Surgeon: Latanya Maudlin, MD;  Location: [facility];  Service: Orthopedics;  Laterality: Left;   US ECHOCARDIOGRAPHY  11/19/2005   EF 55-60%   VIDEO BRONCHOSCOPY WITH ENDOBRONCHIAL ULTRASOUND Right 09/29/2021   Procedure: VIDEO BRONCHOSCOPY WITH ENDOBRONCHIAL ULTRASOUND;  Surgeon: Collene Gobble, MD;  Location: [facility];  Service: Pulmonary;  Laterality: Right;   VIDEO BRONCHOSCOPY WITH RADIAL ENDOBRONCHIAL ULTRASOUND  09/29/2021   Procedure: VIDEO BRONCHOSCOPY WITH RADIAL ENDOBRONCHIAL ULTRASOUND;  Surgeon: Collene Gobble, MD;  Location: [facility];  Service: Pulmonary;;     FAMILY HISTORY:  Family History  Problem Relation Age of Onset  Breast cancer Mother 35   Emphysema Maternal Grandfather      SOCIAL HISTORY:  reports that she quit smoking about 16 years ago. Her smoking use included cigarettes. She has a 12.50 pack-year smoking history. She has never used smokeless tobacco. She reports current alcohol use of about 6.0 standard drinks of alcohol per week. She reports that she does not use drugs.   ALLERGIES: Codeine, Amoxicillin, Latex, Levofloxacin, and Statins   MEDICATIONS:  Current Outpatient Medications  Medication Sig  Dispense Refill   acetaminophen (TYLENOL) 500 MG tablet Take 1,000 mg by mouth every 6 (six) hours as needed for moderate pain.     albuterol (VENTOLIN HFA) 108 (90 Base) MCG/ACT inhaler Inhale 1-2 puffs into the lungs every 4 (four) hours as needed for wheezing or shortness of breath. 1 Inhaler 5   ascorbic acid (VITAMIN C) 500 MG tablet Take 500 mg by mouth daily.     aspirin 81 MG tablet Take 81 mg by mouth at bedtime.     azelastine (ASTELIN) 0.1 % nasal spray Place 1 spray into both nostrils as needed for rhinitis.     beta carotene w/minerals (OCUVITE) tablet Take 1 tablet by mouth at bedtime.     bisoprolol (ZEBETA) 5 MG tablet Take 1 tablet (5 mg total) by mouth daily. 90 tablet 3   cetirizine (ZYRTEC) 10 MG tablet Take 10 mg by mouth at bedtime.     cholecalciferol (VITAMIN D3) 25 MCG (1000 UNIT) tablet Take 1,000 Units by mouth daily.     Cyanocobalamin (B-12) 2500 MCG TABS Take 2,500 mcg by mouth daily.     famotidine (PEPCID) 20 MG tablet One at bedtime 30 tablet 11   ibuprofen (ADVIL) 200 MG tablet Take 400 mg by mouth every 6 (six) hours as needed for moderate pain.     ipratropium-albuterol (DUONEB) 0.5-2.5 (3) MG/3ML SOLN Take 3 mLs by nebulization every 4 (four) hours. Dx: J44.9 360 mL 1   Multiple Minerals-Vitamins (CAL MAG ZINC +D3) TABS Take 1-2 tablets by mouth See admin instructions. Take 1 tablet in the morning and 2 tablets in the evening     Multiple Vitamins-Minerals (EMERGEN-C VITAMIN C) PACK Take 1 packet by mouth daily.     OXYGEN 2lpm with sleep     Polyethyl Glycol-Propyl Glycol (SYSTANE OP) Place 1 drop into both eyes daily as needed (dry eyes).     prochlorperazine (COMPAZINE) 10 MG tablet Take 1 tablet (10 mg total) by mouth every 6 (six) hours as needed. 30 tablet 2   Respiratory Therapy Supplies (FLUTTER) DEVI 1 Device by Does not apply route as needed. 1 each 0   SYMBICORT 160-4.5 MCG/ACT inhaler Inhale 2 puffs into the lungs in the morning and at bedtime.  10.2 g 11   Tiotropium Bromide Monohydrate (SPIRIVA RESPIMAT) 2.5 MCG/ACT AERS Inhale 2 puffs into the lungs daily. 4 g 0   Tiotropium Bromide Monohydrate (SPIRIVA RESPIMAT) 2.5 MCG/ACT AERS Inhale 2 puffs into the lungs daily. 4 g 0   valACYclovir (VALTREX) 1000 MG tablet Take 500 mg by mouth every other day.      No current facility-administered medications for this encounter.   Facility-Administered Medications Ordered in Other Encounters  Medication Dose Route Frequency Provider Last Rate Last Admin   CARBOplatin (PARAPLATIN) 150 mg in sodium chloride 0.9 % 100 mL chemo infusion  150 mg Intravenous Once Curt Bears, MD       PACLitaxel (TAXOL) 84 mg in sodium chloride 0.9 % 250  mL chemo infusion (</= 80mg /m2)  45 mg/m2 (Treatment Plan Recorded) Intravenous Once Curt Bears, MD 132 mL/hr at 10/27/21 1507 Rate Change at 10/27/21 1507     REVIEW OF SYSTEMS: On review of systems, the patient reports that she is doing well overall. She denies any chest pain, fevers, chills, night sweats, unintended weight changes. She has had shortness of breath with exertion which is her baseline. She denies hemoptysis. She denies any bowel or bladder disturbances, and denies abdominal pain, nausea or vomiting. She denies any new musculoskeletal or joint aches or pains. A complete review of systems is obtained and is otherwise negative.     PHYSICAL EXAM:  Wt Readings from Last 3 Encounters:  10/27/21 157 lb (71.2 kg)  10/21/21 156 lb 2 oz (70.8 kg)  10/21/21 155 lb (70.3 kg)   Temp Readings from Last 3 Encounters:  10/27/21 97.8 F (36.6 C) (Oral)  10/21/21 (!) 97.3 F (36.3 C) (Temporal)  10/21/21 97.7 F (36.5 C) (Oral)   BP Readings from Last 3 Encounters:  10/27/21 135/61  10/21/21 (!) 144/61  10/21/21 122/60   Pulse Readings from Last 3 Encounters:  10/27/21 66  10/21/21 76  10/21/21 77   Pain Assessment Pain Score: 0-No pain/10  In general this is a well appearing  caucasian female in no acute distress. She's alert and oriented x4 and appropriate throughout the examination. Cardiopulmonary assessment is negative for acute distress and she exhibits normal effort.     ECOG = 1  0 - Asymptomatic (Fully active, able to carry on all predisease activities without restriction)  1 - Symptomatic but completely ambulatory (Restricted in physically strenuous activity but ambulatory and able to carry out work of a light or sedentary nature. For example, light housework, office work)  2 - Symptomatic, <50% in bed during the day (Ambulatory and capable of all self care but unable to carry out any work activities. Up and about more than 50% of waking hours)  3 - Symptomatic, >50% in bed, but not bedbound (Capable of only limited self-care, confined to bed or chair 50% or more of waking hours)  4 - Bedbound (Completely disabled. Cannot carry on any self-care. Totally confined to bed or chair)  5 - Death   Eustace Pen MM, Creech RH, Tormey DC, et al. [phone]). "Toxicity and response criteria of the [facility]". [place] [facility]col. 5 (6): 649-55    LABORATORY DATA:  Lab Results  Component Value Date   WBC 8.1 10/27/2021   HGB 12.6 10/27/2021   HCT 37.5 10/27/2021   MCV 97.9 10/27/2021   PLT 233 10/27/2021   Lab Results  Component Value Date   NA 133 (L) 10/27/2021   K 4.4 10/27/2021   CL 98 10/27/2021   CO2 31 10/27/2021   Lab Results  Component Value Date   ALT 26 10/27/2021   AST 26 10/27/2021   ALKPHOS 82 10/27/2021   BILITOT 0.5 10/27/2021      RADIOGRAPHY: MM 3D SCREEN BREAST BILATERAL  Result Date: 10/20/2021 CLINICAL DATA:  Screening. EXAM: DIGITAL SCREENING BILATERAL MAMMOGRAM WITH TOMOSYNTHESIS AND CAD TECHNIQUE: Bilateral screening digital craniocaudal and mediolateral oblique mammograms were obtained. Bilateral screening digital breast tomosynthesis was performed. The images were evaluated with computer-aided detection.  COMPARISON:  Previous exam(s). ACR Breast Density Category c: The breast tissue is heterogeneously dense, which may obscure small masses. FINDINGS: There are no findings suspicious for malignancy. IMPRESSION: No mammographic evidence of malignancy. A result letter of  this screening mammogram will be mailed directly to the patient. RECOMMENDATION: Screening mammogram in one year. (Code:SM-B-01Y) BI-RADS CATEGORY  1: Negative. Electronically Signed   By: Kristopher Oppenheim M.D.   On: 10/20/2021 10:13   MR Brain W Wo Contrast  Result Date: 10/17/2021 CLINICAL DATA:  Non-small-cell lung cancer staging. EXAM: MRI HEAD WITHOUT AND WITH CONTRAST TECHNIQUE: Multiplanar, multiecho pulse sequences of the brain and surrounding structures were obtained without and with intravenous contrast. CONTRAST:  7 ml Vueway COMPARISON:  None Available. FINDINGS: Brain: No acute infarction, hemorrhage, hydrocephalus, extra-axial collection or mass lesion. There is a small focus of microhemorrhage in the posterior left frontal lobe (series 10, image 49). Small punctate focus of contrast enhancement in the gyrus rectus (series 16, image 70) is favored to represent vascular enhancement. Vascular: Normal flow voids. Skull and upper cervical spine: Normal marrow signal. Sinuses/Orbits: Trace left mastoid effusion. Bilateral lens replacements. Other: None. IMPRESSION: No evidence of intracranial metastatic disease. Electronically Signed   By: Marin Roberts M.D.   On: 10/17/2021 09:33   DG Chest Port 1 View  Result Date: 09/29/2021 CLINICAL DATA:  Status post bronchoscopy EXAM: PORTABLE CHEST 1 VIEW COMPARISON:  08/31/2019, 09/17/2021 FINDINGS: The heart size and mediastinal contours are within normal limits. Aortic atherosclerosis. No focal airspace consolidation, pleural effusion, or pneumothorax. The visualized skeletal structures are unremarkable. IMPRESSION: No pneumothorax status post bronchoscopy. Electronically Signed   By: Davina Poke D.O.   On: 09/29/2021 13:40   DG C-ARM BRONCHOSCOPY  Result Date: 09/29/2021 C-ARM BRONCHOSCOPY: Fluoroscopy was utilized by the requesting physician.  No radiographic interpretation.       IMPRESSION/PLAN: 1. Stage IIB, cT1cN0-1M0, NSCLC, adenocarcinoma of the RUL. Dr. Lisbeth Renshaw discusses the pathology findings and reviews the nature of locally advanced lung cancer. Dr. Lisbeth Renshaw agrees that the PET scan is quite suspicious for nodal involvement despite the inconsistency with her biopsy being negative. He offers radiation with chemosensitization. We discussed the risks, benefits, short, and long term effects of radiotherapy, as well as the curative intent, and the patient is interested in proceeding. Dr. Lisbeth Renshaw discusses the delivery and logistics of radiotherapy and anticipates a course of 6 1/2 weeks of radiotherapy to the chest. Written consent is obtained and placed in the chart, a copy was provided to the patient. She will simulate tomorrow and start treatment on 10/27/21.   In a visit lasting 60 minutes, greater than 50% of the time was spent face to face discussing the patient's condition, in preparation for the discussion, and coordinating the patient's care.   The above documentation reflects my direct findings during this shared patient visit. Please see the separate note by Dr. Lisbeth Renshaw on this date for the remainder of the patient's plan of care.    Carola Rhine, [facility]   **Disclaimer: This note was dictated with voice recognition software. Similar sounding words can inadvertently be transcribed and this note may contain transcription errors which may not have been corrected upon publication of note.**

## 2021-10-20 NOTE — Telephone Encounter (Signed)
Scheduled per 10/05 los and work-queue, patient has been called and notified of upcoming appointments.

## 2021-10-20 NOTE — Progress Notes (Signed)
Thoracic Location of Tumor / Histology: Right Upper Lobe Lung, Right Hilar Lymph Node  Patient presented for surveillance scans for a right upper lobe ground-glass nodule.  The nodule has been slowly growing and now there is an adjacent right hilar node that is hypermetabolic on the PET.  MRI Brain 10/14/2021: No evidence of intracranial metastatic disease.   PET 09/17/2021: 2.7 x 2.1 cm ground-glass lesion in the right upper lobe most consistent with adenocarcinoma.  9 mm right hilar node demonstrates hypermetabolism on the PET-CT and is worrisome for neoplastic (metastatic) adenopathy.  Stable emphysematous changes and pulmonary scarring.  No findings for upper abdominal metastatic disease.  CT Super D Chest 06/12/2021: Increase in size of the ground-glass right upper lobe pulmonary nodule which now measures 2.6 x 1.8 cm and is suspicious for slow growing bronchogenic neoplasm, recommend further evaluation with nuclear medicine PET-CT.   Biopsies of RUL Lung / Lymph Node 09/29/2021     Tobacco/Marijuana/Snuff/ETOH use:    Past/Anticipated interventions by cardiothoracic surgery, if any:  Dr. Roxan Hockey -No surgical interventions due to the central location and the nodule crossing the fissure into the middle lobe.  To achieve adequate margins, she likely would need a bilobectomy and her pulmonary reserve is not adequate to tolerate a major resection.   Past/Anticipated interventions by medical oncology, if any:  Dr. Julien Nordmann / Cassie Heilingoetter 10/16/2021 -We will request foundation 1 and PD-L1 testing on the patient's biopsy. -Dr. Julien Nordmann recommends concurrent chemoradiation with carboplatin for an AUC of 2 and paclitaxel 45 mg/m2 weekly for 6 weeks with concurrent radiation.  The patient is interested in proceeding with systemic chemotherapy.  She is expected to start her first dose of this treatment on 10/27/21.  -I have placed a referral to radiation oncology.     Signs/Symptoms Weight changes, if any:  Respiratory complaints, if any: Has baseline dyspnea on exertion due to COPD. Hemoptysis, if any:  Pain issues, if any:    SAFETY ISSUES: Prior radiation?  Pacemaker/ICD?   Possible current pregnancy? Postmenopausal Is the patient on methotrexate?   Current Complaints / other details:

## 2021-10-21 ENCOUNTER — Ambulatory Visit: Payer: Medicare HMO | Admitting: Radiation Oncology

## 2021-10-21 ENCOUNTER — Other Ambulatory Visit: Payer: Self-pay

## 2021-10-21 ENCOUNTER — Ambulatory Visit
Admission: RE | Admit: 2021-10-21 | Discharge: 2021-10-21 | Disposition: A | Discharge status: Home / Self Care | Payer: Medicare HMO | Source: Ambulatory Visit | Attending: Radiation Oncology | Admitting: Radiation Oncology

## 2021-10-21 ENCOUNTER — Ambulatory Visit: Payer: Medicare HMO

## 2021-10-21 ENCOUNTER — Ambulatory Visit: Payer: Medicare HMO | Admitting: Emergency Medicine

## 2021-10-21 ENCOUNTER — Encounter: Payer: Self-pay | Admitting: Emergency Medicine

## 2021-10-21 ENCOUNTER — Encounter: Payer: Self-pay | Admitting: Radiation Oncology

## 2021-10-21 VITALS — BP 144/61 | HR 76 | Temp 97.3°F | Resp 18 | Ht 66.0 in | Wt 156.1 lb

## 2021-10-21 DIAGNOSIS — J301 Allergic rhinitis due to pollen: Secondary | ICD-10-CM | POA: Diagnosis not present

## 2021-10-21 DIAGNOSIS — I2510 Atherosclerotic heart disease of native coronary artery without angina pectoris: Secondary | ICD-10-CM

## 2021-10-21 DIAGNOSIS — C3491 Malignant neoplasm of unspecified part of right bronchus or lung: Secondary | ICD-10-CM

## 2021-10-21 DIAGNOSIS — Z87891 Personal history of nicotine dependence: Secondary | ICD-10-CM | POA: Insufficient documentation

## 2021-10-21 DIAGNOSIS — C3411 Malignant neoplasm of upper lobe, right bronchus or lung: Secondary | ICD-10-CM

## 2021-10-21 DIAGNOSIS — J449 Chronic obstructive pulmonary disease, unspecified: Secondary | ICD-10-CM

## 2021-10-21 MED ORDER — SPIRIVA RESPIMAT 2.5 MCG/ACT IN AERS: 2.0000 | INHALATION_SPRAY | Freq: Every day | RESPIRATORY_TRACT | 0 refills | Status: DC

## 2021-10-21 NOTE — Assessment & Plan Note (Signed)
She describes some exertional chest discomfort, fleeting but does get better with rest.  If persistent, progressive then we will need to get her back into see her cardiologist.

## 2021-10-21 NOTE — Assessment & Plan Note (Signed)
Following with Dr. Julien Nordmann and Dr. Lisbeth Renshaw.  Planning for concomitant chemoradiation

## 2021-10-21 NOTE — Patient Instructions (Addendum)
Follow with Dr. Lisbeth Renshaw and Dr. Julien Nordmann as planned. Continue Spiriva and Symbicort as you have been taking it.  Remember to rinse and gargle after using. Keep albuterol available to use 2 puffs if needed for shortness of breath. Continue your cetirizine (Zyrtec) as you have been taking it. Try stopping your Astelin nasal spray Please call us if you continue to have congestion, drainage and throat irritation.  If so the we can try starting fluticasone nasal spray, 2 sprays each nostril once daily. Flu shot is up-to-date Agree with getting the COVID-19 vaccine this fall.  Please confirm the appropriate timing with oncology Agree with getting the RSV vaccine this fall Follow with Dr Lamonte Sakai in 3 months or sooner if you have any problems.

## 2021-10-21 NOTE — Addendum Note (Signed)
Addended by: Gavin Potters R on: 10/21/2021 10:22 AM   Modules accepted: Orders

## 2021-10-21 NOTE — Progress Notes (Signed)
Subjective:    Patient ID: Joanne Rogers, female    DOB: May 05, 1945, 76 y.o.   MRN: [id number]  HPI  ROV 09/18/2021 --this follow-up visit for 76 year old woman with history of COPD, documented severe obstruction, alpha-1 antitrypsin MZ.  She has a mixed density 2.6 cm right upper lobe pulmonary nodule that we have been following with serial imaging.  It has grown over time but we have been following conservatively.  Also other stable scattered nodules measuring up to 9 mm in the right middle lobe.  We decided to perform a PET CT to follow the nodule as below.  PET scan super D CT done 09/17/2021 and reviewed by me shows that the right upper lobe groundglass lesion has low-level hypermetabolism consistent with an adenocarcinoma.  There is also a 9 mm right hilar lymph node that is hypermetabolic and suggestive of metastatic adenopathy.  No evidence of distant metastatic disease.  00/93/8182 --Joanne Rogers is 45 with a history severe COPD, severe obstruction on pulmonary function testing.  Currently managed on Spiriva and Symbicort.  She also has a right upper lobe groundglass opacity recently diagnosed by navigational bronchoscopy and is in no carcinoma.  She has seen oncology and radiation oncology. Today she reports that she believes that her breathing and functional capacity are a bit worse since her bronchoscopy. She has occasional chest discomfort w exertion. Some occasional wheeze, rare cough. She has a a lot of allergy and nasal gtt right now. On zyrtec, astelin - not very effective. No nasal steroid.   MRI brain 10/14/2021 without any evidence of intracranial metastatic disease    Review of Systems As per HPI     Objective:   Physical Exam Vitals:   10/21/21 0941  BP: 122/60  Pulse: 77  Temp: 97.7 F (36.5 C)  TempSrc: Oral  SpO2: 95%  Weight: 155 lb (70.3 kg)  Height: 5\' 6"  (1.676 m)   Gen: Pleasant, well-nourished, in no distress,  normal affect  ENT: No lesions,  mouth clear,   oropharynx clear, no postnasal drip  Neck: No JVD, no stridor  Lungs: No use of accessory muscles, no crackles or wheezing on normal respiration, no wheeze on forced expiration  Cardiovascular: RRR, heart sounds normal, no murmur or gallops, no peripheral edem  Musculoskeletal: No deformities, no cyanosis or clubbing  Neuro: alert, awake, non focal  Skin: Warm, no lesions or rash      Assessment & Plan:  Adenocarcinoma of right lung, stage 2 (HCC) Following with Dr. Julien Nordmann and Dr. Lisbeth Renshaw.  Planning for concomitant chemoradiation  COPD GOLD III No evidence of an acute exacerbation currently.  Continue Spiriva and Symbicort as you have been taking it.  Remember to rinse and gargle after using. Keep albuterol available to use 2 puffs if needed for shortness of breath. Flu shot is up-to-date Agree with getting the COVID-19 vaccine this fall.  Please confirm the appropriate timing with oncology Agree with getting the RSV vaccine this fall Follow with Dr Lamonte Sakai in 3 months or sooner if you have any problems.  CAD (coronary artery disease) She describes some exertional chest discomfort, fleeting but does get better with rest.  If persistent, progressive then we will need to get her back into see her cardiologist.  Allergic rhinitis Continue your cetirizine (Zyrtec) as you have been taking it. Try stopping your Astelin nasal spray Please call us if you continue to have congestion, drainage and throat irritation.  If so the we can try starting fluticasone  nasal spray, 2 sprays each nostril once daily.  Baltazar Apo, MD, PhD 10/21/2021, 10:08 AM [facility] [phone] or if no answer [phone]

## 2021-10-21 NOTE — Progress Notes (Signed)
The pharmacy team has substituted IV diphenhydramine for IV cetirizine as a premedication. Patient will be monitored for hypersensitivity reaction and adverse reactions to IV cetirizine. Thanks.   Kennith Center, Pharm.D., CPP 10/21/2021@3 :49 PM

## 2021-10-21 NOTE — Assessment & Plan Note (Signed)
Continue your cetirizine (Zyrtec) as you have been taking it. Try stopping your Astelin nasal spray Please call us if you continue to have congestion, drainage and throat irritation.  If so the we can try starting fluticasone nasal spray, 2 sprays each nostril once daily.

## 2021-10-21 NOTE — Assessment & Plan Note (Signed)
No evidence of an acute exacerbation currently.  Continue Spiriva and Symbicort as you have been taking it.  Remember to rinse and gargle after using. Keep albuterol available to use 2 puffs if needed for shortness of breath. Flu shot is up-to-date Agree with getting the COVID-19 vaccine this fall.  Please confirm the appropriate timing with oncology Agree with getting the RSV vaccine this fall Follow with Dr Lamonte Sakai in 3 months or sooner if you have any problems.

## 2021-10-22 ENCOUNTER — Ambulatory Visit
Admission: RE | Admit: 2021-10-22 | Discharge: 2021-10-22 | Disposition: A | Discharge status: Home / Self Care | Payer: Medicare HMO | Source: Ambulatory Visit | Attending: Radiation Oncology | Admitting: Radiation Oncology

## 2021-10-22 ENCOUNTER — Inpatient Hospital Stay: Payer: Medicare HMO

## 2021-10-22 ENCOUNTER — Encounter (HOSPITAL_COMMUNITY): Payer: Self-pay

## 2021-10-22 DIAGNOSIS — Z510 Encounter for antineoplastic radiation therapy: Secondary | ICD-10-CM | POA: Insufficient documentation

## 2021-10-22 DIAGNOSIS — C3411 Malignant neoplasm of upper lobe, right bronchus or lung: Secondary | ICD-10-CM | POA: Diagnosis not present

## 2021-10-22 DIAGNOSIS — J449 Chronic obstructive pulmonary disease, unspecified: Secondary | ICD-10-CM | POA: Diagnosis not present

## 2021-10-22 DIAGNOSIS — Z87891 Personal history of nicotine dependence: Secondary | ICD-10-CM | POA: Diagnosis not present

## 2021-10-22 DIAGNOSIS — I700 Atherosclerosis of aorta: Secondary | ICD-10-CM | POA: Diagnosis not present

## 2021-10-22 DIAGNOSIS — I809 Phlebitis and thrombophlebitis of unspecified site: Secondary | ICD-10-CM | POA: Diagnosis not present

## 2021-10-22 DIAGNOSIS — R21 Rash and other nonspecific skin eruption: Secondary | ICD-10-CM | POA: Diagnosis not present

## 2021-10-22 DIAGNOSIS — K7689 Other specified diseases of liver: Secondary | ICD-10-CM | POA: Diagnosis not present

## 2021-10-22 DIAGNOSIS — M129 Arthropathy, unspecified: Secondary | ICD-10-CM | POA: Diagnosis not present

## 2021-10-22 DIAGNOSIS — Z5111 Encounter for antineoplastic chemotherapy: Secondary | ICD-10-CM | POA: Diagnosis not present

## 2021-10-23 ENCOUNTER — Other Ambulatory Visit: Payer: Self-pay

## 2021-10-23 ENCOUNTER — Telehealth: Payer: Self-pay

## 2021-10-23 DIAGNOSIS — Z5111 Encounter for antineoplastic chemotherapy: Secondary | ICD-10-CM | POA: Diagnosis not present

## 2021-10-23 DIAGNOSIS — Z87891 Personal history of nicotine dependence: Secondary | ICD-10-CM | POA: Diagnosis not present

## 2021-10-23 DIAGNOSIS — R21 Rash and other nonspecific skin eruption: Secondary | ICD-10-CM | POA: Diagnosis not present

## 2021-10-23 DIAGNOSIS — M129 Arthropathy, unspecified: Secondary | ICD-10-CM | POA: Diagnosis not present

## 2021-10-23 DIAGNOSIS — Z510 Encounter for antineoplastic radiation therapy: Secondary | ICD-10-CM | POA: Diagnosis not present

## 2021-10-23 DIAGNOSIS — I700 Atherosclerosis of aorta: Secondary | ICD-10-CM | POA: Diagnosis not present

## 2021-10-23 DIAGNOSIS — K7689 Other specified diseases of liver: Secondary | ICD-10-CM | POA: Diagnosis not present

## 2021-10-23 DIAGNOSIS — C3411 Malignant neoplasm of upper lobe, right bronchus or lung: Secondary | ICD-10-CM | POA: Diagnosis not present

## 2021-10-23 DIAGNOSIS — J449 Chronic obstructive pulmonary disease, unspecified: Secondary | ICD-10-CM | POA: Diagnosis not present

## 2021-10-23 DIAGNOSIS — I809 Phlebitis and thrombophlebitis of unspecified site: Secondary | ICD-10-CM | POA: Diagnosis not present

## 2021-10-23 NOTE — Telephone Encounter (Signed)
Faxed [phone]) att: Mae over progress note, scan, lab and genetic test result to [facility] #[id number].

## 2021-10-24 ENCOUNTER — Encounter: Payer: Self-pay | Admitting: Internal Medicine

## 2021-10-24 MED FILL — Dexamethasone Sodium Phosphate Inj 100 MG/10ML: INTRAMUSCULAR | Qty: 1 | Status: AC

## 2021-10-24 NOTE — Progress Notes (Signed)
Called pt to introduce myself as her Arboriculturist and to discuss the [employer].  She would like to apply so she will bring her proof of income on 10/27/21.  If approved I will give her an expense sheet and my card for any questions or concerns she may have in the future.

## 2021-10-27 ENCOUNTER — Other Ambulatory Visit: Payer: Medicare HMO

## 2021-10-27 ENCOUNTER — Other Ambulatory Visit: Payer: Self-pay

## 2021-10-27 ENCOUNTER — Ambulatory Visit
Admission: RE | Admit: 2021-10-27 | Discharge: 2021-10-27 | Disposition: A | Discharge status: Home / Self Care | Payer: Medicare HMO | Source: Ambulatory Visit | Attending: Radiation Oncology | Admitting: Radiation Oncology

## 2021-10-27 ENCOUNTER — Inpatient Hospital Stay: Payer: Medicare HMO

## 2021-10-27 ENCOUNTER — Ambulatory Visit: Payer: Medicare HMO

## 2021-10-27 VITALS — BP 146/70 | HR 65 | Temp 97.8°F | Resp 18 | Wt 157.0 lb

## 2021-10-27 DIAGNOSIS — Z5111 Encounter for antineoplastic chemotherapy: Secondary | ICD-10-CM | POA: Diagnosis not present

## 2021-10-27 DIAGNOSIS — C3490 Malignant neoplasm of unspecified part of unspecified bronchus or lung: Secondary | ICD-10-CM | POA: Diagnosis not present

## 2021-10-27 DIAGNOSIS — R21 Rash and other nonspecific skin eruption: Secondary | ICD-10-CM | POA: Diagnosis not present

## 2021-10-27 DIAGNOSIS — C3491 Malignant neoplasm of unspecified part of right bronchus or lung: Secondary | ICD-10-CM

## 2021-10-27 DIAGNOSIS — M129 Arthropathy, unspecified: Secondary | ICD-10-CM | POA: Diagnosis not present

## 2021-10-27 DIAGNOSIS — C3411 Malignant neoplasm of upper lobe, right bronchus or lung: Secondary | ICD-10-CM | POA: Diagnosis not present

## 2021-10-27 DIAGNOSIS — K7689 Other specified diseases of liver: Secondary | ICD-10-CM | POA: Diagnosis not present

## 2021-10-27 DIAGNOSIS — Z87891 Personal history of nicotine dependence: Secondary | ICD-10-CM | POA: Diagnosis not present

## 2021-10-27 DIAGNOSIS — J449 Chronic obstructive pulmonary disease, unspecified: Secondary | ICD-10-CM | POA: Diagnosis not present

## 2021-10-27 DIAGNOSIS — I809 Phlebitis and thrombophlebitis of unspecified site: Secondary | ICD-10-CM | POA: Diagnosis not present

## 2021-10-27 DIAGNOSIS — Z510 Encounter for antineoplastic radiation therapy: Secondary | ICD-10-CM | POA: Diagnosis not present

## 2021-10-27 DIAGNOSIS — I700 Atherosclerosis of aorta: Secondary | ICD-10-CM | POA: Diagnosis not present

## 2021-10-27 LAB — RAD ONC ARIA SESSION SUMMARY
Course Elapsed Days: 0
Plan Fractions Treated to Date: 1
Plan Prescribed Dose Per Fraction: 2 Gy
Plan Total Fractions Prescribed: 30
Plan Total Prescribed Dose: 60 Gy
Reference Point Dosage Given to Date: 2 Gy
Reference Point Session Dosage Given: 2 Gy
Session Number: 1

## 2021-10-27 LAB — CMP (CANCER CENTER ONLY)
ALT: 26 U/L (ref 0–44)
AST: 26 U/L (ref 15–41)
Albumin: 4.1 g/dL (ref 3.5–5.0)
Alkaline Phosphatase: 82 U/L (ref 38–126)
Anion gap: 4 — ABNORMAL LOW (ref 5–15)
BUN: 10 mg/dL (ref 8–23)
CO2: 31 mmol/L (ref 22–32)
Calcium: 9.5 mg/dL (ref 8.9–10.3)
Chloride: 98 mmol/L (ref 98–111)
Creatinine: 0.52 mg/dL (ref 0.44–1.00)
GFR, Estimated: >60 mL/min (ref >60)
Glucose, Bld: 119 mg/dL — ABNORMAL HIGH (ref 70–99)
Potassium: 4.4 mmol/L (ref 3.5–5.1)
Sodium: 133 mmol/L — ABNORMAL LOW (ref 135–145)
Total Bilirubin: 0.5 mg/dL (ref 0.3–1.2)
Total Protein: 7.1 g/dL (ref 6.5–8.1)

## 2021-10-27 LAB — CBC WITH DIFFERENTIAL (CANCER CENTER ONLY)
Abs Immature Granulocytes: 0.03 10*3/uL (ref 0.00–0.07)
Basophils Absolute: 0 10*3/uL (ref 0.0–0.1)
Basophils Relative: 0 %
Eosinophils Absolute: 0.1 10*3/uL (ref 0.0–0.5)
Eosinophils Relative: 2 %
HCT: 37.5 % (ref 36.0–46.0)
Hemoglobin: 12.6 g/dL (ref 12.0–15.0)
Immature Granulocytes: 0 %
Lymphocytes Relative: 17 %
Lymphs Abs: 1.4 10*3/uL (ref 0.7–4.0)
MCH: 32.9 pg (ref 26.0–34.0)
MCHC: 33.6 g/dL (ref 30.0–36.0)
MCV: 97.9 fL (ref 80.0–100.0)
Monocytes Absolute: 0.6 10*3/uL (ref 0.1–1.0)
Monocytes Relative: 8 %
Neutro Abs: 5.9 10*3/uL (ref 1.7–7.7)
Neutrophils Relative %: 73 %
Platelet Count: 233 10*3/uL (ref 150–400)
RBC: 3.83 MIL/uL — ABNORMAL LOW (ref 3.87–5.11)
RDW: 12.4 % (ref 11.5–15.5)
WBC Count: 8.1 10*3/uL (ref 4.0–10.5)
nRBC: 0 % (ref 0.0–0.2)

## 2021-10-27 MED ORDER — FAMOTIDINE IN NACL 20-0.9 MG/50ML-% IV SOLN
20.0000 mg | Freq: Once | INTRAVENOUS | Status: AC
  Administered 2021-10-27: 20 mg via INTRAVENOUS
  Filled 2021-10-27: qty 50

## 2021-10-27 MED ORDER — SODIUM CHLORIDE 0.9 % IV SOLN
10.0000 mg | Freq: Once | INTRAVENOUS | Status: AC
  Administered 2021-10-27: 10 mg via INTRAVENOUS
  Filled 2021-10-27: qty 10

## 2021-10-27 MED ORDER — SODIUM CHLORIDE 0.9 % IV SOLN
45.0000 mg/m2 | Freq: Once | INTRAVENOUS | Status: AC
  Administered 2021-10-27: 84 mg via INTRAVENOUS
  Filled 2021-10-27: qty 14

## 2021-10-27 MED ORDER — PALONOSETRON HCL INJECTION 0.25 MG/5ML
0.2500 mg | Freq: Once | INTRAVENOUS | Status: AC
  Administered 2021-10-27: 0.25 mg via INTRAVENOUS
  Filled 2021-10-27: qty 5

## 2021-10-27 MED ORDER — CETIRIZINE HCL 10 MG/ML IV SOLN
10.0000 mg | Freq: Once | INTRAVENOUS | Status: AC
  Administered 2021-10-27: 10 mg via INTRAVENOUS
  Filled 2021-10-27: qty 1

## 2021-10-27 MED ORDER — SODIUM CHLORIDE 0.9 % IV SOLN
153.6000 mg | Freq: Once | INTRAVENOUS | Status: AC
  Administered 2021-10-27: 150 mg via INTRAVENOUS
  Filled 2021-10-27: qty 15

## 2021-10-27 MED ORDER — SODIUM CHLORIDE 0.9 % IV SOLN: Freq: Once | INTRAVENOUS | Status: AC

## 2021-10-27 NOTE — Patient Instructions (Signed)
[facility]  Discharge Instructions: Thank you for choosing [facility] to provide your oncology and hematology care.   If you have a lab appointment with the Cancer Center, please go directly to the Cancer Center and check in at the registration area.   Wear comfortable clothing and clothing appropriate for easy access to any Portacath or PICC line.   We strive to give you quality time with your provider. You may need to reschedule your appointment if you arrive late (15 or more minutes).  Arriving late affects you and other patients whose appointments are after yours.  Also, if you miss three or more appointments without notifying the office, you may be dismissed from the clinic at the provider's discretion.      For prescription refill requests, have your pharmacy contact our office and allow 72 hours for refills to be completed.    Today you received the following chemotherapy and/or immunotherapy agents: Paclitaxel (Taxol) & Carboplatin     To help prevent nausea and vomiting after your treatment, we encourage you to take your nausea medication as directed.  BELOW ARE SYMPTOMS THAT SHOULD BE REPORTED IMMEDIATELY: *FEVER GREATER THAN 100.4 F (38 C) OR HIGHER *CHILLS OR SWEATING *NAUSEA AND VOMITING THAT IS NOT CONTROLLED WITH YOUR NAUSEA MEDICATION *UNUSUAL SHORTNESS OF BREATH *UNUSUAL BRUISING OR BLEEDING *URINARY PROBLEMS (pain or burning when urinating, or frequent urination) *BOWEL PROBLEMS (unusual diarrhea, constipation, pain near the anus) TENDERNESS IN MOUTH AND THROAT WITH OR WITHOUT PRESENCE OF ULCERS (sore throat, sores in mouth, or a toothache) UNUSUAL RASH, SWELLING OR PAIN  UNUSUAL VAGINAL DISCHARGE OR ITCHING   Items with * indicate a potential emergency and should be followed up as soon as possible or go to the Emergency Department if any problems should occur.  Please show the CHEMOTHERAPY ALERT CARD or IMMUNOTHERAPY  ALERT CARD at check-in to the Emergency Department and triage nurse.  Should you have questions after your visit or need to cancel or reschedule your appointment, please contact [facility]  Dept: [phone]  and follow the prompts.  Office hours are 8:00 a.m. to 4:30 p.m. Monday - Friday. Please note that voicemails left after 4:00 p.m. may not be returned until the following business day.  We are closed weekends and major holidays. You have access to a nurse at all times for urgent questions. Please call the main number to the clinic Dept: [phone] and follow the prompts.   For any non-urgent questions, you may also contact your provider using MyChart. We now offer e-Visits for anyone 18 and older to request care online for non-urgent symptoms. For details visit [email or web address].   Also download the MyChart app! Go to the app store, search "MyChart", open the app, select [facility], and log in with your MyChart username and password.  Masks are optional in the cancer centers. If you would like for your care team to wear a mask while they are taking care of you, please let them know. You may have one support person who is at least 78 years old accompany you for your appointments. 

## 2021-10-28 ENCOUNTER — Ambulatory Visit
Admission: RE | Admit: 2021-10-28 | Discharge: 2021-10-28 | Disposition: A | Discharge status: Home / Self Care | Payer: Medicare HMO | Source: Ambulatory Visit | Attending: Radiation Oncology | Admitting: Radiation Oncology

## 2021-10-28 ENCOUNTER — Other Ambulatory Visit: Payer: Self-pay

## 2021-10-28 DIAGNOSIS — I809 Phlebitis and thrombophlebitis of unspecified site: Secondary | ICD-10-CM | POA: Diagnosis not present

## 2021-10-28 DIAGNOSIS — C3411 Malignant neoplasm of upper lobe, right bronchus or lung: Secondary | ICD-10-CM | POA: Diagnosis not present

## 2021-10-28 DIAGNOSIS — K7689 Other specified diseases of liver: Secondary | ICD-10-CM | POA: Diagnosis not present

## 2021-10-28 DIAGNOSIS — J449 Chronic obstructive pulmonary disease, unspecified: Secondary | ICD-10-CM | POA: Diagnosis not present

## 2021-10-28 DIAGNOSIS — Z5111 Encounter for antineoplastic chemotherapy: Secondary | ICD-10-CM | POA: Diagnosis not present

## 2021-10-28 DIAGNOSIS — R21 Rash and other nonspecific skin eruption: Secondary | ICD-10-CM | POA: Diagnosis not present

## 2021-10-28 DIAGNOSIS — I700 Atherosclerosis of aorta: Secondary | ICD-10-CM | POA: Diagnosis not present

## 2021-10-28 DIAGNOSIS — Z87891 Personal history of nicotine dependence: Secondary | ICD-10-CM | POA: Diagnosis not present

## 2021-10-28 DIAGNOSIS — M129 Arthropathy, unspecified: Secondary | ICD-10-CM | POA: Diagnosis not present

## 2021-10-28 DIAGNOSIS — Z510 Encounter for antineoplastic radiation therapy: Secondary | ICD-10-CM | POA: Diagnosis not present

## 2021-10-28 LAB — RAD ONC ARIA SESSION SUMMARY
Course Elapsed Days: 1
Plan Fractions Treated to Date: 2
Plan Prescribed Dose Per Fraction: 2 Gy
Plan Total Fractions Prescribed: 30
Plan Total Prescribed Dose: 60 Gy
Reference Point Dosage Given to Date: 4 Gy
Reference Point Session Dosage Given: 2 Gy
Session Number: 2

## 2021-10-29 ENCOUNTER — Other Ambulatory Visit: Payer: Self-pay

## 2021-10-29 ENCOUNTER — Ambulatory Visit
Admission: RE | Admit: 2021-10-29 | Discharge: 2021-10-29 | Disposition: A | Discharge status: Home / Self Care | Payer: Medicare HMO | Source: Ambulatory Visit | Attending: Radiation Oncology | Admitting: Radiation Oncology

## 2021-10-29 ENCOUNTER — Ambulatory Visit: Payer: Self-pay

## 2021-10-29 DIAGNOSIS — I809 Phlebitis and thrombophlebitis of unspecified site: Secondary | ICD-10-CM | POA: Diagnosis not present

## 2021-10-29 DIAGNOSIS — R21 Rash and other nonspecific skin eruption: Secondary | ICD-10-CM | POA: Diagnosis not present

## 2021-10-29 DIAGNOSIS — Z87891 Personal history of nicotine dependence: Secondary | ICD-10-CM | POA: Diagnosis not present

## 2021-10-29 DIAGNOSIS — Z5111 Encounter for antineoplastic chemotherapy: Secondary | ICD-10-CM | POA: Diagnosis not present

## 2021-10-29 DIAGNOSIS — K7689 Other specified diseases of liver: Secondary | ICD-10-CM | POA: Diagnosis not present

## 2021-10-29 DIAGNOSIS — Z510 Encounter for antineoplastic radiation therapy: Secondary | ICD-10-CM | POA: Diagnosis not present

## 2021-10-29 DIAGNOSIS — M129 Arthropathy, unspecified: Secondary | ICD-10-CM | POA: Diagnosis not present

## 2021-10-29 DIAGNOSIS — C3411 Malignant neoplasm of upper lobe, right bronchus or lung: Secondary | ICD-10-CM | POA: Diagnosis not present

## 2021-10-29 DIAGNOSIS — I700 Atherosclerosis of aorta: Secondary | ICD-10-CM | POA: Diagnosis not present

## 2021-10-29 DIAGNOSIS — J449 Chronic obstructive pulmonary disease, unspecified: Secondary | ICD-10-CM | POA: Diagnosis not present

## 2021-10-29 LAB — RAD ONC ARIA SESSION SUMMARY
Course Elapsed Days: 2
Plan Fractions Treated to Date: 3
Plan Prescribed Dose Per Fraction: 2 Gy
Plan Total Fractions Prescribed: 30
Plan Total Prescribed Dose: 60 Gy
Reference Point Dosage Given to Date: 6 Gy
Reference Point Session Dosage Given: 2 Gy
Session Number: 3

## 2021-10-29 NOTE — Progress Notes (Signed)
Called back Joanne Rogers for her chemo follow up. She is doing well but a knot came up that appeared to be a hematoma in the middle of the night.  It was not near her IV site but on the back of her arm.  It has decreased in puffiness with icing.  She would like a nurse in radiation to look at it following her 2 pm treatment just to ease her concerns.  Gardiner Rhyme, RN

## 2021-10-30 ENCOUNTER — Ambulatory Visit
Admission: RE | Admit: 2021-10-30 | Discharge: 2021-10-30 | Disposition: A | Discharge status: Home / Self Care | Payer: Medicare HMO | Source: Ambulatory Visit | Attending: Radiation Oncology | Admitting: Radiation Oncology

## 2021-10-30 ENCOUNTER — Other Ambulatory Visit: Payer: Self-pay

## 2021-10-30 DIAGNOSIS — R21 Rash and other nonspecific skin eruption: Secondary | ICD-10-CM | POA: Diagnosis not present

## 2021-10-30 DIAGNOSIS — Z87891 Personal history of nicotine dependence: Secondary | ICD-10-CM | POA: Diagnosis not present

## 2021-10-30 DIAGNOSIS — Z5111 Encounter for antineoplastic chemotherapy: Secondary | ICD-10-CM | POA: Diagnosis not present

## 2021-10-30 DIAGNOSIS — I700 Atherosclerosis of aorta: Secondary | ICD-10-CM | POA: Diagnosis not present

## 2021-10-30 DIAGNOSIS — I809 Phlebitis and thrombophlebitis of unspecified site: Secondary | ICD-10-CM | POA: Diagnosis not present

## 2021-10-30 DIAGNOSIS — J449 Chronic obstructive pulmonary disease, unspecified: Secondary | ICD-10-CM | POA: Diagnosis not present

## 2021-10-30 DIAGNOSIS — Z510 Encounter for antineoplastic radiation therapy: Secondary | ICD-10-CM | POA: Diagnosis not present

## 2021-10-30 DIAGNOSIS — K7689 Other specified diseases of liver: Secondary | ICD-10-CM | POA: Diagnosis not present

## 2021-10-30 DIAGNOSIS — C3411 Malignant neoplasm of upper lobe, right bronchus or lung: Secondary | ICD-10-CM | POA: Diagnosis not present

## 2021-10-30 DIAGNOSIS — M129 Arthropathy, unspecified: Secondary | ICD-10-CM | POA: Diagnosis not present

## 2021-10-30 LAB — RAD ONC ARIA SESSION SUMMARY
Course Elapsed Days: 3
Plan Fractions Treated to Date: 4
Plan Prescribed Dose Per Fraction: 2 Gy
Plan Total Fractions Prescribed: 30
Plan Total Prescribed Dose: 60 Gy
Reference Point Dosage Given to Date: 8 Gy
Reference Point Session Dosage Given: 2 Gy
Session Number: 4

## 2021-10-31 ENCOUNTER — Other Ambulatory Visit: Payer: Self-pay

## 2021-10-31 ENCOUNTER — Ambulatory Visit
Admission: RE | Admit: 2021-10-31 | Discharge: 2021-10-31 | Disposition: A | Discharge status: Home / Self Care | Payer: Medicare HMO | Source: Ambulatory Visit | Attending: Radiation Oncology | Admitting: Radiation Oncology

## 2021-10-31 DIAGNOSIS — Z87891 Personal history of nicotine dependence: Secondary | ICD-10-CM | POA: Diagnosis not present

## 2021-10-31 DIAGNOSIS — I809 Phlebitis and thrombophlebitis of unspecified site: Secondary | ICD-10-CM | POA: Diagnosis not present

## 2021-10-31 DIAGNOSIS — Z5111 Encounter for antineoplastic chemotherapy: Secondary | ICD-10-CM | POA: Diagnosis not present

## 2021-10-31 DIAGNOSIS — R21 Rash and other nonspecific skin eruption: Secondary | ICD-10-CM | POA: Diagnosis not present

## 2021-10-31 DIAGNOSIS — K7689 Other specified diseases of liver: Secondary | ICD-10-CM | POA: Diagnosis not present

## 2021-10-31 DIAGNOSIS — C3411 Malignant neoplasm of upper lobe, right bronchus or lung: Secondary | ICD-10-CM | POA: Diagnosis not present

## 2021-10-31 DIAGNOSIS — I700 Atherosclerosis of aorta: Secondary | ICD-10-CM | POA: Diagnosis not present

## 2021-10-31 DIAGNOSIS — Z510 Encounter for antineoplastic radiation therapy: Secondary | ICD-10-CM | POA: Diagnosis not present

## 2021-10-31 DIAGNOSIS — C3491 Malignant neoplasm of unspecified part of right bronchus or lung: Secondary | ICD-10-CM

## 2021-10-31 DIAGNOSIS — J449 Chronic obstructive pulmonary disease, unspecified: Secondary | ICD-10-CM | POA: Diagnosis not present

## 2021-10-31 DIAGNOSIS — M129 Arthropathy, unspecified: Secondary | ICD-10-CM | POA: Diagnosis not present

## 2021-10-31 LAB — RAD ONC ARIA SESSION SUMMARY
Course Elapsed Days: 4
Plan Fractions Treated to Date: 5
Plan Prescribed Dose Per Fraction: 2 Gy
Plan Total Fractions Prescribed: 30
Plan Total Prescribed Dose: 60 Gy
Reference Point Dosage Given to Date: 10 Gy
Reference Point Session Dosage Given: 2 Gy
Session Number: 5

## 2021-10-31 MED ORDER — SONAFINE EX EMUL
1.0000 | Freq: Once | CUTANEOUS | Status: AC
  Administered 2021-10-31: 1 via TOPICAL

## 2021-10-31 MED FILL — Dexamethasone Sodium Phosphate Inj 100 MG/10ML: INTRAMUSCULAR | Qty: 1 | Status: AC

## 2021-11-03 ENCOUNTER — Encounter: Payer: Self-pay | Admitting: Internal Medicine

## 2021-11-03 ENCOUNTER — Inpatient Hospital Stay: Payer: Medicare HMO

## 2021-11-03 ENCOUNTER — Ambulatory Visit
Admission: RE | Admit: 2021-11-03 | Discharge: 2021-11-03 | Disposition: A | Discharge status: Home / Self Care | Payer: Medicare HMO | Source: Ambulatory Visit | Attending: Radiation Oncology | Admitting: Radiation Oncology

## 2021-11-03 ENCOUNTER — Other Ambulatory Visit: Payer: Self-pay

## 2021-11-03 ENCOUNTER — Inpatient Hospital Stay (HOSPITAL_BASED_OUTPATIENT_CLINIC_OR_DEPARTMENT_OTHER): Payer: Medicare HMO | Admitting: Internal Medicine

## 2021-11-03 DIAGNOSIS — C3411 Malignant neoplasm of upper lobe, right bronchus or lung: Secondary | ICD-10-CM | POA: Diagnosis not present

## 2021-11-03 DIAGNOSIS — R21 Rash and other nonspecific skin eruption: Secondary | ICD-10-CM | POA: Diagnosis not present

## 2021-11-03 DIAGNOSIS — Z87891 Personal history of nicotine dependence: Secondary | ICD-10-CM | POA: Diagnosis not present

## 2021-11-03 DIAGNOSIS — M129 Arthropathy, unspecified: Secondary | ICD-10-CM | POA: Diagnosis not present

## 2021-11-03 DIAGNOSIS — C3491 Malignant neoplasm of unspecified part of right bronchus or lung: Secondary | ICD-10-CM | POA: Diagnosis not present

## 2021-11-03 DIAGNOSIS — I700 Atherosclerosis of aorta: Secondary | ICD-10-CM | POA: Diagnosis not present

## 2021-11-03 DIAGNOSIS — K7689 Other specified diseases of liver: Secondary | ICD-10-CM | POA: Diagnosis not present

## 2021-11-03 DIAGNOSIS — I809 Phlebitis and thrombophlebitis of unspecified site: Secondary | ICD-10-CM | POA: Diagnosis not present

## 2021-11-03 DIAGNOSIS — Z510 Encounter for antineoplastic radiation therapy: Secondary | ICD-10-CM | POA: Diagnosis not present

## 2021-11-03 DIAGNOSIS — J449 Chronic obstructive pulmonary disease, unspecified: Secondary | ICD-10-CM | POA: Diagnosis not present

## 2021-11-03 DIAGNOSIS — Z5111 Encounter for antineoplastic chemotherapy: Secondary | ICD-10-CM | POA: Diagnosis not present

## 2021-11-03 LAB — RAD ONC ARIA SESSION SUMMARY
Course Elapsed Days: 7
Plan Fractions Treated to Date: 6
Plan Prescribed Dose Per Fraction: 2 Gy
Plan Total Fractions Prescribed: 30
Plan Total Prescribed Dose: 60 Gy
Reference Point Dosage Given to Date: 12 Gy
Reference Point Session Dosage Given: 2 Gy
Session Number: 6

## 2021-11-03 LAB — CBC WITH DIFFERENTIAL (CANCER CENTER ONLY)
Abs Immature Granulocytes: 0.03 10*3/uL (ref 0.00–0.07)
Basophils Absolute: 0 10*3/uL (ref 0.0–0.1)
Basophils Relative: 1 %
Eosinophils Absolute: 0.1 10*3/uL (ref 0.0–0.5)
Eosinophils Relative: 1 %
HCT: 39.2 % (ref 36.0–46.0)
Hemoglobin: 12.9 g/dL (ref 12.0–15.0)
Immature Granulocytes: 1 %
Lymphocytes Relative: 16 %
Lymphs Abs: 0.9 10*3/uL (ref 0.7–4.0)
MCH: 32.4 pg (ref 26.0–34.0)
MCHC: 32.9 g/dL (ref 30.0–36.0)
MCV: 98.5 fL (ref 80.0–100.0)
Monocytes Absolute: 0.5 10*3/uL (ref 0.1–1.0)
Monocytes Relative: 8 %
Neutro Abs: 4.1 10*3/uL (ref 1.7–7.7)
Neutrophils Relative %: 73 %
Platelet Count: 235 10*3/uL (ref 150–400)
RBC: 3.98 MIL/uL (ref 3.87–5.11)
RDW: 12 % (ref 11.5–15.5)
WBC Count: 5.6 10*3/uL (ref 4.0–10.5)
nRBC: 0 % (ref 0.0–0.2)

## 2021-11-03 LAB — CMP (CANCER CENTER ONLY)
ALT: 34 U/L (ref 0–44)
AST: 28 U/L (ref 15–41)
Albumin: 4.1 g/dL (ref 3.5–5.0)
Alkaline Phosphatase: 70 U/L (ref 38–126)
Anion gap: 5 (ref 5–15)
BUN: 10 mg/dL (ref 8–23)
CO2: 31 mmol/L (ref 22–32)
Calcium: 9.8 mg/dL (ref 8.9–10.3)
Chloride: 98 mmol/L (ref 98–111)
Creatinine: 0.51 mg/dL (ref 0.44–1.00)
GFR, Estimated: >60 mL/min (ref >60)
Glucose, Bld: 97 mg/dL (ref 70–99)
Potassium: 4.4 mmol/L (ref 3.5–5.1)
Sodium: 134 mmol/L — ABNORMAL LOW (ref 135–145)
Total Bilirubin: 0.4 mg/dL (ref 0.3–1.2)
Total Protein: 7 g/dL (ref 6.5–8.1)

## 2021-11-03 MED ORDER — SODIUM CHLORIDE 0.9 % IV SOLN
45.0000 mg/m2 | Freq: Once | INTRAVENOUS | Status: AC
  Administered 2021-11-03: 84 mg via INTRAVENOUS
  Filled 2021-11-03: qty 14

## 2021-11-03 MED ORDER — SODIUM CHLORIDE 0.9 % IV SOLN
10.0000 mg | Freq: Once | INTRAVENOUS | Status: AC
  Administered 2021-11-03: 10 mg via INTRAVENOUS
  Filled 2021-11-03: qty 10

## 2021-11-03 MED ORDER — CETIRIZINE HCL 10 MG/ML IV SOLN
10.0000 mg | Freq: Once | INTRAVENOUS | Status: AC
  Administered 2021-11-03: 10 mg via INTRAVENOUS
  Filled 2021-11-03: qty 1

## 2021-11-03 MED ORDER — PALONOSETRON HCL INJECTION 0.25 MG/5ML
0.2500 mg | Freq: Once | INTRAVENOUS | Status: AC
  Administered 2021-11-03: 0.25 mg via INTRAVENOUS
  Filled 2021-11-03: qty 5

## 2021-11-03 MED ORDER — SODIUM CHLORIDE 0.9 % IV SOLN
153.6000 mg | Freq: Once | INTRAVENOUS | Status: AC
  Administered 2021-11-03: 150 mg via INTRAVENOUS
  Filled 2021-11-03: qty 15

## 2021-11-03 MED ORDER — FAMOTIDINE IN NACL 20-0.9 MG/50ML-% IV SOLN
20.0000 mg | Freq: Once | INTRAVENOUS | Status: AC
  Administered 2021-11-03: 20 mg via INTRAVENOUS
  Filled 2021-11-03: qty 50

## 2021-11-03 MED ORDER — SODIUM CHLORIDE 0.9 % IV SOLN: Freq: Once | INTRAVENOUS | Status: AC

## 2021-11-03 NOTE — Progress Notes (Signed)
[place] [phone]   [phone]  OFFICE PROGRESS NOTE  Joanne Smoker, MD [facility] [id number]  DIAGNOSIS: Unresectable stage IIb (T1c, No/N1, M0) non-small cell lung cancer, adenocarcinoma.  The patient presented with right upper lobe groundglass lesion and hypermetabolic right hilar lymph node.  She was diagnosed in September 2023.   PRIOR THERAPY: None  CURRENT THERAPY: Concurrent chemoradiation with weekly carboplatin for AUC of 2 and paclitaxel 45 Mg/M2.  First dose October 27, 2021.  Status post 1 cycle.  INTERVAL HISTORY: Joanne Rogers 76 y.o. female returns to the clinic today for follow-up visit accompanied by her daughter Joanne Rogers.  The patient is feeling fine today with no concerning complaints except for mild rash under her breasts bilaterally.  She also has mild shortness of breath with exertion but no significant cough or hemoptysis.  She has no chest pain.  She has occasional epistaxis from dryness of the nose.  She denied having any recent weight loss or night sweats.  She has no nausea, vomiting, diarrhea or constipation.  She has no headache or visual changes.  She is here today for evaluation before starting cycle #2 of her treatment.  MEDICAL HISTORY: Past Medical History:  Diagnosis Date   Arthritis    back, hands, neck   COPD (chronic obstructive pulmonary disease) ([place])    Coronary artery disease 11/28/2019   CCTA 11/28/19: Mild non-obstructive CAD, myocardial bridge mid LAD (cardiologist Dr. Standley Dakins, 08/19/21)   DDD (degenerative disc disease), lumbar    Dyspnea    uses oxygen at home   H/O bronchitis    allergy related   Hypercholesterolemia    Oxygen dependent    Palpitations    "fluttering"   Pneumonia    hx of    ALLERGIES:  is allergic to codeine, amoxicillin, latex, levofloxacin, and statins.  MEDICATIONS:  Current Outpatient Medications  Medication Sig Dispense Refill    acetaminophen (TYLENOL) 500 MG tablet Take 1,000 mg by mouth every 6 (six) hours as needed for moderate pain.     albuterol (VENTOLIN HFA) 108 (90 Base) MCG/ACT inhaler Inhale 1-2 puffs into the lungs every 4 (four) hours as needed for wheezing or shortness of breath. 1 Inhaler 5   ascorbic acid (VITAMIN C) 500 MG tablet Take 500 mg by mouth daily.     aspirin 81 MG tablet Take 81 mg by mouth at bedtime.     azelastine (ASTELIN) 0.1 % nasal spray Place 1 spray into both nostrils as needed for rhinitis.     beta carotene w/minerals (OCUVITE) tablet Take 1 tablet by mouth at bedtime.     bisoprolol (ZEBETA) 5 MG tablet Take 1 tablet (5 mg total) by mouth daily. 90 tablet 3   cetirizine (ZYRTEC) 10 MG tablet Take 10 mg by mouth at bedtime.     cholecalciferol (VITAMIN D3) 25 MCG (1000 UNIT) tablet Take 1,000 Units by mouth daily.     Cyanocobalamin (B-12) 2500 MCG TABS Take 2,500 mcg by mouth daily.     famotidine (PEPCID) 20 MG tablet One at bedtime 30 tablet 11   ibuprofen (ADVIL) 200 MG tablet Take 400 mg by mouth every 6 (six) hours as needed for moderate pain.     ipratropium-albuterol (DUONEB) 0.5-2.5 (3) MG/3ML SOLN Take 3 mLs by nebulization every 4 (four) hours. Dx: J44.9 360 mL 1   Multiple Minerals-Vitamins (CAL MAG ZINC +D3) TABS Take 1-2 tablets by  mouth See admin instructions. Take 1 tablet in the morning and 2 tablets in the evening     OXYGEN 2lpm with sleep     Polyethyl Glycol-Propyl Glycol (SYSTANE OP) Place 1 drop into both eyes daily as needed (dry eyes).     prochlorperazine (COMPAZINE) 10 MG tablet Take 1 tablet (10 mg total) by mouth every 6 (six) hours as needed. 30 tablet 2   Respiratory Therapy Supplies (FLUTTER) DEVI 1 Device by Does not apply route as needed. 1 each 0   SYMBICORT 160-4.5 MCG/ACT inhaler Inhale 2 puffs into the lungs in the morning and at bedtime. 10.2 g 11   Tiotropium Bromide Monohydrate (SPIRIVA RESPIMAT) 2.5 MCG/ACT AERS Inhale 2 puffs into the  lungs daily. 4 g 0   valACYclovir (VALTREX) 1000 MG tablet Take 500 mg by mouth every other day.      No current facility-administered medications for this visit.    SURGICAL HISTORY:  Past Surgical History:  Procedure Laterality Date   BRONCHIAL BIOPSY  09/29/2021   Procedure: BRONCHIAL BIOPSIES;  Surgeon: Collene Gobble, MD;  Location: [facility] ENDOSCOPY;  Service: Pulmonary;;   BRONCHIAL BRUSHINGS  09/29/2021   Procedure: BRONCHIAL BRUSHINGS;  Surgeon: Collene Gobble, MD;  Location: [facility] ENDOSCOPY;  Service: Pulmonary;;   BRONCHIAL NEEDLE ASPIRATION BIOPSY  09/29/2021   Procedure: BRONCHIAL NEEDLE ASPIRATION BIOPSIES;  Surgeon: Collene Gobble, MD;  Location: [facility] ENDOSCOPY;  Service: Pulmonary;;   CARDIOVASCULAR STRESS TEST  11/16/2005   EF 70%, NO ISCHEMIA   CARPAL TUNNEL RELEASE Right ~1988   DILATION AND CURETTAGE OF UTERUS     x3: x2 for miscarrages and x1 for polyp   HEMI-MICRODISCECTOMY LUMBAR LAMINECTOMY LEVEL 1 Left 07/20/2014   Procedure: centeral decompression,L4-L5, HEMI-LAMINECTOMY MICRODISCECTOMY L4 - L5 ON THE LEFT  LEVEL 1, forimatomy 4-5 root on the left;  Surgeon: Latanya Maudlin, MD;  Location: [facility] ORS;  Service: Orthopedics;  Laterality: Left;   US ECHOCARDIOGRAPHY  11/19/2005   EF 55-60%   VIDEO BRONCHOSCOPY WITH ENDOBRONCHIAL ULTRASOUND Right 09/29/2021   Procedure: VIDEO BRONCHOSCOPY WITH ENDOBRONCHIAL ULTRASOUND;  Surgeon: Collene Gobble, MD;  Location[facility] ENDOSCOPY;  Service: Pulmonary;  Laterality: Right;   VIDEO BRONCHOSCOPY WITH RADIAL ENDOBRONCHIAL ULTRASOUND  09/29/2021   Procedure: VIDEO BRONCHOSCOPY WITH RADIAL ENDOBRONCHIAL ULTRASOUND;  Surgeon: Collene Gobble, MD;  Location: [facility] ENDOSCOPY;  Service: Pulmonary;;    REVIEW OF SYSTEMS:  A comprehensive review of systems was negative except for: Constitutional: positive for fatigue Ears, nose, mouth, throat, and face: positive for epistaxis   PHYSICAL EXAMINATION: General appearance: alert, cooperative, fatigued, and  no distress Head: Normocephalic, without obvious abnormality, atraumatic Neck: no adenopathy, no JVD, supple, symmetrical, trachea midline, and thyroid not enlarged, symmetric, no tenderness/mass/nodules Lymph nodes: Cervical, supraclavicular, and axillary nodes normal. Resp: clear to auscultation bilaterally Back: symmetric, no curvature. ROM normal. No CVA tenderness. Cardio: regular rate and rhythm, S1, S2 normal, no murmur, click, rub or gallop GI: soft, non-tender; bowel sounds normal; no masses,  no organomegaly Extremities: extremities normal, atraumatic, no cyanosis or edema  ECOG PERFORMANCE STATUS: 1 - Symptomatic but completely ambulatory  Blood pressure (!) 137/57, pulse 71, temperature (!) 97.5 F (36.4 C), temperature source Oral, resp. rate 16, height 5\' 6"  (1.676 m), weight 156 lb 1 oz (70.8 kg), SpO2 93 %.  LABORATORY DATA: Lab Results  Component Value Date   WBC 5.6 11/03/2021   HGB 12.9 11/03/2021   HCT 39.2 11/03/2021   MCV 98.5 11/03/2021  PLT 235 11/03/2021      Chemistry      Component Value Date/Time   NA 134 (L) 11/03/2021 1104   NA 137 05/08/2020 0909   K 4.4 11/03/2021 1104   CL 98 11/03/2021 1104   CO2 31 11/03/2021 1104   BUN 10 11/03/2021 1104   BUN 8 05/08/2020 0909   CREATININE 0.51 11/03/2021 1104      Component Value Date/Time   CALCIUM 9.8 11/03/2021 1104   ALKPHOS 70 11/03/2021 1104   AST 28 11/03/2021 1104   ALT 34 11/03/2021 1104   BILITOT 0.4 11/03/2021 1104       RADIOGRAPHIC STUDIES: MM 3D SCREEN BREAST BILATERAL  Result Date: 10/20/2021 CLINICAL DATA:  Screening. EXAM: DIGITAL SCREENING BILATERAL MAMMOGRAM WITH TOMOSYNTHESIS AND CAD TECHNIQUE: Bilateral screening digital craniocaudal and mediolateral oblique mammograms were obtained. Bilateral screening digital breast tomosynthesis was performed. The images were evaluated with computer-aided detection. COMPARISON:  Previous exam(s). ACR Breast Density Category c: The breast  tissue is heterogeneously dense, which may obscure small masses. FINDINGS: There are no findings suspicious for malignancy. IMPRESSION: No mammographic evidence of malignancy. A result letter of this screening mammogram will be mailed directly to the patient. RECOMMENDATION: Screening mammogram in one year. (Code:SM-B-01Y) BI-RADS CATEGORY  1: Negative. Electronically Signed   By: Kristopher Oppenheim M.D.   On: 10/20/2021 10:13   MR Brain W Wo Contrast  Result Date: 10/17/2021 CLINICAL DATA:  Non-small-cell lung cancer staging. EXAM: MRI HEAD WITHOUT AND WITH CONTRAST TECHNIQUE: Multiplanar, multiecho pulse sequences of the brain and surrounding structures were obtained without and with intravenous contrast. CONTRAST:  7 ml V[employer]way COMPARISON:  None Available. FINDINGS: Brain: No acute infarction, hemorrhage, hydrocephalus, extra-axial collection or mass lesion. There is a small focus of microhemorrhage in the posterior left frontal lobe (series 10, image 49). Small punctate focus of contrast enhancement in the gyrus rectus (series 16, image 70) is favored to represent vascular enhancement. Vascular: Normal flow voids. Skull and upper cervical spine: Normal marrow signal. Sinuses/Orbits: Trace left mastoid effusion. Bilateral lens replacements. Other: None. IMPRESSION: No evidence of intracranial metastatic disease. Electronically Signed   By: Marin Roberts M.D.   On: 10/17/2021 09:33    ASSESSMENT AND PLAN: This is a very pleasant 76 years old white female with Unresectable stage IIb (T1c, No/N1, M0) non-small cell lung cancer, adenocarcinoma.  The patient presented with right upper lobe groundglass lesion and hypermetabolic right hilar lymph node.  She was diagnosed in September 2023. The patient is currently undergoing a course of concurrent chemoradiation with weekly carboplatin for AUC of 2 and paclitaxel 45 Mg/M2 status post 1 cycle. She tolerated the first cycle of her treatment fairly well.  She has mild  skin rash under the breast and I asked her to apply over-the-counter hydrocortisone cream to this area. She also has few episodes of epistaxis that has been going on even before the treatment secondary to dryness of her nose and I advised her to use over-the-counter saline nasal spray. The patient was advised to proceed with cycle #2 today as planned. I will see the patient back for follow-up visit in 2 weeks for evaluation and management of any adverse effect of her treatment. She was advised to call immediately if she has any concerning symptoms in the interval. The patient voices understanding of current disease status and treatment options and is in agreement with the current care plan.  All questions were answered. The patient knows to call the clinic  with any problems, questions or concerns. We can certainly see the patient much sooner if necessary.  The total time spent in the appointment was 20 minutes.  Disclaimer: This note was dictated with voice recognition software. Similar sounding words can inadvertently be transcribed and may not be corrected upon review.

## 2021-11-04 ENCOUNTER — Other Ambulatory Visit: Payer: Self-pay

## 2021-11-04 ENCOUNTER — Ambulatory Visit
Admission: RE | Admit: 2021-11-04 | Discharge: 2021-11-04 | Disposition: A | Discharge status: Home / Self Care | Payer: Medicare HMO | Source: Ambulatory Visit | Attending: Radiation Oncology | Admitting: Radiation Oncology

## 2021-11-04 DIAGNOSIS — M129 Arthropathy, unspecified: Secondary | ICD-10-CM | POA: Diagnosis not present

## 2021-11-04 DIAGNOSIS — I700 Atherosclerosis of aorta: Secondary | ICD-10-CM | POA: Diagnosis not present

## 2021-11-04 DIAGNOSIS — Z87891 Personal history of nicotine dependence: Secondary | ICD-10-CM | POA: Diagnosis not present

## 2021-11-04 DIAGNOSIS — I809 Phlebitis and thrombophlebitis of unspecified site: Secondary | ICD-10-CM | POA: Diagnosis not present

## 2021-11-04 DIAGNOSIS — J449 Chronic obstructive pulmonary disease, unspecified: Secondary | ICD-10-CM | POA: Diagnosis not present

## 2021-11-04 DIAGNOSIS — R21 Rash and other nonspecific skin eruption: Secondary | ICD-10-CM | POA: Diagnosis not present

## 2021-11-04 DIAGNOSIS — C3411 Malignant neoplasm of upper lobe, right bronchus or lung: Secondary | ICD-10-CM | POA: Diagnosis not present

## 2021-11-04 DIAGNOSIS — Z5111 Encounter for antineoplastic chemotherapy: Secondary | ICD-10-CM | POA: Diagnosis not present

## 2021-11-04 DIAGNOSIS — K7689 Other specified diseases of liver: Secondary | ICD-10-CM | POA: Diagnosis not present

## 2021-11-04 DIAGNOSIS — Z510 Encounter for antineoplastic radiation therapy: Secondary | ICD-10-CM | POA: Diagnosis not present

## 2021-11-04 LAB — RAD ONC ARIA SESSION SUMMARY
Course Elapsed Days: 8
Plan Fractions Treated to Date: 7
Plan Prescribed Dose Per Fraction: 2 Gy
Plan Total Fractions Prescribed: 30
Plan Total Prescribed Dose: 60 Gy
Reference Point Dosage Given to Date: 14 Gy
Reference Point Session Dosage Given: 2 Gy
Session Number: 7

## 2021-11-05 ENCOUNTER — Ambulatory Visit
Admission: RE | Admit: 2021-11-05 | Discharge: 2021-11-05 | Disposition: A | Discharge status: Home / Self Care | Payer: Medicare HMO | Source: Ambulatory Visit | Attending: Radiation Oncology | Admitting: Radiation Oncology

## 2021-11-05 ENCOUNTER — Inpatient Hospital Stay (HOSPITAL_BASED_OUTPATIENT_CLINIC_OR_DEPARTMENT_OTHER): Payer: Medicare HMO | Admitting: Physician Assistant

## 2021-11-05 ENCOUNTER — Other Ambulatory Visit: Payer: Self-pay

## 2021-11-05 VITALS — BP 137/60 | HR 70 | Temp 98.7°F | Resp 18

## 2021-11-05 DIAGNOSIS — M129 Arthropathy, unspecified: Secondary | ICD-10-CM | POA: Diagnosis not present

## 2021-11-05 DIAGNOSIS — C3411 Malignant neoplasm of upper lobe, right bronchus or lung: Secondary | ICD-10-CM | POA: Diagnosis not present

## 2021-11-05 DIAGNOSIS — C3491 Malignant neoplasm of unspecified part of right bronchus or lung: Secondary | ICD-10-CM

## 2021-11-05 DIAGNOSIS — R21 Rash and other nonspecific skin eruption: Secondary | ICD-10-CM | POA: Diagnosis not present

## 2021-11-05 DIAGNOSIS — Z87891 Personal history of nicotine dependence: Secondary | ICD-10-CM | POA: Diagnosis not present

## 2021-11-05 DIAGNOSIS — R229 Localized swelling, mass and lump, unspecified: Secondary | ICD-10-CM | POA: Diagnosis not present

## 2021-11-05 DIAGNOSIS — Z5111 Encounter for antineoplastic chemotherapy: Secondary | ICD-10-CM | POA: Diagnosis not present

## 2021-11-05 DIAGNOSIS — J449 Chronic obstructive pulmonary disease, unspecified: Secondary | ICD-10-CM | POA: Diagnosis not present

## 2021-11-05 DIAGNOSIS — I809 Phlebitis and thrombophlebitis of unspecified site: Secondary | ICD-10-CM | POA: Diagnosis not present

## 2021-11-05 DIAGNOSIS — I700 Atherosclerosis of aorta: Secondary | ICD-10-CM | POA: Diagnosis not present

## 2021-11-05 DIAGNOSIS — Z510 Encounter for antineoplastic radiation therapy: Secondary | ICD-10-CM | POA: Diagnosis not present

## 2021-11-05 DIAGNOSIS — K7689 Other specified diseases of liver: Secondary | ICD-10-CM | POA: Diagnosis not present

## 2021-11-05 LAB — RAD ONC ARIA SESSION SUMMARY
Course Elapsed Days: 9
Plan Fractions Treated to Date: 8
Plan Prescribed Dose Per Fraction: 2 Gy
Plan Total Fractions Prescribed: 30
Plan Total Prescribed Dose: 60 Gy
Reference Point Dosage Given to Date: 16 Gy
Reference Point Session Dosage Given: 2 Gy
Session Number: 8

## 2021-11-05 NOTE — Progress Notes (Signed)
Symptom Management Consult note [place]    Patient Care Team: [name], MD as PCP - General (Family Medicine) Berniece Salines, DO as PCP - Cardiology (Cardiology) Barney Drain, DMD (Dentistry) Barney Drain, DMD (Dentistry) Tanda Rockers, MD as Consulting Physician (Pulmonary Disease)    Name of the patient: Joanne Rogers  [id number]  11/28/1945   Date of visit: 11/05/2021   Chief Complaint/Reason for visit: swelling on arm   Current Therapy: carboplatin and paclitaxel  Last treatment:  Day 1   Cycle 2 on 11/03/21   ASSESSMENT & PLAN: Patient is a 76 y.o. female  with oncologic history of  stage IIb (T1c, No/N1, M0) non-small cell lung cancer, adenocarcinoma  followed by Dr. Julien Nordmann.  I have viewed most recent oncology note and lab work.    #) Stage IIb (T1c, No/N1, M0) non-small cell lung cancer, adenocarcinoma  -Has bruising from IV and labs last treatment. Denies any abnormal bleeding. -Next treatment is scheduled for 11/10/21. Next appointment with oncologist is 11/17/21.    #) Phlebitis Patient with likely phlebitis from Ivs during chemo. Has had "bumps pop up" after last two treatments.  -Exam consistent w/ superficial phlebitis. Discussed symptomatic care including warm compresses, elevation, and tylenol for pain. -Offered patient to order port placement which she declines at this time. -Chart review shows CBC from 11/03/21 with normal platelets. - Strict ED precautions discussed should symptoms worsen.       Heme/Onc History: Oncology History  Adenocarcinoma of right lung, stage 2 ([place])  10/16/2021 Initial Diagnosis   Adenocarcinoma of right lung, stage 2 ([place])   10/27/2021 -  Chemotherapy   Patient is on Treatment Plan : LUNG Carboplatin + Paclitaxel + XRT q7d         Interval history-: Joanne Rogers is a 76 y.o. female with oncologic history as above presenting to [facility] today with chief complaint of swelling  and bruising on her right arm x 2 days. Patient had treatment x 3 days ago. She states they had a difficult time getting her blood work prior to treatment. She denise any pain during her treatment at IV site. Later that evening she noticed bruising on her forearm where IV had been and a small bump. The bump is only sore when touched. Pain does not radiate it. She reports a bump happened on her left arm after her first treatment. She applied ice to it the first day, otherwise has not tried any symptomatic care. Besides the bruise on her arm she denies any abnormal bruising or bleeding. She takes daily baby aspirin. Denies history of PE or DVT. Denies fever, chills, chest pain, hemoptysis, numbness, tingling, swelling.      ROS  All other systems are reviewed and are negative for acute change except as noted in the HPI.    Allergies  Allergen Reactions   Codeine Hives   Amoxicillin Nausea And Vomiting   Latex Hives and Rash   Levofloxacin Nausea Only   Statins Other (See Comments)    Muscle aches     Past Medical History:  Diagnosis Date   Arthritis    back, hands, neck   COPD (chronic obstructive pulmonary disease) (HCC)    Coronary artery disease 11/28/2019   CCTA 11/28/19: Mild non-obstructive CAD, myocardial bridge mid LAD (cardiologist Dr. Standley Dakins, 08/19/21)   DDD (degenerative disc disease), lumbar    Dyspnea    uses oxygen at home   H/O  bronchitis    allergy related   Hypercholesterolemia    Oxygen dependent    Palpitations    "fluttering"   Pneumonia    hx of     Past Surgical History:  Procedure Laterality Date   BRONCHIAL BIOPSY  09/29/2021   Procedure: BRONCHIAL BIOPSIES;  Surgeon: Collene Gobble, MD;  Location: [facility];  Service: Pulmonary;;   BRONCHIAL BRUSHINGS  09/29/2021   Procedure: BRONCHIAL BRUSHINGS;  Surgeon: Collene Gobble, MD;  Location: [facility] END[facility]COPY;  Service: Pulmonary;;   BRONCHIAL NEEDLE ASPIRATION BIOPSY  09/29/2021   Procedure:  BRONCHIAL NEEDLE ASPIRATION BIOPSIES;  Surgeon: Collene Gobble, MD;  Location: [facility];  Service: Pulmonary;;   CARDIOVASCULAR STRESS TEST  11/16/2005   EF 70%, NO ISCHEMIA   CARPAL TUNNEL RELEASE Right ~1988   DILATION AND CURETTAGE OF UTERUS     x3: x2 for miscarrages and x1 for polyp   HEMI-MICRODISCECTOMY LUMBAR LAMINECTOMY LEVEL 1 Left 07/20/2014   Procedure: centeral decompression,L4-L5, HEMI-LAMINECTOMY MICRODISCECTOMY L4 - L5 ON THE LEFT  LEVEL 1, forimatomy 4-5 root on the left;  Surgeon: Latanya Maudlin, MD;  Location: [facility];  Service: Orthopedics;  Laterality: Left;   US ECHOCARDIOGRAPHY  11/19/2005   EF 55-60%   VIDEO BRONCHOSCOPY WITH ENDOBRONCHIAL ULTRASOUND Right 09/29/2021   Procedure: VIDEO BRONCHOSCOPY WITH ENDOBRONCHIAL ULTRASOUND;  Surgeon: Collene Gobble, MD;  Location: [facility] ENDOSCOPY;  Service: Pulmonary;  Laterality: Right;   VIDEO BRONCHOSCOPY WITH RADIAL ENDOBRONCHIAL ULTRASOUND  09/29/2021   Procedure: VIDEO BRONCHOSCOPY WITH RADIAL ENDOBRONCHIAL ULTRASOUND;  Surgeon: Collene Gobble, MD;  Location: [facility] ENDOSCOPY;  Service: Pulmonary;;    Social History   Socioeconomic History   Marital status: Widowed    Spouse name: Not on file   Number of children: Not on file   Years of education: Not on file   Highest education level: Not on file  Occupational History   Occupation: caregiver  Tobacco Use   Smoking status: Former    Packs/day: 0.50    Years: 25.00    Total pack years: 12.50    Types: Cigarettes    Quit date: 05/16/2005    Years since quitting: 16.4   Smokeless tobacco: Never  Vaping Use   Vaping Use: Never used  Substance and Sexual Activity   Alcohol use: Yes    Alcohol/week: 6.0 standard drinks of alcohol    Types: 6 Standard drinks or equivalent per week    Comment: social beer   Drug use: No   Sexual activity: Not Currently  Other Topics Concern   Not on file  Social History Narrative   Children   Lives alone   Denies caffeine use.    Social Determinants of Health   Financial Resource Strain: Not on file  Food Insecurity: Not on file  Transportation Needs: Not on file  Physical Activity: Not on file  Stress: Not on file  Social Connections: Not on file  Intimate Partner Violence: Not on file    Family History  Problem Relation Age of Onset   Breast cancer Mother 66   Emphysema Maternal Grandfather      Current Outpatient Medications:    acetaminophen (TYLENOL) 500 MG tablet, Take 1,000 mg by mouth every 6 (six) hours as needed for moderate pain., Disp: , Rfl:    albuterol (VENTOLIN HFA) 108 (90 Base) MCG/ACT inhaler, Inhale 1-2 puffs into the lungs every 4 (four) hours as needed for wheezing or shortness of breath., Disp: 1 Inhaler, Rfl: 5  ascorbic acid (VITAMIN C) 500 MG tablet, Take 500 mg by mouth daily., Disp: , Rfl:    aspirin 81 MG tablet, Take 81 mg by mouth at bedtime., Disp: , Rfl:    azelastine (ASTELIN) 0.1 % nasal spray, Place 1 spray into both nostrils as needed for rhinitis., Disp: , Rfl:    beta carotene w/minerals (OCUVITE) tablet, Take 1 tablet by mouth at bedtime., Disp: , Rfl:    bisoprolol (ZEBETA) 5 MG tablet, Take 1 tablet (5 mg total) by mouth daily., Disp: 90 tablet, Rfl: 3   cetirizine (ZYRTEC) 10 MG tablet, Take 10 mg by mouth at bedtime., Disp: , Rfl:    cholecalciferol (VITAMIN D3) 25 MCG (1000 UNIT) tablet, Take 1,000 Units by mouth daily., Disp: , Rfl:    Cyanocobalamin (B-12) 2500 MCG TABS, Take 2,500 mcg by mouth daily., Disp: , Rfl:    famotidine (PEPCID) 20 MG tablet, One at bedtime, Disp: 30 tablet, Rfl: 11   ibuprofen (ADVIL) 200 MG tablet, Take 400 mg by mouth every 6 (six) hours as needed for moderate pain., Disp: , Rfl:    ipratropium-albuterol (DUONEB) 0.5-2.5 (3) MG/3ML SOLN, Take 3 mLs by nebulization every 4 (four) hours. Dx: J44.9, Disp: 360 mL, Rfl: 1   Multiple Minerals-Vitamins (CAL MAG ZINC +D3) TABS, Take 1-2 tablets by mouth See admin instructions. Take 1  tablet in the morning and 2 tablets in the evening, Disp: , Rfl:    OXYGEN, 2lpm with sleep, Disp: , Rfl:    Polyethyl Glycol-Propyl Glycol (SYSTANE OP), Place 1 drop into both eyes daily as needed (dry eyes)., Disp: , Rfl:    prochlorperazine (COMPAZINE) 10 MG tablet, Take 1 tablet (10 mg total) by mouth every 6 (six) hours as needed., Disp: 30 tablet, Rfl: 2   Respiratory Therapy Supplies (FLUTTER) DEVI, 1 Device by Does not apply route as needed., Disp: 1 each, Rfl: 0   SYMBICORT 160-4.5 MCG/ACT inhaler, Inhale 2 puffs into the lungs in the morning and at bedtime., Disp: 10.2 g, Rfl: 11   Tiotropium Bromide Monohydrate (SPIRIVA RESPIMAT) 2.5 MCG/ACT AERS, Inhale 2 puffs into the lungs daily., Disp: 4 g, Rfl: 0   valACYclovir (VALTREX) 1000 MG tablet, Take 500 mg by mouth every other day. , Disp: , Rfl:   PHYSICAL EXAM: ECOG FS:1 - Symptomatic but completely ambulatory    Vitals:   11/05/21 1303  BP: 137/60  Pulse: 70  Resp: 18  Temp: 98.7 F (37.1 C)  SpO2: 99%   Physical Exam Vitals and nursing note reviewed.  Constitutional:      Appearance: She is well-developed. She is not ill-appearing or toxic-appearing.  HENT:     Head: Normocephalic.     Nose: Nose normal.  Eyes:     Conjunctiva/sclera: Conjunctivae normal.  Neck:     Vascular: No JVD.  Cardiovascular:     Rate and Rhythm: Normal rate and regular rhythm.     Pulses: Normal pulses.          Radial pulses are 2+ on the right side and 2+ on the left side.     Heart sounds: Normal heart sounds.  Pulmonary:     Effort: Pulmonary effort is normal.     Breath sounds: Normal breath sounds.  Abdominal:     General: There is no distension.  Musculoskeletal:        General: Normal range of motion.     Cervical back: Normal range of motion.     Right  lower leg: No edema.     Left lower leg: No edema.     Comments: Compartments in bilateral upper extremities are soft.  Skin:    General: Skin is warm and dry.      Capillary Refill: Capillary refill takes less than 2 seconds.     Findings: Bruising present.     Comments: Ecchymosis to dorsum of right hand and wrist. Approx 1 x 1 cm area of induration without fluctuance palpated. No gross abscess.  Neurological:     Mental Status: She is oriented to person, place, and time.        LABORATORY DATA: I have reviewed the data as listed    Latest Ref Rng & Units 11/03/2021   11:04 AM 10/27/2021   11:38 AM 10/09/2021    1:13 PM  CBC  WBC 4.0 - 10.5 K/uL 5.6  8.1  8.9   Hemoglobin 12.0 - 15.0 g/dL 12.9  12.6  13.2   Hematocrit 36.0 - 46.0 % 39.2  37.5  39.3   Platelets 150 - 400 K/uL 235  233  244         Latest Ref Rng & Units 11/03/2021   11:04 AM 10/27/2021   11:38 AM 10/09/2021    1:13 PM  CMP  Glucose 70 - 99 mg/dL 97  119  100   BUN 8 - 23 mg/dL 10  10  12    Creatinine 0.44 - 1.00 mg/dL 0.51  0.52  0.50   Sodium 135 - 145 mmol/L 134  133  136   Potassium 3.5 - 5.1 mmol/L 4.4  4.4  4.5   Chloride 98 - 111 mmol/L 98  98  96   CO2 22 - 32 mmol/L 31  31  34   Calcium 8.9 - 10.3 mg/dL 9.8  9.5  9.9   Total Protein 6.5 - 8.1 g/dL 7.0  7.1  7.2   Total Bilirubin 0.3 - 1.2 mg/dL 0.4  0.5  0.4   Alkaline Phos 38 - 126 U/L 70  82  77   AST 15 - 41 U/L 28  26  22    ALT 0 - 44 U/L 34  26  23        RADIOGRAPHIC STUDIES (from last 24 hours if applicable) I have personally reviewed the radiological images as listed and agreed with the findings in the report. No results found.      Visit Diagnosis: 1. Adenocarcinoma of right lung ([place])   2. Swelling at injection site      No orders of the defined types were placed in this encounter.   All questions were answered. The patient knows to call the clinic with any problems, questions or concerns. No barriers to learning was detected.  I have spent a total of 20 minutes minutes of face-to-face and non-face-to-face time, preparing to see the patient, obtaining and/or reviewing separately  obtained history, performing a medically appropriate examination, counseling and educating the patient, documenting clinical information in the electronic health record, and care coordination (communications with other health care professionals or caregivers).    Thank you for allowing me to participate in the care of this patient.    Barrie Folk, PA-C Department of Hematology/Oncology [facility] at [facility] Phone: [phone]  [phone]    11/05/2021 4:00 PM

## 2021-11-06 ENCOUNTER — Other Ambulatory Visit: Payer: Self-pay

## 2021-11-06 ENCOUNTER — Encounter (HOSPITAL_COMMUNITY): Payer: Self-pay

## 2021-11-06 ENCOUNTER — Ambulatory Visit
Admission: RE | Admit: 2021-11-06 | Discharge: 2021-11-06 | Disposition: A | Discharge status: Home / Self Care | Payer: Medicare HMO | Source: Ambulatory Visit | Attending: Radiation Oncology | Admitting: Radiation Oncology

## 2021-11-06 DIAGNOSIS — I809 Phlebitis and thrombophlebitis of unspecified site: Secondary | ICD-10-CM | POA: Diagnosis not present

## 2021-11-06 DIAGNOSIS — M129 Arthropathy, unspecified: Secondary | ICD-10-CM | POA: Diagnosis not present

## 2021-11-06 DIAGNOSIS — R21 Rash and other nonspecific skin eruption: Secondary | ICD-10-CM | POA: Diagnosis not present

## 2021-11-06 DIAGNOSIS — Z5111 Encounter for antineoplastic chemotherapy: Secondary | ICD-10-CM | POA: Diagnosis not present

## 2021-11-06 DIAGNOSIS — I700 Atherosclerosis of aorta: Secondary | ICD-10-CM | POA: Diagnosis not present

## 2021-11-06 DIAGNOSIS — K7689 Other specified diseases of liver: Secondary | ICD-10-CM | POA: Diagnosis not present

## 2021-11-06 DIAGNOSIS — J449 Chronic obstructive pulmonary disease, unspecified: Secondary | ICD-10-CM | POA: Diagnosis not present

## 2021-11-06 DIAGNOSIS — Z87891 Personal history of nicotine dependence: Secondary | ICD-10-CM | POA: Diagnosis not present

## 2021-11-06 DIAGNOSIS — Z510 Encounter for antineoplastic radiation therapy: Secondary | ICD-10-CM | POA: Diagnosis not present

## 2021-11-06 DIAGNOSIS — C3411 Malignant neoplasm of upper lobe, right bronchus or lung: Secondary | ICD-10-CM | POA: Diagnosis not present

## 2021-11-06 LAB — RAD ONC ARIA SESSION SUMMARY
Course Elapsed Days: 10
Plan Fractions Treated to Date: 9
Plan Prescribed Dose Per Fraction: 2 Gy
Plan Total Fractions Prescribed: 30
Plan Total Prescribed Dose: 60 Gy
Reference Point Dosage Given to Date: 18 Gy
Reference Point Session Dosage Given: 2 Gy
Session Number: 9

## 2021-11-07 ENCOUNTER — Ambulatory Visit
Admission: RE | Admit: 2021-11-07 | Discharge: 2021-11-07 | Disposition: A | Discharge status: Home / Self Care | Payer: Medicare HMO | Source: Ambulatory Visit | Attending: Radiation Oncology | Admitting: Radiation Oncology

## 2021-11-07 ENCOUNTER — Other Ambulatory Visit: Payer: Self-pay

## 2021-11-07 DIAGNOSIS — Z510 Encounter for antineoplastic radiation therapy: Secondary | ICD-10-CM | POA: Diagnosis not present

## 2021-11-07 DIAGNOSIS — I700 Atherosclerosis of aorta: Secondary | ICD-10-CM | POA: Diagnosis not present

## 2021-11-07 DIAGNOSIS — Z87891 Personal history of nicotine dependence: Secondary | ICD-10-CM | POA: Diagnosis not present

## 2021-11-07 DIAGNOSIS — C3411 Malignant neoplasm of upper lobe, right bronchus or lung: Secondary | ICD-10-CM | POA: Diagnosis not present

## 2021-11-07 DIAGNOSIS — I809 Phlebitis and thrombophlebitis of unspecified site: Secondary | ICD-10-CM | POA: Diagnosis not present

## 2021-11-07 DIAGNOSIS — Z5111 Encounter for antineoplastic chemotherapy: Secondary | ICD-10-CM | POA: Diagnosis not present

## 2021-11-07 DIAGNOSIS — K7689 Other specified diseases of liver: Secondary | ICD-10-CM | POA: Diagnosis not present

## 2021-11-07 DIAGNOSIS — J449 Chronic obstructive pulmonary disease, unspecified: Secondary | ICD-10-CM | POA: Diagnosis not present

## 2021-11-07 DIAGNOSIS — R21 Rash and other nonspecific skin eruption: Secondary | ICD-10-CM | POA: Diagnosis not present

## 2021-11-07 DIAGNOSIS — M129 Arthropathy, unspecified: Secondary | ICD-10-CM | POA: Diagnosis not present

## 2021-11-07 LAB — RAD ONC ARIA SESSION SUMMARY
Course Elapsed Days: 11
Plan Fractions Treated to Date: 10
Plan Prescribed Dose Per Fraction: 2 Gy
Plan Total Fractions Prescribed: 30
Plan Total Prescribed Dose: 60 Gy
Reference Point Dosage Given to Date: 20 Gy
Reference Point Session Dosage Given: 2 Gy
Session Number: 10

## 2021-11-07 MED FILL — Dexamethasone Sodium Phosphate Inj 100 MG/10ML: INTRAMUSCULAR | Qty: 1 | Status: AC

## 2021-11-10 ENCOUNTER — Other Ambulatory Visit: Payer: Self-pay

## 2021-11-10 ENCOUNTER — Inpatient Hospital Stay: Payer: Medicare HMO

## 2021-11-10 ENCOUNTER — Ambulatory Visit
Admission: RE | Admit: 2021-11-10 | Discharge: 2021-11-10 | Disposition: A | Discharge status: Home / Self Care | Payer: Medicare HMO | Source: Ambulatory Visit | Attending: Radiation Oncology | Admitting: Radiation Oncology

## 2021-11-10 VITALS — BP 138/72 | HR 62 | Temp 98.2°F | Resp 18

## 2021-11-10 DIAGNOSIS — J449 Chronic obstructive pulmonary disease, unspecified: Secondary | ICD-10-CM | POA: Diagnosis not present

## 2021-11-10 DIAGNOSIS — I809 Phlebitis and thrombophlebitis of unspecified site: Secondary | ICD-10-CM | POA: Diagnosis not present

## 2021-11-10 DIAGNOSIS — Z87891 Personal history of nicotine dependence: Secondary | ICD-10-CM | POA: Diagnosis not present

## 2021-11-10 DIAGNOSIS — R21 Rash and other nonspecific skin eruption: Secondary | ICD-10-CM | POA: Diagnosis not present

## 2021-11-10 DIAGNOSIS — K7689 Other specified diseases of liver: Secondary | ICD-10-CM | POA: Diagnosis not present

## 2021-11-10 DIAGNOSIS — I700 Atherosclerosis of aorta: Secondary | ICD-10-CM | POA: Diagnosis not present

## 2021-11-10 DIAGNOSIS — C3491 Malignant neoplasm of unspecified part of right bronchus or lung: Secondary | ICD-10-CM

## 2021-11-10 DIAGNOSIS — M129 Arthropathy, unspecified: Secondary | ICD-10-CM | POA: Diagnosis not present

## 2021-11-10 DIAGNOSIS — Z5111 Encounter for antineoplastic chemotherapy: Secondary | ICD-10-CM | POA: Diagnosis not present

## 2021-11-10 DIAGNOSIS — C3411 Malignant neoplasm of upper lobe, right bronchus or lung: Secondary | ICD-10-CM | POA: Diagnosis not present

## 2021-11-10 DIAGNOSIS — Z510 Encounter for antineoplastic radiation therapy: Secondary | ICD-10-CM | POA: Diagnosis not present

## 2021-11-10 LAB — RAD ONC ARIA SESSION SUMMARY
Course Elapsed Days: 14
Plan Fractions Treated to Date: 11
Plan Prescribed Dose Per Fraction: 2 Gy
Plan Total Fractions Prescribed: 30
Plan Total Prescribed Dose: 60 Gy
Reference Point Dosage Given to Date: 22 Gy
Reference Point Session Dosage Given: 2 Gy
Session Number: 11

## 2021-11-10 LAB — CMP (CANCER CENTER ONLY)
ALT: 33 U/L (ref 0–44)
AST: 23 U/L (ref 15–41)
Albumin: 3.9 g/dL (ref 3.5–5.0)
Alkaline Phosphatase: 72 U/L (ref 38–126)
Anion gap: 3 — ABNORMAL LOW (ref 5–15)
BUN: 10 mg/dL (ref 8–23)
CO2: 33 mmol/L — ABNORMAL HIGH (ref 22–32)
Calcium: 9.7 mg/dL (ref 8.9–10.3)
Chloride: 96 mmol/L — ABNORMAL LOW (ref 98–111)
Creatinine: 0.51 mg/dL (ref 0.44–1.00)
GFR, Estimated: >60 mL/min (ref >60)
Glucose, Bld: 82 mg/dL (ref 70–99)
Potassium: 4.8 mmol/L (ref 3.5–5.1)
Sodium: 132 mmol/L — ABNORMAL LOW (ref 135–145)
Total Bilirubin: 0.5 mg/dL (ref 0.3–1.2)
Total Protein: 6.9 g/dL (ref 6.5–8.1)

## 2021-11-10 LAB — CBC WITH DIFFERENTIAL (CANCER CENTER ONLY)
Abs Immature Granulocytes: 0.01 10*3/uL (ref 0.00–0.07)
Basophils Absolute: 0 10*3/uL (ref 0.0–0.1)
Basophils Relative: 0 %
Eosinophils Absolute: 0.1 10*3/uL (ref 0.0–0.5)
Eosinophils Relative: 2 %
HCT: 34.6 % — ABNORMAL LOW (ref 36.0–46.0)
Hemoglobin: 11.7 g/dL — ABNORMAL LOW (ref 12.0–15.0)
Immature Granulocytes: 0 %
Lymphocytes Relative: 18 %
Lymphs Abs: 0.9 10*3/uL (ref 0.7–4.0)
MCH: 32.9 pg (ref 26.0–34.0)
MCHC: 33.8 g/dL (ref 30.0–36.0)
MCV: 97.2 fL (ref 80.0–100.0)
Monocytes Absolute: 0.6 10*3/uL (ref 0.1–1.0)
Monocytes Relative: 11 %
Neutro Abs: 3.3 10*3/uL (ref 1.7–7.7)
Neutrophils Relative %: 69 %
Platelet Count: 238 10*3/uL (ref 150–400)
RBC: 3.56 MIL/uL — ABNORMAL LOW (ref 3.87–5.11)
RDW: 12 % (ref 11.5–15.5)
WBC Count: 4.9 10*3/uL (ref 4.0–10.5)
nRBC: 0 % (ref 0.0–0.2)

## 2021-11-10 MED ORDER — FAMOTIDINE IN NACL 20-0.9 MG/50ML-% IV SOLN
20.0000 mg | Freq: Once | INTRAVENOUS | Status: AC
  Administered 2021-11-10: 20 mg via INTRAVENOUS
  Filled 2021-11-10: qty 50

## 2021-11-10 MED ORDER — SODIUM CHLORIDE 0.9 % IV SOLN: Freq: Once | INTRAVENOUS | Status: AC

## 2021-11-10 MED ORDER — PALONOSETRON HCL INJECTION 0.25 MG/5ML
0.2500 mg | Freq: Once | INTRAVENOUS | Status: AC
  Administered 2021-11-10: 0.25 mg via INTRAVENOUS
  Filled 2021-11-10: qty 5

## 2021-11-10 MED ORDER — SODIUM CHLORIDE 0.9 % IV SOLN
10.0000 mg | Freq: Once | INTRAVENOUS | Status: AC
  Administered 2021-11-10: 10 mg via INTRAVENOUS
  Filled 2021-11-10: qty 10

## 2021-11-10 MED ORDER — SODIUM CHLORIDE 0.9 % IV SOLN
153.6000 mg | Freq: Once | INTRAVENOUS | Status: AC
  Administered 2021-11-10: 150 mg via INTRAVENOUS
  Filled 2021-11-10: qty 15

## 2021-11-10 MED ORDER — SODIUM CHLORIDE 0.9 % IV SOLN
45.0000 mg/m2 | Freq: Once | INTRAVENOUS | Status: AC
  Administered 2021-11-10: 84 mg via INTRAVENOUS
  Filled 2021-11-10: qty 14

## 2021-11-10 MED ORDER — CETIRIZINE HCL 10 MG/ML IV SOLN
10.0000 mg | Freq: Once | INTRAVENOUS | Status: AC
  Administered 2021-11-10: 10 mg via INTRAVENOUS
  Filled 2021-11-10: qty 1

## 2021-11-10 NOTE — Patient Instructions (Signed)
[facility] ONCOLOGY  Discharge Instructions: Thank you for choosing [facility] to provide your oncology and hematology care.   If you have a lab appointment with the [facility], please go directly to the [facility] and check in at the registration area.   Wear comfortable clothing and clothing appropriate for easy access to any Portacath or PICC line.   We strive to give you quality time with your provider. You may need to reschedule your appointment if you arrive late (15 or more minutes).  Arriving late affects you and other patients whose appointments are after yours.  Also, if you miss three or more appointments without notifying the office, you may be dismissed from the clinic at the provider's discretion.      For prescription refill requests, have your pharmacy contact our office and allow 72 hours for refills to be completed.    Today you received the following chemotherapy and/or immunotherapy agents: paclitaxel and carboplatin      To help prevent nausea and vomiting after your treatment, we encourage you to take your nausea medication as directed.  BELOW ARE SYMPTOMS THAT SHOULD BE REPORTED IMMEDIATELY: *FEVER GREATER THAN 100.4 F (38 C) OR HIGHER *CHILLS OR SWEATING *NAUSEA AND VOMITING THAT IS NOT CONTROLLED WITH YOUR NAUSEA MEDICATION *UNUSUAL SHORTNESS OF BREATH *UNUSUAL BRUISING OR BLEEDING *URINARY PROBLEMS (pain or burning when urinating, or frequent urination) *BOWEL PROBLEMS (unusual diarrhea, constipation, pain near the anus) TENDERNESS IN MOUTH AND THROAT WITH OR WITHOUT PRESENCE OF ULCERS (sore throat, sores in mouth, or a toothache) UNUSUAL RASH, SWELLING OR PAIN  UNUSUAL VAGINAL DISCHARGE OR ITCHING   Items with * indicate a potential emergency and should be followed up as soon as possible or go to the Emergency Department if any problems should occur.  Please show the CHEMOTHERAPY ALERT CARD or IMMUNOTHERAPY ALERT  CARD at check-in to the Emergency Department and triage nurse.  Should you have questions after your visit or need to cancel or reschedule your appointment, please contact [facility]  Dept: [phone]  and follow the prompts.  Office hours are 8:00 a.m. to 4:30 p.m. Monday - Friday. Please note that voicemails left after 4:00 p.m. may not be returned until the following business day.  We are closed weekends and major holidays. You have access to a nurse at all times for urgent questions. Please call the main number to the clinic Dept: [phone] and follow the prompts.   For any non-urgent questions, you may also contact your provider using MyChart. We now offer e-Visits for anyone 55 and older to request care online for non-urgent symptoms. For details visit [email or web address].   Also download the MyChart app! Go to the app store, search "MyChart", open the app, select [facility], and log in with your MyChart username and password.  Masks are optional in the cancer centers. If you would like for your care team to wear a mask while they are taking care of you, please let them know. You may have one support person who is at least 78 years old accompany you for your appointments.

## 2021-11-11 ENCOUNTER — Other Ambulatory Visit: Payer: Self-pay

## 2021-11-11 ENCOUNTER — Ambulatory Visit
Admission: RE | Admit: 2021-11-11 | Discharge: 2021-11-11 | Disposition: A | Discharge status: Home / Self Care | Payer: Medicare HMO | Source: Ambulatory Visit | Attending: Radiation Oncology | Admitting: Radiation Oncology

## 2021-11-11 DIAGNOSIS — R21 Rash and other nonspecific skin eruption: Secondary | ICD-10-CM | POA: Diagnosis not present

## 2021-11-11 DIAGNOSIS — J449 Chronic obstructive pulmonary disease, unspecified: Secondary | ICD-10-CM | POA: Diagnosis not present

## 2021-11-11 DIAGNOSIS — I809 Phlebitis and thrombophlebitis of unspecified site: Secondary | ICD-10-CM | POA: Diagnosis not present

## 2021-11-11 DIAGNOSIS — M129 Arthropathy, unspecified: Secondary | ICD-10-CM | POA: Diagnosis not present

## 2021-11-11 DIAGNOSIS — K7689 Other specified diseases of liver: Secondary | ICD-10-CM | POA: Diagnosis not present

## 2021-11-11 DIAGNOSIS — Z510 Encounter for antineoplastic radiation therapy: Secondary | ICD-10-CM | POA: Diagnosis not present

## 2021-11-11 DIAGNOSIS — Z87891 Personal history of nicotine dependence: Secondary | ICD-10-CM | POA: Diagnosis not present

## 2021-11-11 DIAGNOSIS — Z5111 Encounter for antineoplastic chemotherapy: Secondary | ICD-10-CM | POA: Diagnosis not present

## 2021-11-11 DIAGNOSIS — I700 Atherosclerosis of aorta: Secondary | ICD-10-CM | POA: Diagnosis not present

## 2021-11-11 DIAGNOSIS — C3411 Malignant neoplasm of upper lobe, right bronchus or lung: Secondary | ICD-10-CM | POA: Diagnosis not present

## 2021-11-11 LAB — RAD ONC ARIA SESSION SUMMARY
Course Elapsed Days: 15
Plan Fractions Treated to Date: 12
Plan Prescribed Dose Per Fraction: 2 Gy
Plan Total Fractions Prescribed: 30
Plan Total Prescribed Dose: 60 Gy
Reference Point Dosage Given to Date: 24 Gy
Reference Point Session Dosage Given: 2 Gy
Session Number: 12

## 2021-11-12 ENCOUNTER — Other Ambulatory Visit: Payer: Self-pay

## 2021-11-12 ENCOUNTER — Ambulatory Visit
Admission: RE | Admit: 2021-11-12 | Discharge: 2021-11-12 | Disposition: A | Discharge status: Home / Self Care | Payer: Medicare HMO | Source: Ambulatory Visit | Attending: Radiation Oncology | Admitting: Radiation Oncology

## 2021-11-12 DIAGNOSIS — I700 Atherosclerosis of aorta: Secondary | ICD-10-CM | POA: Diagnosis not present

## 2021-11-12 DIAGNOSIS — Z9981 Dependence on supplemental oxygen: Secondary | ICD-10-CM | POA: Insufficient documentation

## 2021-11-12 DIAGNOSIS — E7800 Pure hypercholesterolemia, unspecified: Secondary | ICD-10-CM | POA: Insufficient documentation

## 2021-11-12 DIAGNOSIS — Z5111 Encounter for antineoplastic chemotherapy: Secondary | ICD-10-CM | POA: Insufficient documentation

## 2021-11-12 DIAGNOSIS — Z7982 Long term (current) use of aspirin: Secondary | ICD-10-CM | POA: Insufficient documentation

## 2021-11-12 DIAGNOSIS — C3411 Malignant neoplasm of upper lobe, right bronchus or lung: Secondary | ICD-10-CM | POA: Insufficient documentation

## 2021-11-12 DIAGNOSIS — Z7951 Long term (current) use of inhaled steroids: Secondary | ICD-10-CM | POA: Insufficient documentation

## 2021-11-12 DIAGNOSIS — K7689 Other specified diseases of liver: Secondary | ICD-10-CM | POA: Insufficient documentation

## 2021-11-12 DIAGNOSIS — J449 Chronic obstructive pulmonary disease, unspecified: Secondary | ICD-10-CM | POA: Diagnosis not present

## 2021-11-12 DIAGNOSIS — Z79899 Other long term (current) drug therapy: Secondary | ICD-10-CM | POA: Diagnosis not present

## 2021-11-12 DIAGNOSIS — Z510 Encounter for antineoplastic radiation therapy: Secondary | ICD-10-CM | POA: Diagnosis not present

## 2021-11-12 DIAGNOSIS — I809 Phlebitis and thrombophlebitis of unspecified site: Secondary | ICD-10-CM | POA: Diagnosis not present

## 2021-11-12 DIAGNOSIS — I2510 Atherosclerotic heart disease of native coronary artery without angina pectoris: Secondary | ICD-10-CM | POA: Insufficient documentation

## 2021-11-12 DIAGNOSIS — M129 Arthropathy, unspecified: Secondary | ICD-10-CM | POA: Insufficient documentation

## 2021-11-12 DIAGNOSIS — Z87891 Personal history of nicotine dependence: Secondary | ICD-10-CM | POA: Diagnosis not present

## 2021-11-12 DIAGNOSIS — R21 Rash and other nonspecific skin eruption: Secondary | ICD-10-CM | POA: Insufficient documentation

## 2021-11-12 DIAGNOSIS — Z79624 Long term (current) use of inhibitors of nucleotide synthesis: Secondary | ICD-10-CM | POA: Diagnosis not present

## 2021-11-12 LAB — RAD ONC ARIA SESSION SUMMARY
Course Elapsed Days: 16
Plan Fractions Treated to Date: 13
Plan Prescribed Dose Per Fraction: 2 Gy
Plan Total Fractions Prescribed: 30
Plan Total Prescribed Dose: 60 Gy
Reference Point Dosage Given to Date: 26 Gy
Reference Point Session Dosage Given: 2 Gy
Session Number: 13

## 2021-11-13 ENCOUNTER — Ambulatory Visit
Admission: RE | Admit: 2021-11-13 | Discharge: 2021-11-13 | Disposition: A | Discharge status: Home / Self Care | Payer: Medicare HMO | Source: Ambulatory Visit | Attending: Radiation Oncology | Admitting: Radiation Oncology

## 2021-11-13 ENCOUNTER — Other Ambulatory Visit: Payer: Self-pay

## 2021-11-13 DIAGNOSIS — M129 Arthropathy, unspecified: Secondary | ICD-10-CM | POA: Diagnosis not present

## 2021-11-13 DIAGNOSIS — J449 Chronic obstructive pulmonary disease, unspecified: Secondary | ICD-10-CM | POA: Diagnosis not present

## 2021-11-13 DIAGNOSIS — I700 Atherosclerosis of aorta: Secondary | ICD-10-CM | POA: Diagnosis not present

## 2021-11-13 DIAGNOSIS — K7689 Other specified diseases of liver: Secondary | ICD-10-CM | POA: Diagnosis not present

## 2021-11-13 DIAGNOSIS — Z510 Encounter for antineoplastic radiation therapy: Secondary | ICD-10-CM | POA: Diagnosis not present

## 2021-11-13 DIAGNOSIS — C3411 Malignant neoplasm of upper lobe, right bronchus or lung: Secondary | ICD-10-CM | POA: Diagnosis not present

## 2021-11-13 DIAGNOSIS — R21 Rash and other nonspecific skin eruption: Secondary | ICD-10-CM | POA: Diagnosis not present

## 2021-11-13 DIAGNOSIS — I809 Phlebitis and thrombophlebitis of unspecified site: Secondary | ICD-10-CM | POA: Diagnosis not present

## 2021-11-13 DIAGNOSIS — Z5111 Encounter for antineoplastic chemotherapy: Secondary | ICD-10-CM | POA: Diagnosis not present

## 2021-11-13 DIAGNOSIS — Z87891 Personal history of nicotine dependence: Secondary | ICD-10-CM | POA: Diagnosis not present

## 2021-11-13 LAB — RAD ONC ARIA SESSION SUMMARY
Course Elapsed Days: 17
Plan Fractions Treated to Date: 14
Plan Prescribed Dose Per Fraction: 2 Gy
Plan Total Fractions Prescribed: 30
Plan Total Prescribed Dose: 60 Gy
Reference Point Dosage Given to Date: 28 Gy
Reference Point Session Dosage Given: 2 Gy
Session Number: 14

## 2021-11-14 ENCOUNTER — Ambulatory Visit
Admission: RE | Admit: 2021-11-14 | Discharge: 2021-11-14 | Disposition: A | Discharge status: Home / Self Care | Payer: Medicare HMO | Source: Ambulatory Visit | Attending: Radiation Oncology | Admitting: Radiation Oncology

## 2021-11-14 ENCOUNTER — Other Ambulatory Visit: Payer: Self-pay

## 2021-11-14 DIAGNOSIS — I809 Phlebitis and thrombophlebitis of unspecified site: Secondary | ICD-10-CM | POA: Diagnosis not present

## 2021-11-14 DIAGNOSIS — C3411 Malignant neoplasm of upper lobe, right bronchus or lung: Secondary | ICD-10-CM | POA: Diagnosis not present

## 2021-11-14 DIAGNOSIS — I700 Atherosclerosis of aorta: Secondary | ICD-10-CM | POA: Diagnosis not present

## 2021-11-14 DIAGNOSIS — Z510 Encounter for antineoplastic radiation therapy: Secondary | ICD-10-CM | POA: Diagnosis not present

## 2021-11-14 DIAGNOSIS — J449 Chronic obstructive pulmonary disease, unspecified: Secondary | ICD-10-CM | POA: Diagnosis not present

## 2021-11-14 DIAGNOSIS — Z5111 Encounter for antineoplastic chemotherapy: Secondary | ICD-10-CM | POA: Diagnosis not present

## 2021-11-14 DIAGNOSIS — Z87891 Personal history of nicotine dependence: Secondary | ICD-10-CM | POA: Diagnosis not present

## 2021-11-14 DIAGNOSIS — M129 Arthropathy, unspecified: Secondary | ICD-10-CM | POA: Diagnosis not present

## 2021-11-14 DIAGNOSIS — K7689 Other specified diseases of liver: Secondary | ICD-10-CM | POA: Diagnosis not present

## 2021-11-14 DIAGNOSIS — R21 Rash and other nonspecific skin eruption: Secondary | ICD-10-CM | POA: Diagnosis not present

## 2021-11-14 LAB — RAD ONC ARIA SESSION SUMMARY
Course Elapsed Days: 18
Plan Fractions Treated to Date: 15
Plan Prescribed Dose Per Fraction: 2 Gy
Plan Total Fractions Prescribed: 30
Plan Total Prescribed Dose: 60 Gy
Reference Point Dosage Given to Date: 30 Gy
Reference Point Session Dosage Given: 2 Gy
Session Number: 15

## 2021-11-14 MED FILL — Dexamethasone Sodium Phosphate Inj 100 MG/10ML: INTRAMUSCULAR | Qty: 1 | Status: AC

## 2021-11-14 NOTE — Progress Notes (Unsigned)
Joanne OFFICE PROGRESS NOTE  Joanne Smoker, MD [place] [address]  DIAGNOSIS: Unresectable stage IIb (T1c, No/N1, M0) non-small cell lung cancer, adenocarcinoma.  The patient presented with right upper lobe groundglass lesion and hypermetabolic right hilar lymph node.  She was diagnosed in September 2023.     PRIOR THERAPY: None   CURRENT THERAPY:  Concurrent chemoradiation with weekly carboplatin for AUC of 2 and paclitaxel 45 Mg/M2.  First dose October 27, 2021.  Status post 3 cycles.  INTERVAL HISTORY: Joanne Rogers 76 y.o. female returns to the clinic today for a follow-up visit accompanied by her daughter.  The patient is currently undergoing a course of treatment with concurrent chemoradiation.  She is status post 3 weeks of treatment.  Her last day radiation is tentatively scheduled for 12/12/2021.  She is tolerating it well without any concerning adverse side effects except for some thrombophlebitis.  A Port-A-Cath was discussed with her on many occasions and she is not interested in a Port-A-Cath. She had a few episodes of mild soreness in her esophagus and getting "strangled" briefly. Today she denies any fever, chills, night sweats, or unexplained weight loss. She gained a pound. Denies any changes with her dyspnea on exertion from her COPD but denies any cough, hemoptysis, or chest pain.  Denies any nausea, vomiting, or diarrhea. She had mild constipation and has metamucil but has not needed to take anything. She is up to date on her staging brain MRI. She reports a few headaches which improved with tylenol. denies any visual changes.  She is here today for evaluation and repeat blood work before undergoing cycle #4.   MEDICAL HISTORY: Past Medical History:  Diagnosis Date   Arthritis    back, hands, neck   COPD (chronic obstructive pulmonary disease) ([place])    Coronary artery disease 11/28/2019   CCTA 11/28/19: Mild  non-obstructive CAD, myocardial bridge mid LAD (cardiologist Dr. Standley Dakins, 08/19/21)   DDD (degenerative disc disease), lumbar    Dyspnea    uses oxygen at home   H/O bronchitis    allergy related   Hypercholesterolemia    Oxygen dependent    Palpitations    "fluttering"   Pneumonia    hx of    ALLERGIES:  is allergic to codeine, amoxicillin, latex, levofloxacin, and statins.  MEDICATIONS:  Current Outpatient Medications  Medication Sig Dispense Refill   acetaminophen (TYLENOL) 500 MG tablet Take 1,000 mg by mouth every 6 (six) hours as needed for moderate pain.     albuterol (VENTOLIN HFA) 108 (90 Base) MCG/ACT inhaler Inhale 1-2 puffs into the lungs every 4 (four) hours as needed for wheezing or shortness of breath. 1 Inhaler 5   ascorbic acid (VITAMIN C) 500 MG tablet Take 500 mg by mouth daily.     aspirin 81 MG tablet Take 81 mg by mouth at bedtime.     azelastine (ASTELIN) 0.1 % nasal spray Place 1 spray into both nostrils as needed for rhinitis.     beta carotene w/minerals (OCUVITE) tablet Take 1 tablet by mouth at bedtime.     bisoprolol (ZEBETA) 5 MG tablet Take 1 tablet (5 mg total) by mouth daily. 90 tablet 3   cetirizine (ZYRTEC) 10 MG tablet Take 10 mg by mouth at bedtime.     cholecalciferol (VITAMIN D3) 25 MCG (1000 UNIT) tablet Take 1,000 Units by mouth daily.     Cyanocobalamin (B-12) 2500 MCG TABS Take 2,500 mcg  by mouth daily.     famotidine (PEPCID) 20 MG tablet One at bedtime 30 tablet 11   ibuprofen (ADVIL) 200 MG tablet Take 400 mg by mouth every 6 (six) hours as needed for moderate pain.     ipratropium-albuterol (DUONEB) 0.5-2.5 (3) MG/3ML SOLN Take 3 mLs by nebulization every 4 (four) hours. Dx: J44.9 360 mL 1   Multiple Minerals-Vitamins (CAL MAG ZINC +D3) TABS Take 1-2 tablets by mouth See admin instructions. Take 1 tablet in the morning and 2 tablets in the evening     OXYGEN 2lpm with sleep     Polyethyl Glycol-Propyl Glycol (SYSTANE OP) Place 1 drop  into both eyes daily as needed (dry eyes).     prochlorperazine (COMPAZINE) 10 MG tablet Take 1 tablet (10 mg total) by mouth every 6 (six) hours as needed. 30 tablet 2   Respiratory Therapy Supplies (FLUTTER) DEVI 1 Device by Does not apply route as needed. 1 each 0   SYMBICORT 160-4.5 MCG/ACT inhaler Inhale 2 puffs into the lungs in the morning and at bedtime. 10.2 g 11   Tiotropium Bromide Monohydrate (SPIRIVA RESPIMAT) 2.5 MCG/ACT AERS Inhale 2 puffs into the lungs daily. 4 g 0   valACYclovir (VALTREX) 1000 MG tablet Take 500 mg by mouth every other day.      No current facility-administered medications for this visit.    SURGICAL HISTORY:  Past Surgical History:  Procedure Laterality Date   BRONCHIAL BIOPSY  09/29/2021   Procedure: BRONCHIAL BIOPSIES;  Surgeon: Collene Gobble, MD;  Location: [facility] ENDOSCOPY;  Service: Pulmonary;;   BRONCHIAL BRUSHINGS  09/29/2021   Procedure: BRONCHIAL BRUSHINGS;  Surgeon: Collene Gobble, MD;  Location: [facility] ENDOSCOPY;  Service: Pulmonary;;   BRONCHIAL NEEDLE ASPIRATION BIOPSY  09/29/2021   Procedure: BRONCHIAL NEEDLE ASPIRATION BIOPSIES;  Surgeon: Collene Gobble, MD;  Location: [facility] ENDOSCOPY;  Service: Pulmonary;;   CARDIOVASCULAR STRESS TEST  11/16/2005   EF 70%, NO ISCHEMIA   CARPAL TUNNEL RELEASE Right ~1988   DILATION AND CURETTAGE OF UTERUS     x3: x2 for miscarrages and x1 for polyp   HEMI-MICRODISCECTOMY LUMBAR LAMINECTOMY LEVEL 1 Left 07/20/2014   Procedure: centeral decompression,L4-L5, HEMI-LAMINECTOMY MICRODISCECTOMY L4 - L5 ON THE LEFT  LEVEL 1, forimatomy 4-5 root on the left;  Surgeon: Latanya Maudlin, MD;  Location: [facility] ORS;  Service: Orthopedics;  Laterality: Left;   US ECHOCARDIOGRAPHY  11/19/2005   EF 55-60%   VIDEO BRONCHOSCOPY WITH ENDOBRONCHIAL ULTRASOUND Right 09/29/2021   Procedure: VIDEO BRONCHOSCOPY WITH ENDOBRONCHIAL ULTRASOUND;  Surgeon: Collene Gobble, MD;  Location: [facility] ENDOSCOPY;  Service: Pulmonary;  Laterality: Right;   VIDEO  BRONCHOSCOPY WITH RADIAL ENDOBRONCHIAL ULTRASOUND  09/29/2021   Procedure: VIDEO BRONCHOSCOPY WITH RADIAL ENDOBRONCHIAL ULTRASOUND;  Surgeon: Collene Gobble, MD;  Location: [facility] ENDOSCOPY;  Service: Pulmonary;;    REVIEW OF SYSTEMS:   Review of Systems  Constitutional: Negative for appetite change, chills, fatigue, fever and unexpected weight change.  HENT:  Positive for mild odynphagia. Negative for mouth sores, or nosebleeds.  Eyes: Negative for eye problems and icterus.  Respiratory: Positive for baseline dyspnea on exertion.  Negative for cough, hemoptysis, and wheezing.    Cardiovascular: Negative for chest pain and leg swelling.  Gastrointestinal: Negative for abdominal pain, constipation, diarrhea, nausea and vomiting.  Genitourinary: Negative for bladder incontinence, difficulty urinating, dysuria, frequency and hematuria.   Musculoskeletal: Negative for back pain, gait problem, neck pain and neck stiffness.  Skin: Negative for itching and rash.  Neurological: Negative for dizziness, extremity weakness, gait problem, headaches, light-headedness and seizures.  Hematological: Negative for adenopathy. Does not bruise/bleed easily.  Psychiatric/Behavioral: Negative for confusion, depression and sleep disturbance. The patient is not nervous/anxious.     PHYSICAL EXAMINATION:  Blood pressure (!) 134/58, pulse 71, temperature 97.7 F (36.5 C), temperature source Oral, resp. rate 14, weight 157 lb 12.8 oz (71.6 kg), SpO2 98 %.  ECOG PERFORMANCE STATUS: 1  Physical Exam  Constitutional: Oriented to person, place, and time and well-developed, well-nourished, and in no distress. No distress.  HENT:  Head: Normocephalic and atraumatic.  Mouth/Throat: Oropharynx is clear and moist. No oropharyngeal exudate.  Eyes: Conjunctivae are normal. Right eye exhibits no discharge. Left eye exhibits no discharge. No scleral icterus.  Neck: Normal range of motion. Neck supple.  Cardiovascular: Normal  rate, regular rhythm, normal heart sounds and intact distal pulses.   Pulmonary/Chest: Effort normal and breath sounds normal. No respiratory distress. No wheezes. No rales.  Abdominal: Soft. Bowel sounds are normal. Exhibits no distension and no mass. There is no tenderness.  Musculoskeletal: Normal range of motion. Exhibits no edema.  Lymphadenopathy:    No cervical adenopathy.  Neurological: Alert and oriented to person, place, and time. Exhibits normal muscle tone. Gait normal. Coordination normal.  Skin: Skin is warm and dry. No rash noted. Not diaphoretic. No erythema. No pallor.  Psychiatric: Mood, memory and judgment normal.  Vitals reviewed.  LABORATORY DATA: Lab Results  Component Value Date   WBC 4.0 11/17/2021   HGB 12.2 11/17/2021   HCT 36.2 11/17/2021   MCV 100.0 11/17/2021   PLT 209 11/17/2021      Chemistry      Component Value Date/Time   NA 133 (L) 11/17/2021 0905   NA 137 05/08/2020 0909   K 4.7 11/17/2021 0905   CL 95 (L) 11/17/2021 0905   CO2 34 (H) 11/17/2021 0905   BUN 11 11/17/2021 0905   BUN 8 05/08/2020 0909   CREATININE 0.54 11/17/2021 0905      Component Value Date/Time   CALCIUM 9.4 11/17/2021 0905   ALKPHOS 72 11/17/2021 0905   AST 18 11/17/2021 0905   ALT 26 11/17/2021 0905   BILITOT 0.5 11/17/2021 0905       RADIOGRAPHIC STUDIES:  No results found.   ASSESSMENT/PLAN:  This is a very pleasant 76 year old Caucasian female with unresectable stage II (T1c, N0/N1, M0) non-small cell lung cancer, adenocarcinoma.  The patient presented with right upper lobe groundglass lesion and hypermetabolic right hilar lymph node.  She was diagnosed in September 2023.   She is currently undergoing a course of concurrent chemoradiation with carboplatin for an AUC of 2 and paclitaxel 45 mg per metered square.  The patient status post 3 cycles.   Labs were reviewed.  Recommend that she proceed with cycle #4 today scheduled.  We will see her back for  follow-up visit in 2 weeks for evaluation and repeat blood work before undergoing cycle #6.  Her odynophagia is mild at this time. If worsening, she will discuss with radiation oncology.   She reports a few mild headaches. She will continue to take tylenol. She is up to date on her brain mri.   The patient was advised to call immediately if she has any concerning symptoms in the interval. The patient voices understanding of current disease status and treatment options and is in agreement with the current care plan. All questions were answered. The patient knows to call the clinic with  any problems, questions or concerns. We can certainly see the patient much sooner if necessary          No orders of the defined types were placed in this encounter.    The total time spent in the appointment was 20-29 minutes.   Kylina Vultaggio L Jovan Schickling, PA-C 11/17/21

## 2021-11-17 ENCOUNTER — Inpatient Hospital Stay (HOSPITAL_BASED_OUTPATIENT_CLINIC_OR_DEPARTMENT_OTHER): Payer: Medicare HMO | Admitting: Physician Assistant

## 2021-11-17 ENCOUNTER — Inpatient Hospital Stay: Payer: Medicare HMO

## 2021-11-17 ENCOUNTER — Ambulatory Visit
Admission: RE | Admit: 2021-11-17 | Discharge: 2021-11-17 | Disposition: A | Discharge status: Home / Self Care | Payer: Medicare HMO | Source: Ambulatory Visit | Attending: Radiation Oncology | Admitting: Radiation Oncology

## 2021-11-17 ENCOUNTER — Other Ambulatory Visit: Payer: Self-pay

## 2021-11-17 DIAGNOSIS — Z9221 Personal history of antineoplastic chemotherapy: Secondary | ICD-10-CM | POA: Insufficient documentation

## 2021-11-17 DIAGNOSIS — C3491 Malignant neoplasm of unspecified part of right bronchus or lung: Secondary | ICD-10-CM

## 2021-11-17 DIAGNOSIS — Z7982 Long term (current) use of aspirin: Secondary | ICD-10-CM | POA: Insufficient documentation

## 2021-11-17 DIAGNOSIS — J449 Chronic obstructive pulmonary disease, unspecified: Secondary | ICD-10-CM | POA: Insufficient documentation

## 2021-11-17 DIAGNOSIS — R1310 Dysphagia, unspecified: Secondary | ICD-10-CM | POA: Insufficient documentation

## 2021-11-17 DIAGNOSIS — Z5111 Encounter for antineoplastic chemotherapy: Secondary | ICD-10-CM | POA: Insufficient documentation

## 2021-11-17 DIAGNOSIS — R21 Rash and other nonspecific skin eruption: Secondary | ICD-10-CM | POA: Diagnosis not present

## 2021-11-17 DIAGNOSIS — Z7951 Long term (current) use of inhaled steroids: Secondary | ICD-10-CM | POA: Insufficient documentation

## 2021-11-17 DIAGNOSIS — I809 Phlebitis and thrombophlebitis of unspecified site: Secondary | ICD-10-CM | POA: Diagnosis not present

## 2021-11-17 DIAGNOSIS — E7800 Pure hypercholesterolemia, unspecified: Secondary | ICD-10-CM | POA: Insufficient documentation

## 2021-11-17 DIAGNOSIS — Z79624 Long term (current) use of inhibitors of nucleotide synthesis: Secondary | ICD-10-CM | POA: Insufficient documentation

## 2021-11-17 DIAGNOSIS — Z9981 Dependence on supplemental oxygen: Secondary | ICD-10-CM | POA: Insufficient documentation

## 2021-11-17 DIAGNOSIS — M129 Arthropathy, unspecified: Secondary | ICD-10-CM | POA: Insufficient documentation

## 2021-11-17 DIAGNOSIS — C3411 Malignant neoplasm of upper lobe, right bronchus or lung: Secondary | ICD-10-CM | POA: Insufficient documentation

## 2021-11-17 DIAGNOSIS — Z79899 Other long term (current) drug therapy: Secondary | ICD-10-CM | POA: Insufficient documentation

## 2021-11-17 DIAGNOSIS — Z510 Encounter for antineoplastic radiation therapy: Secondary | ICD-10-CM | POA: Diagnosis not present

## 2021-11-17 DIAGNOSIS — R519 Headache, unspecified: Secondary | ICD-10-CM | POA: Insufficient documentation

## 2021-11-17 DIAGNOSIS — K5900 Constipation, unspecified: Secondary | ICD-10-CM | POA: Insufficient documentation

## 2021-11-17 DIAGNOSIS — Z923 Personal history of irradiation: Secondary | ICD-10-CM | POA: Insufficient documentation

## 2021-11-17 DIAGNOSIS — I700 Atherosclerosis of aorta: Secondary | ICD-10-CM | POA: Diagnosis not present

## 2021-11-17 DIAGNOSIS — Z87891 Personal history of nicotine dependence: Secondary | ICD-10-CM | POA: Diagnosis not present

## 2021-11-17 DIAGNOSIS — I2510 Atherosclerotic heart disease of native coronary artery without angina pectoris: Secondary | ICD-10-CM | POA: Insufficient documentation

## 2021-11-17 DIAGNOSIS — K7689 Other specified diseases of liver: Secondary | ICD-10-CM | POA: Diagnosis not present

## 2021-11-17 LAB — RAD ONC ARIA SESSION SUMMARY
Course Elapsed Days: 21
Plan Fractions Treated to Date: 16
Plan Prescribed Dose Per Fraction: 2 Gy
Plan Total Fractions Prescribed: 30
Plan Total Prescribed Dose: 60 Gy
Reference Point Dosage Given to Date: 32 Gy
Reference Point Session Dosage Given: 2 Gy
Session Number: 16

## 2021-11-17 LAB — CMP (CANCER CENTER ONLY)
ALT: 26 U/L (ref 0–44)
AST: 18 U/L (ref 15–41)
Albumin: 3.8 g/dL (ref 3.5–5.0)
Alkaline Phosphatase: 72 U/L (ref 38–126)
Anion gap: 4 — ABNORMAL LOW (ref 5–15)
BUN: 11 mg/dL (ref 8–23)
CO2: 34 mmol/L — ABNORMAL HIGH (ref 22–32)
Calcium: 9.4 mg/dL (ref 8.9–10.3)
Chloride: 95 mmol/L — ABNORMAL LOW (ref 98–111)
Creatinine: 0.54 mg/dL (ref 0.44–1.00)
GFR, Estimated: >60 mL/min (ref >60)
Glucose, Bld: 137 mg/dL — ABNORMAL HIGH (ref 70–99)
Potassium: 4.7 mmol/L (ref 3.5–5.1)
Sodium: 133 mmol/L — ABNORMAL LOW (ref 135–145)
Total Bilirubin: 0.5 mg/dL (ref 0.3–1.2)
Total Protein: 6.6 g/dL (ref 6.5–8.1)

## 2021-11-17 LAB — CBC WITH DIFFERENTIAL (CANCER CENTER ONLY)
Abs Immature Granulocytes: 0.02 10*3/uL (ref 0.00–0.07)
Basophils Absolute: 0 10*3/uL (ref 0.0–0.1)
Basophils Relative: 1 %
Eosinophils Absolute: 0.1 10*3/uL (ref 0.0–0.5)
Eosinophils Relative: 2 %
HCT: 36.2 % (ref 36.0–46.0)
Hemoglobin: 12.2 g/dL (ref 12.0–15.0)
Immature Granulocytes: 1 %
Lymphocytes Relative: 17 %
Lymphs Abs: 0.7 10*3/uL (ref 0.7–4.0)
MCH: 33.7 pg (ref 26.0–34.0)
MCHC: 33.7 g/dL (ref 30.0–36.0)
MCV: 100 fL (ref 80.0–100.0)
Monocytes Absolute: 0.3 10*3/uL (ref 0.1–1.0)
Monocytes Relative: 8 %
Neutro Abs: 2.9 10*3/uL (ref 1.7–7.7)
Neutrophils Relative %: 71 %
Platelet Count: 209 10*3/uL (ref 150–400)
RBC: 3.62 MIL/uL — ABNORMAL LOW (ref 3.87–5.11)
RDW: 12.1 % (ref 11.5–15.5)
WBC Count: 4 10*3/uL (ref 4.0–10.5)
nRBC: 0 % (ref 0.0–0.2)

## 2021-11-17 MED ORDER — FAMOTIDINE IN NACL 20-0.9 MG/50ML-% IV SOLN
20.0000 mg | Freq: Once | INTRAVENOUS | Status: AC
  Administered 2021-11-17: 20 mg via INTRAVENOUS
  Filled 2021-11-17: qty 50

## 2021-11-17 MED ORDER — SODIUM CHLORIDE 0.9 % IV SOLN
10.0000 mg | Freq: Once | INTRAVENOUS | Status: AC
  Administered 2021-11-17: 10 mg via INTRAVENOUS
  Filled 2021-11-17: qty 10

## 2021-11-17 MED ORDER — SODIUM CHLORIDE 0.9 % IV SOLN: Freq: Once | INTRAVENOUS | Status: AC

## 2021-11-17 MED ORDER — SODIUM CHLORIDE 0.9 % IV SOLN
45.0000 mg/m2 | Freq: Once | INTRAVENOUS | Status: AC
  Administered 2021-11-17: 84 mg via INTRAVENOUS
  Filled 2021-11-17: qty 14

## 2021-11-17 MED ORDER — CETIRIZINE HCL 10 MG/ML IV SOLN
10.0000 mg | Freq: Once | INTRAVENOUS | Status: AC
  Administered 2021-11-17: 10 mg via INTRAVENOUS
  Filled 2021-11-17: qty 1

## 2021-11-17 MED ORDER — SODIUM CHLORIDE 0.9 % IV SOLN
153.6000 mg | Freq: Once | INTRAVENOUS | Status: AC
  Administered 2021-11-17: 150 mg via INTRAVENOUS
  Filled 2021-11-17: qty 15

## 2021-11-17 MED ORDER — PALONOSETRON HCL INJECTION 0.25 MG/5ML
0.2500 mg | Freq: Once | INTRAVENOUS | Status: AC
  Administered 2021-11-17: 0.25 mg via INTRAVENOUS
  Filled 2021-11-17: qty 5

## 2021-11-17 NOTE — Patient Instructions (Signed)
[facility] ONCOLOG[facility]  Discharge Instructions: Thank you for choosing [facility] to provide your oncology and hematology care.   If you have a lab appointment with the [facility], please go directly to the [facility] and check in at the registration area.   Wear comfortable clothing and clothing appropriate for easy access to any Portacath or PICC line.   We strive to give you quality time with your provider. You may need to reschedule your appointment if you arrive late (15 or more minutes).  Arriving late affects you and other patients whose appointments are after yours.  Also, if you miss three or more appointments without notifying the office, you may be dismissed from the clinic at the provider's discretion.      For prescription refill requests, have your pharmacy contact our office and allow 72 hours for refills to be completed.    Today you received the following chemotherapy and/or immunotherapy agents: Paclitaxel and Carboplatin      To help prevent nausea and vomiting after your treatment, we encourage you to take your nausea medication as directed.  BELOW ARE SYMPTOMS THAT SHOULD BE REPORTED IMMEDIATELY: *FEVER GREATER THAN 100.4 F (38 C) OR HIGHER *CHILLS OR SWEATING *NAUSEA AND VOMITING THAT IS NOT CONTROLLED WITH YOUR NAUSEA MEDICATION *UNUSUAL SHORTNESS OF BREATH *UNUSUAL BRUISING OR BLEEDING *URINARY PROBLEMS (pain or burning when urinating, or frequent urination) *BOWEL PROBLEMS (unusual diarrhea, constipation, pain near the anus) TENDERNESS IN MOUTH AND THROAT WITH OR WITHOUT PRESENCE OF ULCERS (sore throat, sores in mouth, or a toothache) UNUSUAL RASH, SWELLING OR PAIN  UNUSUAL VAGINAL DISCHARGE OR ITCHING   Items with * indicate a potential emergency and should be followed up as soon as possible or go to the Emergency Department if any problems should occur.  Please show the CHEMOTHERAPY ALERT CARD or IMMUNOTHERAPY ALERT  CARD at check-in to the Emergency Department and triage nurse.  Should you have questions after your visit or need to cancel or reschedule your appointment, please contact [facility]  Dept: [phone]  and follow the prompts.  Office hours are 8:00 a.m. to 4:30 p.m. Monday - Friday. Please note that voicemails left after 4:00 p.m. may not be returned until the following business day.  We are closed weekends and major holidays. You have access to a nurse at all times for urgent questions. Please call the main number to the clinic Dept: [phone] and follow the prompts.   For any non-urgent questions, you may also contact your provider using MyChart. We now offer e-Visits for anyone 45 and older to request care online for non-urgent symptoms. For details visit [email or web address].   Also download the MyChart app! Go to the app store, search "MyChart", open the app, select [facility], and log in with your MyChart username and password.  Masks are optional in the cancer centers. If you would like for your care team to wear a mask while they are taking care of you, please let them know. You may have one support person who is at least 78 years old accompany you for your appointments.

## 2021-11-18 ENCOUNTER — Other Ambulatory Visit: Payer: Self-pay

## 2021-11-18 ENCOUNTER — Ambulatory Visit
Admission: RE | Admit: 2021-11-18 | Discharge: 2021-11-18 | Disposition: A | Discharge status: Home / Self Care | Payer: Medicare HMO | Source: Ambulatory Visit | Attending: Radiation Oncology | Admitting: Radiation Oncology

## 2021-11-18 DIAGNOSIS — J449 Chronic obstructive pulmonary disease, unspecified: Secondary | ICD-10-CM | POA: Diagnosis not present

## 2021-11-18 DIAGNOSIS — Z510 Encounter for antineoplastic radiation therapy: Secondary | ICD-10-CM | POA: Diagnosis not present

## 2021-11-18 DIAGNOSIS — Z5111 Encounter for antineoplastic chemotherapy: Secondary | ICD-10-CM | POA: Diagnosis not present

## 2021-11-18 DIAGNOSIS — R21 Rash and other nonspecific skin eruption: Secondary | ICD-10-CM | POA: Diagnosis not present

## 2021-11-18 DIAGNOSIS — I809 Phlebitis and thrombophlebitis of unspecified site: Secondary | ICD-10-CM | POA: Diagnosis not present

## 2021-11-18 DIAGNOSIS — C3411 Malignant neoplasm of upper lobe, right bronchus or lung: Secondary | ICD-10-CM | POA: Diagnosis not present

## 2021-11-18 DIAGNOSIS — I700 Atherosclerosis of aorta: Secondary | ICD-10-CM | POA: Diagnosis not present

## 2021-11-18 DIAGNOSIS — K7689 Other specified diseases of liver: Secondary | ICD-10-CM | POA: Diagnosis not present

## 2021-11-18 DIAGNOSIS — Z87891 Personal history of nicotine dependence: Secondary | ICD-10-CM | POA: Diagnosis not present

## 2021-11-18 DIAGNOSIS — M129 Arthropathy, unspecified: Secondary | ICD-10-CM | POA: Diagnosis not present

## 2021-11-18 LAB — RAD ONC ARIA SESSION SUMMARY
Course Elapsed Days: 22
Plan Fractions Treated to Date: 17
Plan Prescribed Dose Per Fraction: 2 Gy
Plan Total Fractions Prescribed: 30
Plan Total Prescribed Dose: 60 Gy
Reference Point Dosage Given to Date: 34 Gy
Reference Point Session Dosage Given: 2 Gy
Session Number: 17

## 2021-11-19 ENCOUNTER — Ambulatory Visit
Admission: RE | Admit: 2021-11-19 | Discharge: 2021-11-19 | Disposition: A | Discharge status: Home / Self Care | Payer: Medicare HMO | Source: Ambulatory Visit | Attending: Radiation Oncology | Admitting: Radiation Oncology

## 2021-11-19 ENCOUNTER — Other Ambulatory Visit: Payer: Self-pay

## 2021-11-19 DIAGNOSIS — Z87891 Personal history of nicotine dependence: Secondary | ICD-10-CM | POA: Diagnosis not present

## 2021-11-19 DIAGNOSIS — R21 Rash and other nonspecific skin eruption: Secondary | ICD-10-CM | POA: Diagnosis not present

## 2021-11-19 DIAGNOSIS — I809 Phlebitis and thrombophlebitis of unspecified site: Secondary | ICD-10-CM | POA: Diagnosis not present

## 2021-11-19 DIAGNOSIS — J449 Chronic obstructive pulmonary disease, unspecified: Secondary | ICD-10-CM | POA: Diagnosis not present

## 2021-11-19 DIAGNOSIS — C3411 Malignant neoplasm of upper lobe, right bronchus or lung: Secondary | ICD-10-CM | POA: Diagnosis not present

## 2021-11-19 DIAGNOSIS — M129 Arthropathy, unspecified: Secondary | ICD-10-CM | POA: Diagnosis not present

## 2021-11-19 DIAGNOSIS — Z510 Encounter for antineoplastic radiation therapy: Secondary | ICD-10-CM | POA: Diagnosis not present

## 2021-11-19 DIAGNOSIS — Z5111 Encounter for antineoplastic chemotherapy: Secondary | ICD-10-CM | POA: Diagnosis not present

## 2021-11-19 DIAGNOSIS — K7689 Other specified diseases of liver: Secondary | ICD-10-CM | POA: Diagnosis not present

## 2021-11-19 DIAGNOSIS — I700 Atherosclerosis of aorta: Secondary | ICD-10-CM | POA: Diagnosis not present

## 2021-11-19 LAB — RAD ONC ARIA SESSION SUMMARY
Course Elapsed Days: 23
Plan Fractions Treated to Date: 18
Plan Prescribed Dose Per Fraction: 2 Gy
Plan Total Fractions Prescribed: 30
Plan Total Prescribed Dose: 60 Gy
Reference Point Dosage Given to Date: 36 Gy
Reference Point Session Dosage Given: 2 Gy
Session Number: 18

## 2021-11-20 ENCOUNTER — Other Ambulatory Visit: Payer: Self-pay

## 2021-11-20 ENCOUNTER — Ambulatory Visit
Admission: RE | Admit: 2021-11-20 | Discharge: 2021-11-20 | Disposition: A | Discharge status: Home / Self Care | Payer: Medicare HMO | Source: Ambulatory Visit | Attending: Radiation Oncology | Admitting: Radiation Oncology

## 2021-11-20 DIAGNOSIS — I700 Atherosclerosis of aorta: Secondary | ICD-10-CM | POA: Diagnosis not present

## 2021-11-20 DIAGNOSIS — C3411 Malignant neoplasm of upper lobe, right bronchus or lung: Secondary | ICD-10-CM | POA: Diagnosis not present

## 2021-11-20 DIAGNOSIS — M129 Arthropathy, unspecified: Secondary | ICD-10-CM | POA: Diagnosis not present

## 2021-11-20 DIAGNOSIS — Z87891 Personal history of nicotine dependence: Secondary | ICD-10-CM | POA: Diagnosis not present

## 2021-11-20 DIAGNOSIS — Z510 Encounter for antineoplastic radiation therapy: Secondary | ICD-10-CM | POA: Diagnosis not present

## 2021-11-20 DIAGNOSIS — R21 Rash and other nonspecific skin eruption: Secondary | ICD-10-CM | POA: Diagnosis not present

## 2021-11-20 DIAGNOSIS — Z5111 Encounter for antineoplastic chemotherapy: Secondary | ICD-10-CM | POA: Diagnosis not present

## 2021-11-20 DIAGNOSIS — J449 Chronic obstructive pulmonary disease, unspecified: Secondary | ICD-10-CM | POA: Diagnosis not present

## 2021-11-20 DIAGNOSIS — I809 Phlebitis and thrombophlebitis of unspecified site: Secondary | ICD-10-CM | POA: Diagnosis not present

## 2021-11-20 DIAGNOSIS — K7689 Other specified diseases of liver: Secondary | ICD-10-CM | POA: Diagnosis not present

## 2021-11-20 LAB — RAD ONC ARIA SESSION SUMMARY
Course Elapsed Days: 24
Plan Fractions Treated to Date: 19
Plan Prescribed Dose Per Fraction: 2 Gy
Plan Total Fractions Prescribed: 30
Plan Total Prescribed Dose: 60 Gy
Reference Point Dosage Given to Date: 38 Gy
Reference Point Session Dosage Given: 2 Gy
Session Number: 19

## 2021-11-21 ENCOUNTER — Ambulatory Visit
Admission: RE | Admit: 2021-11-21 | Discharge: 2021-11-21 | Disposition: A | Discharge status: Home / Self Care | Payer: Medicare HMO | Source: Ambulatory Visit | Attending: Radiation Oncology | Admitting: Radiation Oncology

## 2021-11-21 ENCOUNTER — Other Ambulatory Visit: Payer: Self-pay

## 2021-11-21 DIAGNOSIS — I700 Atherosclerosis of aorta: Secondary | ICD-10-CM | POA: Diagnosis not present

## 2021-11-21 DIAGNOSIS — K7689 Other specified diseases of liver: Secondary | ICD-10-CM | POA: Diagnosis not present

## 2021-11-21 DIAGNOSIS — Z87891 Personal history of nicotine dependence: Secondary | ICD-10-CM | POA: Diagnosis not present

## 2021-11-21 DIAGNOSIS — M129 Arthropathy, unspecified: Secondary | ICD-10-CM | POA: Diagnosis not present

## 2021-11-21 DIAGNOSIS — Z510 Encounter for antineoplastic radiation therapy: Secondary | ICD-10-CM | POA: Diagnosis not present

## 2021-11-21 DIAGNOSIS — R21 Rash and other nonspecific skin eruption: Secondary | ICD-10-CM | POA: Diagnosis not present

## 2021-11-21 DIAGNOSIS — C3411 Malignant neoplasm of upper lobe, right bronchus or lung: Secondary | ICD-10-CM | POA: Diagnosis not present

## 2021-11-21 DIAGNOSIS — J449 Chronic obstructive pulmonary disease, unspecified: Secondary | ICD-10-CM | POA: Diagnosis not present

## 2021-11-21 DIAGNOSIS — Z5111 Encounter for antineoplastic chemotherapy: Secondary | ICD-10-CM | POA: Diagnosis not present

## 2021-11-21 DIAGNOSIS — I809 Phlebitis and thrombophlebitis of unspecified site: Secondary | ICD-10-CM | POA: Diagnosis not present

## 2021-11-21 LAB — RAD ONC ARIA SESSION SUMMARY
Course Elapsed Days: 25
Plan Fractions Treated to Date: 20
Plan Prescribed Dose Per Fraction: 2 Gy
Plan Total Fractions Prescribed: 30
Plan Total Prescribed Dose: 60 Gy
Reference Point Dosage Given to Date: 40 Gy
Reference Point Session Dosage Given: 2 Gy
Session Number: 20

## 2021-11-21 MED FILL — Dexamethasone Sodium Phosphate Inj 100 MG/10ML: INTRAMUSCULAR | Qty: 1 | Status: AC

## 2021-11-24 ENCOUNTER — Other Ambulatory Visit: Payer: Self-pay

## 2021-11-24 ENCOUNTER — Inpatient Hospital Stay: Payer: Medicare HMO

## 2021-11-24 ENCOUNTER — Ambulatory Visit
Admission: RE | Admit: 2021-11-24 | Discharge: 2021-11-24 | Disposition: A | Discharge status: Home / Self Care | Payer: Medicare HMO | Source: Ambulatory Visit | Attending: Radiation Oncology | Admitting: Radiation Oncology

## 2021-11-24 VITALS — BP 130/55 | HR 78 | Temp 97.7°F | Resp 16 | Wt 161.5 lb

## 2021-11-24 DIAGNOSIS — C3411 Malignant neoplasm of upper lobe, right bronchus or lung: Secondary | ICD-10-CM | POA: Diagnosis not present

## 2021-11-24 DIAGNOSIS — C3491 Malignant neoplasm of unspecified part of right bronchus or lung: Secondary | ICD-10-CM

## 2021-11-24 DIAGNOSIS — R21 Rash and other nonspecific skin eruption: Secondary | ICD-10-CM | POA: Diagnosis not present

## 2021-11-24 DIAGNOSIS — Z87891 Personal history of nicotine dependence: Secondary | ICD-10-CM | POA: Diagnosis not present

## 2021-11-24 DIAGNOSIS — Z5111 Encounter for antineoplastic chemotherapy: Secondary | ICD-10-CM | POA: Diagnosis not present

## 2021-11-24 DIAGNOSIS — I809 Phlebitis and thrombophlebitis of unspecified site: Secondary | ICD-10-CM | POA: Diagnosis not present

## 2021-11-24 DIAGNOSIS — Z510 Encounter for antineoplastic radiation therapy: Secondary | ICD-10-CM | POA: Diagnosis not present

## 2021-11-24 DIAGNOSIS — K7689 Other specified diseases of liver: Secondary | ICD-10-CM | POA: Diagnosis not present

## 2021-11-24 DIAGNOSIS — I700 Atherosclerosis of aorta: Secondary | ICD-10-CM | POA: Diagnosis not present

## 2021-11-24 DIAGNOSIS — M129 Arthropathy, unspecified: Secondary | ICD-10-CM | POA: Diagnosis not present

## 2021-11-24 DIAGNOSIS — J449 Chronic obstructive pulmonary disease, unspecified: Secondary | ICD-10-CM | POA: Diagnosis not present

## 2021-11-24 LAB — RAD ONC ARIA SESSION SUMMARY
Course Elapsed Days: 28
Plan Fractions Treated to Date: 21
Plan Prescribed Dose Per Fraction: 2 Gy
Plan Total Fractions Prescribed: 30
Plan Total Prescribed Dose: 60 Gy
Reference Point Dosage Given to Date: 42 Gy
Reference Point Session Dosage Given: 2 Gy
Session Number: 21

## 2021-11-24 LAB — CBC WITH DIFFERENTIAL (CANCER CENTER ONLY)
Abs Immature Granulocytes: 0.03 10*3/uL (ref 0.00–0.07)
Basophils Absolute: 0 10*3/uL (ref 0.0–0.1)
Basophils Relative: 0 %
Eosinophils Absolute: 0 10*3/uL (ref 0.0–0.5)
Eosinophils Relative: 1 %
HCT: 33.1 % — ABNORMAL LOW (ref 36.0–46.0)
Hemoglobin: 11.3 g/dL — ABNORMAL LOW (ref 12.0–15.0)
Immature Granulocytes: 1 %
Lymphocytes Relative: 11 %
Lymphs Abs: 0.6 10*3/uL — ABNORMAL LOW (ref 0.7–4.0)
MCH: 33.7 pg (ref 26.0–34.0)
MCHC: 34.1 g/dL (ref 30.0–36.0)
MCV: 98.8 fL (ref 80.0–100.0)
Monocytes Absolute: 0.5 10*3/uL (ref 0.1–1.0)
Monocytes Relative: 10 %
Neutro Abs: 4.2 10*3/uL (ref 1.7–7.7)
Neutrophils Relative %: 77 %
Platelet Count: 186 10*3/uL (ref 150–400)
RBC: 3.35 MIL/uL — ABNORMAL LOW (ref 3.87–5.11)
RDW: 12.6 % (ref 11.5–15.5)
WBC Count: 5.4 10*3/uL (ref 4.0–10.5)
nRBC: 0 % (ref 0.0–0.2)

## 2021-11-24 LAB — CMP (CANCER CENTER ONLY)
ALT: 24 U/L (ref 0–44)
AST: 18 U/L (ref 15–41)
Albumin: 4.1 g/dL (ref 3.5–5.0)
Alkaline Phosphatase: 69 U/L (ref 38–126)
Anion gap: 4 — ABNORMAL LOW (ref 5–15)
BUN: 13 mg/dL (ref 8–23)
CO2: 34 mmol/L — ABNORMAL HIGH (ref 22–32)
Calcium: 9.5 mg/dL (ref 8.9–10.3)
Chloride: 94 mmol/L — ABNORMAL LOW (ref 98–111)
Creatinine: 0.78 mg/dL (ref 0.44–1.00)
GFR, Estimated: >60 mL/min (ref >60)
Glucose, Bld: 123 mg/dL — ABNORMAL HIGH (ref 70–99)
Potassium: 4.5 mmol/L (ref 3.5–5.1)
Sodium: 132 mmol/L — ABNORMAL LOW (ref 135–145)
Total Bilirubin: 0.5 mg/dL (ref 0.3–1.2)
Total Protein: 7 g/dL (ref 6.5–8.1)

## 2021-11-24 MED ORDER — CETIRIZINE HCL 10 MG/ML IV SOLN
10.0000 mg | Freq: Once | INTRAVENOUS | Status: AC
  Administered 2021-11-24: 10 mg via INTRAVENOUS
  Filled 2021-11-24: qty 1

## 2021-11-24 MED ORDER — PALONOSETRON HCL INJECTION 0.25 MG/5ML
0.2500 mg | Freq: Once | INTRAVENOUS | Status: AC
  Administered 2021-11-24: 0.25 mg via INTRAVENOUS
  Filled 2021-11-24: qty 5

## 2021-11-24 MED ORDER — SODIUM CHLORIDE 0.9 % IV SOLN
45.0000 mg/m2 | Freq: Once | INTRAVENOUS | Status: AC
  Administered 2021-11-24: 84 mg via INTRAVENOUS
  Filled 2021-11-24: qty 14

## 2021-11-24 MED ORDER — SODIUM CHLORIDE 0.9 % IV SOLN
10.0000 mg | Freq: Once | INTRAVENOUS | Status: AC
  Administered 2021-11-24: 10 mg via INTRAVENOUS
  Filled 2021-11-24: qty 10

## 2021-11-24 MED ORDER — FAMOTIDINE IN NACL 20-0.9 MG/50ML-% IV SOLN
20.0000 mg | Freq: Once | INTRAVENOUS | Status: AC
  Administered 2021-11-24: 20 mg via INTRAVENOUS
  Filled 2021-11-24: qty 50

## 2021-11-24 MED ORDER — SODIUM CHLORIDE 0.9 % IV SOLN
153.6000 mg | Freq: Once | INTRAVENOUS | Status: AC
  Administered 2021-11-24: 150 mg via INTRAVENOUS
  Filled 2021-11-24: qty 15

## 2021-11-24 MED ORDER — SODIUM CHLORIDE 0.9 % IV SOLN: Freq: Once | INTRAVENOUS | Status: AC

## 2021-11-24 NOTE — Patient Instructions (Signed)
[facility] ONCOLOGY  Discharge Instructions: Thank you for choosing [facility] to provide your oncology and hematology care.   If you have a lab appointment with the [facility], please go directly to the [facility] and check in at the registration area.   Wear comfortable clothing and clothing appropriate for easy access to any Portacath or PICC line.   We strive to give you quality time with your provider. You may need to reschedule your appointment if you arrive late (15 or more minutes).  Arriving late affects you and other patients whose appointments are after yours.  Also, if you miss three or more appointments without notifying the office, you may be dismissed from the clinic at the provider's discretion.      For prescription refill requests, have your pharmacy contact our office and allow 72 hours for refills to be completed.    Today you received the following chemotherapy and/or immunotherapy agents: paclitaxel and carboplatin      To help prevent nausea and vomiting after your treatment, we encourage you to take your nausea medication as directed.  BELOW ARE SYMPTOMS THAT SHOULD BE REPORTED IMMEDIATELY: *FEVER GREATER THAN 100.4 F (38 C) OR HIGHER *CHILLS OR SWEATING *NAUSEA AND VOMITING THAT IS NOT CONTROLLED WITH YOUR NAUSEA MEDICATION *UNUSUAL SHORTNESS OF BREATH *UNUSUAL BRUISING OR BLEEDING *URINARY PROBLEMS (pain or burning when urinating, or frequent urination) *BOWEL PROBLEMS (unusual diarrhea, constipation, pain near the anus) TENDERNESS IN MOUTH AND THROAT WITH OR WITHOUT PRESENCE OF ULCERS (sore throat, sores in mouth, or a toothache) UNUSUAL RASH, SWELLING OR PAIN  UNUSUAL VAGINAL DISCHARGE OR ITCHING   Items with * indicate a potential emergency and should be followed up as soon as possible or go to the Emergency Department if any problems should occur.  Please show the CHEMOTHERAPY ALERT CARD or IMMUNOTHERAPY ALERT  CARD at check-in to the Emergency Department and triage nurse.  Should you have questions after your visit or need to cancel or reschedule your appointment, please contact [facility]  Dept: [phone]  and follow the prompts.  Office hours are 8:00 a.m. to 4:30 p.m. Monday - Friday. Please note that voicemails left after 4:00 p.m. may not be returned until the following business day.  We are closed weekends and major holidays. You have access to a nurse at all times for urgent questions. Please call the main number to the clinic Dept: [phone] and follow the prompts.   For any non-urgent questions, you may also contact your provider using MyChart. We now offer e-Visits for anyone 84 and older to request care online for non-urgent symptoms. For details visit [email or web address].   Also download the MyChart app! Go to the app store, search "MyChart", open the app, select [facility], and log in with your MyChart username and password.  Masks are optional in the cancer centers. If you would like for your care team to wear a mask while they are taking care of you, please let them know. You may have one support person who is at least 78 years old accompany you for your appointments.

## 2021-11-25 ENCOUNTER — Other Ambulatory Visit: Payer: Self-pay

## 2021-11-25 ENCOUNTER — Ambulatory Visit
Admission: RE | Admit: 2021-11-25 | Discharge: 2021-11-25 | Disposition: A | Discharge status: Home / Self Care | Payer: Medicare HMO | Source: Ambulatory Visit | Attending: Radiation Oncology | Admitting: Radiation Oncology

## 2021-11-25 DIAGNOSIS — Z510 Encounter for antineoplastic radiation therapy: Secondary | ICD-10-CM | POA: Diagnosis not present

## 2021-11-25 DIAGNOSIS — J449 Chronic obstructive pulmonary disease, unspecified: Secondary | ICD-10-CM | POA: Diagnosis not present

## 2021-11-25 DIAGNOSIS — I809 Phlebitis and thrombophlebitis of unspecified site: Secondary | ICD-10-CM | POA: Diagnosis not present

## 2021-11-25 DIAGNOSIS — M129 Arthropathy, unspecified: Secondary | ICD-10-CM | POA: Diagnosis not present

## 2021-11-25 DIAGNOSIS — R21 Rash and other nonspecific skin eruption: Secondary | ICD-10-CM | POA: Diagnosis not present

## 2021-11-25 DIAGNOSIS — K7689 Other specified diseases of liver: Secondary | ICD-10-CM | POA: Diagnosis not present

## 2021-11-25 DIAGNOSIS — C3411 Malignant neoplasm of upper lobe, right bronchus or lung: Secondary | ICD-10-CM | POA: Diagnosis not present

## 2021-11-25 DIAGNOSIS — I700 Atherosclerosis of aorta: Secondary | ICD-10-CM | POA: Diagnosis not present

## 2021-11-25 DIAGNOSIS — Z87891 Personal history of nicotine dependence: Secondary | ICD-10-CM | POA: Diagnosis not present

## 2021-11-25 DIAGNOSIS — Z5111 Encounter for antineoplastic chemotherapy: Secondary | ICD-10-CM | POA: Diagnosis not present

## 2021-11-25 LAB — RAD ONC ARIA SESSION SUMMARY
Course Elapsed Days: 29
Plan Fractions Treated to Date: 22
Plan Prescribed Dose Per Fraction: 2 Gy
Plan Total Fractions Prescribed: 30
Plan Total Prescribed Dose: 60 Gy
Reference Point Dosage Given to Date: 44 Gy
Reference Point Session Dosage Given: 2 Gy
Session Number: 22

## 2021-11-25 NOTE — Progress Notes (Signed)
[facility] OFFICE PROGRESS NOTE  [name], [place] [id number]  DIAGNOSIS: Unresectable stage IIb (T1c, No/N1, M0) non-small cell lung cancer, adenocarcinoma.  The patient presented with right upper lobe groundglass lesion and hypermetabolic right hilar lymph node.  She was diagnosed in September 2023.    PRIOR THERAPY: None  CURRENT THERAPY: Concurrent chemoradiation with weekly carboplatin for AUC of 2 and paclitaxel 45 Mg/M2.  First dose October 27, 2021.  Status post 5 cycles.   INTERVAL HISTORY: Joanne Rogers 76 y.o. female returns to the clinic today for a follow-up visit accompanied by her friend. The patient is currently undergoing a course of treatment with concurrent chemoradiation.  She is status post 5 weeks of treatment.  Her last day radiation is tentatively scheduled for 12/12/2021. Today she denies any fever, chills, night sweats, or unexplained weight loss. She gained a few pounds. Denies any changes with her dyspnea on exertion from her COPD but denies any cough, hemoptysis, or chest pain. She has some mild odynphagia/dysphagia but states it is not enough to warrant taking any medications for it. She mostly feels it if she eats certain foods like chips. She has a self limiting nose bleed last week which she attributed to the air being dry. She takes 81 mg aspirin. Denies any nausea, vomiting, constipation, or diarrhea. She is up to date on her staging brain MRI. She reports a few headaches which improved with tylenol. Denies any visual changes.  She is here today for evaluation repeat blood work before undergoing cycle #6.    MEDICAL HISTORY: Past Medical History:  Diagnosis Date   Arthritis    back, hands, neck   COPD (chronic obstructive pulmonary disease) ([place])    Coronary artery disease 11/28/2019   CCTA 11/28/19: Mild non-obstructive CAD, myocardial bridge mid LAD (cardiologist Dr. Standley Dakins, 08/19/21)   DDD  (degenerative disc disease), lumbar    Dyspnea    uses oxygen at home   H/O bronchitis    allergy related   Hypercholesterolemia    Oxygen dependent    Palpitations    "fluttering"   Pneumonia    hx of    ALLERGIES:  is allergic to codeine, amoxicillin, latex, levofloxacin, and statins.  MEDICATIONS:  Current Outpatient Medications  Medication Sig Dispense Refill   acetaminophen (TYLENOL) 500 MG tablet Take 1,000 mg by mouth every 6 (six) hours as needed for moderate pain.     albuterol (VENTOLIN HFA) 108 (90 Base) MCG/ACT inhaler Inhale 1-2 puffs into the lungs every 4 (four) hours as needed for wheezing or shortness of breath. 1 Inhaler 5   ascorbic acid (VITAMIN C) 500 MG tablet Take 500 mg by mouth daily.     aspirin 81 MG tablet Take 81 mg by mouth at bedtime.     azelastine (ASTELIN) 0.1 % nasal spray Place 1 spray into both nostrils as needed for rhinitis.     beta carotene w/minerals (OCUVITE) tablet Take 1 tablet by mouth at bedtime.     bisoprolol (ZEBETA) 5 MG tablet Take 1 tablet (5 mg total) by mouth daily. 90 tablet 3   cetirizine (ZYRTEC) 10 MG tablet Take 10 mg by mouth at bedtime.     cholecalciferol (VITAMIN D3) 25 MCG (1000 UNIT) tablet Take 1,000 Units by mouth daily.     Cyanocobalamin (B-12) 2500 MCG TABS Take 2,500 mcg by mouth daily.     famotidine (PEPCID) 20 MG tablet One at  bedtime 30 tablet 11   ibuprofen (ADVIL) 200 MG tablet Take 400 mg by mouth every 6 (six) hours as needed for moderate pain.     ipratropium-albuterol (DUONEB) 0.5-2.5 (3) MG/3ML SOLN Take 3 mLs by nebulization every 4 (four) hours. Dx: J44.9 360 mL 1   Multiple Minerals-Vitamins (CAL MAG ZINC +D3) TABS Take 1-2 tablets by mouth See admin instructions. Take 1 tablet in the morning and 2 tablets in the evening     OXYGEN 2lpm with sleep     Polyethyl Glycol-Propyl Glycol (SYSTANE OP) Place 1 drop into both eyes daily as needed (dry eyes).     prochlorperazine (COMPAZINE) 10 MG tablet  Take 1 tablet (10 mg total) by mouth every 6 (six) hours as needed. 30 tablet 2   Respiratory Therapy Supplies (FLUTTER) DEVI 1 Device by Does not apply route as needed. 1 each 0   SYMBICORT 160-4.5 MCG/ACT inhaler Inhale 2 puffs into the lungs in the morning and at bedtime. 10.2 g 11   Tiotropium Bromide Monohydrate (SPIRIVA RESPIMAT) 2.5 MCG/ACT AERS Inhale 2 puffs into the lungs daily. 4 g 0   valACYclovir (VALTREX) 1000 MG tablet Take 500 mg by mouth every other day.      No current facility-administered medications for this visit.    SURGICAL HISTORY:  Past Surgical History:  Procedure Laterality Date   BRONCHIAL BIOPSY  09/29/2021   Procedure: BRONCHIAL BIOPSIES;  Surgeon: Collene Gobble, MD;  Location: [facility] ENDOSCOPY;  Service: Pulmonary;;   BRONCHIAL BRUSHINGS  09/29/2021   Procedure: BRONCHIAL BRUSHINGS;  Surgeon: Collene Gobble, MD;  Location: [facility] ENDOSCOPY;  Service: Pulmonary;;   BRONCHIAL NEEDLE ASPIRATION BIOPSY  09/29/2021   Procedure: BRONCHIAL NEEDLE ASPIRATION BIOPSIES;  Surgeon: Collene Gobble, MD;  Location: [facility] ENDOSCOPY;  Service: Pulmonary;;   CARDIOVASCULAR STRESS TEST  11/16/2005   EF 70%, NO ISCHEMIA   CARPAL TUNNEL RELEASE Right ~1988   DILATION AND CURETTAGE OF UTERUS     x3: x2 for miscarrages and x1 for polyp   HEMI-MICRODISCECTOMY LUMBAR LAMINECTOMY LEVEL 1 Left 07/20/2014   Procedure: centeral decompression,L4-L5, HEMI-LAMINECTOMY MICRODISCECTOMY L4 - L5 ON THE LEFT  LEVEL 1, forimatomy 4-5 root on the left;  Surgeon: Latanya Maudlin, MD;  Location: [facility] ORS;  Service: Orthopedics;  Laterality: Left;   US ECHOCARDIOGRAPHY  11/19/2005   EF 55-60%   VIDEO BRONCHOSCOPY WITH ENDOBRONCHIAL ULTRASOUND Right 09/29/2021   Procedure: VIDEO BRONCHOSCOPY WITH ENDOBRONCHIAL ULTRASOUND;  Surgeon: Collene Gobble, MD;  Location: [facility] ENDOSCOPY;  Service: Pulmonary;  Laterality: Right;   VIDEO BRONCHOSCOPY WITH RADIAL ENDOBRONCHIAL ULTRASOUND  09/29/2021   Procedure: VIDEO  BRONCHOSCOPY WITH RADIAL ENDOBRONCHIAL ULTRASOUND;  Surgeon: Collene Gobble, MD;  Location: [facility] ENDOSCOPY;  Service: Pulmonary;;    REVIEW OF SYSTEMS:   Constitutional: Negative for appetite change, chills, fatigue, fever and unexpected weight change.  HENT: Positive for one episode of self limiting nose bleed last week. Positive for mild odynphagia. Negative for mouth sores, or nosebleeds.  Eyes: Negative for eye problems and icterus.  Respiratory: Positive for baseline dyspnea on exertion.  Negative for cough, hemoptysis, and wheezing.    Cardiovascular: Negative for chest pain and leg swelling.  Gastrointestinal: Negative for abdominal pain, constipation, diarrhea, nausea and vomiting.  Genitourinary: Negative for bladder incontinence, difficulty urinating, dysuria, frequency and hematuria.   Musculoskeletal: Negative for back pain, gait problem, neck pain and neck stiffness.  Skin: Negative for itching and rash.  Neurological: Negative for dizziness, extremity weakness, gait problem,  headaches, light-headedness and seizures.  Hematological: Negative for adenopathy. Does not bruise/bleed easily.  Psychiatric/Behavioral: Negative for confusion, depression and sleep disturbance. The patient is not nervous/anxious.    PHYSICAL EXAMINATION:  Blood pressure (!) 149/59, pulse 86, temperature 97.7 F (36.5 C), temperature source Oral, resp. rate 18, weight 160 lb (72.6 kg), SpO2 95 %.  ECOG PERFORMANCE STATUS: 1  Physical Exam  Constitutional: Oriented to person, place, and time and well-developed, well-nourished, and in no distress.  HENT:  Head: Normocephalic and atraumatic.  Mouth/Throat: Oropharynx is clear and moist. No oropharyngeal exudate.  Eyes: Conjunctivae are normal. Right eye exhibits no discharge. Left eye exhibits no discharge. No scleral icterus.  Neck: Normal range of motion. Neck supple.  Cardiovascular: Normal rate, regular rhythm, normal heart sounds and intact distal  pulses.   Pulmonary/Chest: Effort normal and breath sounds normal. No respiratory distress. No wheezes. No rales.  Abdominal: Soft. Bowel sounds are normal. Exhibits no distension and no mass. There is no tenderness.  Musculoskeletal: Normal range of motion. Exhibits no edema.  Lymphadenopathy:    No cervical adenopathy.  Neurological: Alert and oriented to person, place, and time. Exhibits normal muscle tone. Gait normal. Coordination normal.  Skin: Skin is warm and dry. No rash noted. Not diaphoretic. No erythema. No pallor.  Psychiatric: Mood, memory and judgment normal.  Vitals reviewed.  LABORATORY DATA: Lab Results  Component Value Date   WBC 3.6 (L) 12/01/2021   HGB 12.1 12/01/2021   HCT 35.8 (L) 12/01/2021   MCV 100.3 (H) 12/01/2021   PLT 173 12/01/2021      Chemistry      Component Value Date/Time   NA 132 (L) 11/24/2021 1308   NA 137 05/08/2020 0909   K 4.5 11/24/2021 1308   CL 94 (L) 11/24/2021 1308   CO2 34 (H) 11/24/2021 1308   BUN 13 11/24/2021 1308   BUN 8 05/08/2020 0909   CREATININE 0.78 11/24/2021 1308      Component Value Date/Time   CALCIUM 9.5 11/24/2021 1308   ALKPHOS 69 11/24/2021 1308   AST 18 11/24/2021 1308   ALT 24 11/24/2021 1308   BILITOT 0.5 11/24/2021 1308       RADIOGRAPHIC STUDIES:  No results found.   ASSESSMENT/PLAN:  This is a very pleasant 77 year old Caucasian female with unresectable stage II (T1c, N0/N1, M0) non-small cell lung cancer, adenocarcinoma.  The patient presented with right upper lobe groundglass lesion and hypermetabolic right hilar lymph node.  She was diagnosed in September 2023.    She is currently undergoing a course of concurrent chemoradiation with carboplatin for an AUC of 2 and paclitaxel 45 mg per metered square.  The patient status post 5 cycles.    Labs were reviewed.  Recommend that she proceed with cycle #6 today scheduled.  I discussed with the patient that since her last day radiation is on 12/1,  if her labs permit, I recommend receiving cycle #7 next week on 11/27.  I will arrange for restaging CT scan of her chest approximately 3 weeks after completing radiation.  We will plan to see her back for follow-up visit a few days later to review the scan results and discuss the next steps in her care.  Advised to use saline spray for her dry nasal mucosa.   Her dysphagia is mild and only with certain foods. She does not feel that she needs carafate at this time.   The patient was advised to call immediately if she has any  concerning symptoms in the interval. The patient voices understanding of current disease status and treatment options and is in agreement with the current care plan. All questions were answered. The patient knows to call the clinic with any problems, questions or concerns. We can certainly see the patient much sooner if necessary    Orders Placed This Encounter  Procedures   CT Chest W Contrast    Standing Status:   Future    Standing Expiration Date:   12/01/2022    Order Specific Question:   If indicated for the ordered procedure, I authorize the administration of contrast media per Radiology protocol    Answer:   Yes    Order Specific Question:   Does the patient have a contrast media/X-ray dye allergy?    Answer:   No    Order Specific Question:   Preferred imaging location?    Answer:   [facility]     The total time spent in the appointment was 20-29 minutes.   Everette Mall L Fin Hupp, PA-C 12/01/21

## 2021-11-26 ENCOUNTER — Ambulatory Visit
Admission: RE | Admit: 2021-11-26 | Discharge: 2021-11-26 | Disposition: A | Discharge status: Home / Self Care | Payer: Medicare HMO | Source: Ambulatory Visit | Attending: Radiation Oncology | Admitting: Radiation Oncology

## 2021-11-26 ENCOUNTER — Other Ambulatory Visit: Payer: Self-pay

## 2021-11-26 DIAGNOSIS — C3411 Malignant neoplasm of upper lobe, right bronchus or lung: Secondary | ICD-10-CM | POA: Diagnosis not present

## 2021-11-26 DIAGNOSIS — K7689 Other specified diseases of liver: Secondary | ICD-10-CM | POA: Diagnosis not present

## 2021-11-26 DIAGNOSIS — R21 Rash and other nonspecific skin eruption: Secondary | ICD-10-CM | POA: Diagnosis not present

## 2021-11-26 DIAGNOSIS — M129 Arthropathy, unspecified: Secondary | ICD-10-CM | POA: Diagnosis not present

## 2021-11-26 DIAGNOSIS — Z87891 Personal history of nicotine dependence: Secondary | ICD-10-CM | POA: Diagnosis not present

## 2021-11-26 DIAGNOSIS — I700 Atherosclerosis of aorta: Secondary | ICD-10-CM | POA: Diagnosis not present

## 2021-11-26 DIAGNOSIS — I809 Phlebitis and thrombophlebitis of unspecified site: Secondary | ICD-10-CM | POA: Diagnosis not present

## 2021-11-26 DIAGNOSIS — Z510 Encounter for antineoplastic radiation therapy: Secondary | ICD-10-CM | POA: Diagnosis not present

## 2021-11-26 DIAGNOSIS — Z5111 Encounter for antineoplastic chemotherapy: Secondary | ICD-10-CM | POA: Diagnosis not present

## 2021-11-26 DIAGNOSIS — J449 Chronic obstructive pulmonary disease, unspecified: Secondary | ICD-10-CM | POA: Diagnosis not present

## 2021-11-26 LAB — RAD ONC ARIA SESSION SUMMARY
Course Elapsed Days: 30
Plan Fractions Treated to Date: 23
Plan Prescribed Dose Per Fraction: 2 Gy
Plan Total Fractions Prescribed: 30
Plan Total Prescribed Dose: 60 Gy
Reference Point Dosage Given to Date: 46 Gy
Reference Point Session Dosage Given: 2 Gy
Session Number: 23

## 2021-11-27 ENCOUNTER — Other Ambulatory Visit: Payer: Self-pay

## 2021-11-27 ENCOUNTER — Ambulatory Visit
Admission: RE | Admit: 2021-11-27 | Discharge: 2021-11-27 | Disposition: A | Discharge status: Home / Self Care | Payer: Medicare HMO | Source: Ambulatory Visit | Attending: Radiation Oncology | Admitting: Radiation Oncology

## 2021-11-27 DIAGNOSIS — K7689 Other specified diseases of liver: Secondary | ICD-10-CM | POA: Diagnosis not present

## 2021-11-27 DIAGNOSIS — C3411 Malignant neoplasm of upper lobe, right bronchus or lung: Secondary | ICD-10-CM | POA: Diagnosis not present

## 2021-11-27 DIAGNOSIS — R21 Rash and other nonspecific skin eruption: Secondary | ICD-10-CM | POA: Diagnosis not present

## 2021-11-27 DIAGNOSIS — M129 Arthropathy, unspecified: Secondary | ICD-10-CM | POA: Diagnosis not present

## 2021-11-27 DIAGNOSIS — I809 Phlebitis and thrombophlebitis of unspecified site: Secondary | ICD-10-CM | POA: Diagnosis not present

## 2021-11-27 DIAGNOSIS — I700 Atherosclerosis of aorta: Secondary | ICD-10-CM | POA: Diagnosis not present

## 2021-11-27 DIAGNOSIS — J449 Chronic obstructive pulmonary disease, unspecified: Secondary | ICD-10-CM | POA: Diagnosis not present

## 2021-11-27 DIAGNOSIS — Z510 Encounter for antineoplastic radiation therapy: Secondary | ICD-10-CM | POA: Diagnosis not present

## 2021-11-27 DIAGNOSIS — Z5111 Encounter for antineoplastic chemotherapy: Secondary | ICD-10-CM | POA: Diagnosis not present

## 2021-11-27 DIAGNOSIS — Z87891 Personal history of nicotine dependence: Secondary | ICD-10-CM | POA: Diagnosis not present

## 2021-11-27 LAB — RAD ONC ARIA SESSION SUMMARY
Course Elapsed Days: 31
Plan Fractions Treated to Date: 24
Plan Prescribed Dose Per Fraction: 2 Gy
Plan Total Fractions Prescribed: 30
Plan Total Prescribed Dose: 60 Gy
Reference Point Dosage Given to Date: 48 Gy
Reference Point Session Dosage Given: 2 Gy
Session Number: 24

## 2021-11-28 ENCOUNTER — Other Ambulatory Visit: Payer: Self-pay

## 2021-11-28 ENCOUNTER — Ambulatory Visit
Admission: RE | Admit: 2021-11-28 | Discharge: 2021-11-28 | Disposition: A | Discharge status: Home / Self Care | Payer: Medicare HMO | Source: Ambulatory Visit | Attending: Radiation Oncology | Admitting: Radiation Oncology

## 2021-11-28 DIAGNOSIS — I809 Phlebitis and thrombophlebitis of unspecified site: Secondary | ICD-10-CM | POA: Diagnosis not present

## 2021-11-28 DIAGNOSIS — Z5111 Encounter for antineoplastic chemotherapy: Secondary | ICD-10-CM | POA: Diagnosis not present

## 2021-11-28 DIAGNOSIS — J449 Chronic obstructive pulmonary disease, unspecified: Secondary | ICD-10-CM | POA: Diagnosis not present

## 2021-11-28 DIAGNOSIS — K7689 Other specified diseases of liver: Secondary | ICD-10-CM | POA: Diagnosis not present

## 2021-11-28 DIAGNOSIS — C3411 Malignant neoplasm of upper lobe, right bronchus or lung: Secondary | ICD-10-CM | POA: Diagnosis not present

## 2021-11-28 DIAGNOSIS — M129 Arthropathy, unspecified: Secondary | ICD-10-CM | POA: Diagnosis not present

## 2021-11-28 DIAGNOSIS — I700 Atherosclerosis of aorta: Secondary | ICD-10-CM | POA: Diagnosis not present

## 2021-11-28 DIAGNOSIS — Z510 Encounter for antineoplastic radiation therapy: Secondary | ICD-10-CM | POA: Diagnosis not present

## 2021-11-28 DIAGNOSIS — Z87891 Personal history of nicotine dependence: Secondary | ICD-10-CM | POA: Diagnosis not present

## 2021-11-28 DIAGNOSIS — R21 Rash and other nonspecific skin eruption: Secondary | ICD-10-CM | POA: Diagnosis not present

## 2021-11-28 LAB — RAD ONC ARIA SESSION SUMMARY
Course Elapsed Days: 32
Plan Fractions Treated to Date: 25
Plan Prescribed Dose Per Fraction: 2 Gy
Plan Total Fractions Prescribed: 30
Plan Total Prescribed Dose: 60 Gy
Reference Point Dosage Given to Date: 50 Gy
Reference Point Session Dosage Given: 2 Gy
Session Number: 25

## 2021-11-28 MED FILL — Dexamethasone Sodium Phosphate Inj 100 MG/10ML: INTRAMUSCULAR | Qty: 1 | Status: AC

## 2021-12-01 ENCOUNTER — Inpatient Hospital Stay (HOSPITAL_BASED_OUTPATIENT_CLINIC_OR_DEPARTMENT_OTHER): Payer: Medicare HMO | Admitting: Physician Assistant

## 2021-12-01 ENCOUNTER — Inpatient Hospital Stay: Payer: Medicare HMO

## 2021-12-01 ENCOUNTER — Other Ambulatory Visit: Payer: Self-pay

## 2021-12-01 ENCOUNTER — Ambulatory Visit
Admission: RE | Admit: 2021-12-01 | Discharge: 2021-12-01 | Disposition: A | Discharge status: Home / Self Care | Payer: Medicare HMO | Source: Ambulatory Visit | Attending: Radiation Oncology | Admitting: Radiation Oncology

## 2021-12-01 VITALS — BP 131/54 | HR 73 | Temp 97.7°F | Resp 20

## 2021-12-01 DIAGNOSIS — C3491 Malignant neoplasm of unspecified part of right bronchus or lung: Secondary | ICD-10-CM

## 2021-12-01 DIAGNOSIS — J449 Chronic obstructive pulmonary disease, unspecified: Secondary | ICD-10-CM | POA: Diagnosis not present

## 2021-12-01 DIAGNOSIS — Z510 Encounter for antineoplastic radiation therapy: Secondary | ICD-10-CM | POA: Diagnosis not present

## 2021-12-01 DIAGNOSIS — Z87891 Personal history of nicotine dependence: Secondary | ICD-10-CM | POA: Diagnosis not present

## 2021-12-01 DIAGNOSIS — R21 Rash and other nonspecific skin eruption: Secondary | ICD-10-CM | POA: Diagnosis not present

## 2021-12-01 DIAGNOSIS — Z5111 Encounter for antineoplastic chemotherapy: Secondary | ICD-10-CM | POA: Diagnosis not present

## 2021-12-01 DIAGNOSIS — I700 Atherosclerosis of aorta: Secondary | ICD-10-CM | POA: Diagnosis not present

## 2021-12-01 DIAGNOSIS — I809 Phlebitis and thrombophlebitis of unspecified site: Secondary | ICD-10-CM | POA: Diagnosis not present

## 2021-12-01 DIAGNOSIS — K7689 Other specified diseases of liver: Secondary | ICD-10-CM | POA: Diagnosis not present

## 2021-12-01 DIAGNOSIS — M129 Arthropathy, unspecified: Secondary | ICD-10-CM | POA: Diagnosis not present

## 2021-12-01 DIAGNOSIS — C3411 Malignant neoplasm of upper lobe, right bronchus or lung: Secondary | ICD-10-CM | POA: Diagnosis not present

## 2021-12-01 LAB — CMP (CANCER CENTER ONLY)
ALT: 21 U/L (ref 0–44)
AST: 18 U/L (ref 15–41)
Albumin: 4.1 g/dL (ref 3.5–5.0)
Alkaline Phosphatase: 72 U/L (ref 38–126)
Anion gap: 7 (ref 5–15)
BUN: 12 mg/dL (ref 8–23)
CO2: 30 mmol/L (ref 22–32)
Calcium: 9.6 mg/dL (ref 8.9–10.3)
Chloride: 97 mmol/L — ABNORMAL LOW (ref 98–111)
Creatinine: 0.46 mg/dL (ref 0.44–1.00)
GFR, Estimated: >60 mL/min (ref >60)
Glucose, Bld: 167 mg/dL — ABNORMAL HIGH (ref 70–99)
Potassium: 4.1 mmol/L (ref 3.5–5.1)
Sodium: 134 mmol/L — ABNORMAL LOW (ref 135–145)
Total Bilirubin: 0.5 mg/dL (ref 0.3–1.2)
Total Protein: 7 g/dL (ref 6.5–8.1)

## 2021-12-01 LAB — RAD ONC ARIA SESSION SUMMARY
Course Elapsed Days: 35
Plan Fractions Treated to Date: 26
Plan Prescribed Dose Per Fraction: 2 Gy
Plan Total Fractions Prescribed: 30
Plan Total Prescribed Dose: 60 Gy
Reference Point Dosage Given to Date: 52 Gy
Reference Point Session Dosage Given: 2 Gy
Session Number: 26

## 2021-12-01 LAB — CBC WITH DIFFERENTIAL (CANCER CENTER ONLY)
Abs Immature Granulocytes: 0.02 10*3/uL (ref 0.00–0.07)
Basophils Absolute: 0 10*3/uL (ref 0.0–0.1)
Basophils Relative: 1 %
Eosinophils Absolute: 0 10*3/uL (ref 0.0–0.5)
Eosinophils Relative: 1 %
HCT: 35.8 % — ABNORMAL LOW (ref 36.0–46.0)
Hemoglobin: 12.1 g/dL (ref 12.0–15.0)
Immature Granulocytes: 1 %
Lymphocytes Relative: 15 %
Lymphs Abs: 0.6 10*3/uL — ABNORMAL LOW (ref 0.7–4.0)
MCH: 33.9 pg (ref 26.0–34.0)
MCHC: 33.8 g/dL (ref 30.0–36.0)
MCV: 100.3 fL — ABNORMAL HIGH (ref 80.0–100.0)
Monocytes Absolute: 0.3 10*3/uL (ref 0.1–1.0)
Monocytes Relative: 7 %
Neutro Abs: 2.7 10*3/uL (ref 1.7–7.7)
Neutrophils Relative %: 75 %
Platelet Count: 173 10*3/uL (ref 150–400)
RBC: 3.57 MIL/uL — ABNORMAL LOW (ref 3.87–5.11)
RDW: 12.7 % (ref 11.5–15.5)
WBC Count: 3.6 10*3/uL — ABNORMAL LOW (ref 4.0–10.5)
nRBC: 0 % (ref 0.0–0.2)

## 2021-12-01 MED ORDER — FAMOTIDINE IN NACL 20-0.9 MG/50ML-% IV SOLN
20.0000 mg | Freq: Once | INTRAVENOUS | Status: AC
  Administered 2021-12-01: 20 mg via INTRAVENOUS
  Filled 2021-12-01: qty 50

## 2021-12-01 MED ORDER — SODIUM CHLORIDE 0.9 % IV SOLN: Freq: Once | INTRAVENOUS | Status: AC

## 2021-12-01 MED ORDER — SODIUM CHLORIDE 0.9 % IV SOLN
10.0000 mg | Freq: Once | INTRAVENOUS | Status: AC
  Administered 2021-12-01: 10 mg via INTRAVENOUS
  Filled 2021-12-01: qty 10

## 2021-12-01 MED ORDER — SODIUM CHLORIDE 0.9 % IV SOLN
45.0000 mg/m2 | Freq: Once | INTRAVENOUS | Status: AC
  Administered 2021-12-01: 84 mg via INTRAVENOUS
  Filled 2021-12-01: qty 14

## 2021-12-01 MED ORDER — SODIUM CHLORIDE 0.9 % IV SOLN
153.6000 mg | Freq: Once | INTRAVENOUS | Status: AC
  Administered 2021-12-01: 150 mg via INTRAVENOUS
  Filled 2021-12-01: qty 15

## 2021-12-01 MED ORDER — CETIRIZINE HCL 10 MG/ML IV SOLN
10.0000 mg | Freq: Once | INTRAVENOUS | Status: AC
  Administered 2021-12-01: 10 mg via INTRAVENOUS
  Filled 2021-12-01: qty 1

## 2021-12-01 MED ORDER — PALONOSETRON HCL INJECTION 0.25 MG/5ML
0.2500 mg | Freq: Once | INTRAVENOUS | Status: AC
  Administered 2021-12-01: 0.25 mg via INTRAVENOUS
  Filled 2021-12-01: qty 5

## 2021-12-01 NOTE — Patient Instructions (Signed)
[facility] ONCOLOGY  Discharge Instructions: Thank you for choosing [facility] to provide your oncology and hematology care.   If you have a lab appointment with the [facility], please go directly to the [facility] and check in at the registration area.   Wear comfortable clothing and clothing appropriate for easy access to any Portacath or PICC line.   We strive to give you quality time with your provider. You may need to reschedule your appointment if you arrive late (15 or more minutes).  Arriving late affects you and other patients whose appointments are after yours.  Also, if you miss three or more appointments without notifying the office, you may be dismissed from the clinic at the provider's discretion.      For prescription refill requests, have your pharmacy contact our office and allow 72 hours for refills to be completed.    Today you received the following chemotherapy and/or immunotherapy agents: Paclitaxel and Carboplatin      To help prevent nausea and vomiting after your treatment, we encourage you to take your nausea medication as directed.  BELOW ARE SYMPTOMS THAT SHOULD BE REPORTED IMMEDIATELY: *FEVER GREATER THAN 100.4 F (38 C) OR HIGHER *CHILLS OR SWEATING *NAUSEA AND VOMITING THAT IS NOT CONTROLLED WITH YOUR NAUSEA MEDICATION *UNUSUAL SHORTNESS OF BREATH *UNUSUAL BRUISING OR BLEEDING *URINARY PROBLEMS (pain or burning when urinating, or frequent urination) *BOWEL PROBLEMS (unusual diarrhea, constipation, pain near the anus) TENDERNESS IN MOUTH AND THROAT WITH OR WITHOUT PRESENCE OF ULCERS (sore throat, sores in mouth, or a toothache) UNUSUAL RASH, SWELLING OR PAIN  UNUSUAL VAGINAL DISCHARGE OR ITCHING   Items with * indicate a potential emergency and should be followed up as soon as possible or go to the Emergency Department if any problems should occur.  Please show the CHEMOTHERAPY ALERT CARD or IMMUNOTHERAPY ALERT  CARD at check-in to the Emergency Department and triage nurse.  Should you have questions after your visit or need to cancel or reschedule your appointment, please contact [facility]  Dept: [phone]  and follow the prompts.  Office hours are 8:00 a.m. to 4:30 p.m. Monday - Friday. Please note that voicemails left after 4:00 p.m. may not be returned until the following business day.  We are closed weekends and major holidays. You have access to a nurse at all times for urgent questions. Please call the main number to the clinic Dept: [phone] and follow the prompts.   For any non-urgent questions, you may also contact your provider using MyChart. We now offer e-Visits for anyone 65 and older to request care online for non-urgent symptoms. For details visit [email or web address].   Also download the MyChart app! Go to the app store, search "MyChart", open the app, select [facility], and log in with your MyChart username and password.  Masks are optional in the cancer centers. If you would like for your care team to wear a mask while they are taking care of you, please let them know. You may have one support person who is at least 78 years old accompany you for your appointments.

## 2021-12-02 ENCOUNTER — Other Ambulatory Visit: Payer: Self-pay

## 2021-12-02 ENCOUNTER — Ambulatory Visit
Admission: RE | Admit: 2021-12-02 | Discharge: 2021-12-02 | Disposition: A | Discharge status: Home / Self Care | Payer: Medicare HMO | Source: Ambulatory Visit | Attending: Radiation Oncology | Admitting: Radiation Oncology

## 2021-12-02 DIAGNOSIS — C3411 Malignant neoplasm of upper lobe, right bronchus or lung: Secondary | ICD-10-CM | POA: Diagnosis not present

## 2021-12-02 DIAGNOSIS — Z5111 Encounter for antineoplastic chemotherapy: Secondary | ICD-10-CM | POA: Diagnosis not present

## 2021-12-02 DIAGNOSIS — R21 Rash and other nonspecific skin eruption: Secondary | ICD-10-CM | POA: Diagnosis not present

## 2021-12-02 DIAGNOSIS — Z510 Encounter for antineoplastic radiation therapy: Secondary | ICD-10-CM | POA: Diagnosis not present

## 2021-12-02 DIAGNOSIS — J449 Chronic obstructive pulmonary disease, unspecified: Secondary | ICD-10-CM | POA: Diagnosis not present

## 2021-12-02 DIAGNOSIS — Z87891 Personal history of nicotine dependence: Secondary | ICD-10-CM | POA: Diagnosis not present

## 2021-12-02 DIAGNOSIS — I809 Phlebitis and thrombophlebitis of unspecified site: Secondary | ICD-10-CM | POA: Diagnosis not present

## 2021-12-02 DIAGNOSIS — M129 Arthropathy, unspecified: Secondary | ICD-10-CM | POA: Diagnosis not present

## 2021-12-02 DIAGNOSIS — K7689 Other specified diseases of liver: Secondary | ICD-10-CM | POA: Diagnosis not present

## 2021-12-02 DIAGNOSIS — I700 Atherosclerosis of aorta: Secondary | ICD-10-CM | POA: Diagnosis not present

## 2021-12-02 LAB — RAD ONC ARIA SESSION SUMMARY
Course Elapsed Days: 36
Plan Fractions Treated to Date: 27
Plan Prescribed Dose Per Fraction: 2 Gy
Plan Total Fractions Prescribed: 30
Plan Total Prescribed Dose: 60 Gy
Reference Point Dosage Given to Date: 54 Gy
Reference Point Session Dosage Given: 2 Gy
Session Number: 27

## 2021-12-03 ENCOUNTER — Ambulatory Visit
Admission: RE | Admit: 2021-12-03 | Discharge: 2021-12-03 | Disposition: A | Discharge status: Home / Self Care | Payer: Medicare HMO | Source: Ambulatory Visit | Attending: Radiation Oncology | Admitting: Radiation Oncology

## 2021-12-03 ENCOUNTER — Other Ambulatory Visit: Payer: Self-pay

## 2021-12-03 DIAGNOSIS — I700 Atherosclerosis of aorta: Secondary | ICD-10-CM | POA: Diagnosis not present

## 2021-12-03 DIAGNOSIS — K7689 Other specified diseases of liver: Secondary | ICD-10-CM | POA: Diagnosis not present

## 2021-12-03 DIAGNOSIS — I809 Phlebitis and thrombophlebitis of unspecified site: Secondary | ICD-10-CM | POA: Diagnosis not present

## 2021-12-03 DIAGNOSIS — Z510 Encounter for antineoplastic radiation therapy: Secondary | ICD-10-CM | POA: Diagnosis not present

## 2021-12-03 DIAGNOSIS — C3411 Malignant neoplasm of upper lobe, right bronchus or lung: Secondary | ICD-10-CM | POA: Diagnosis not present

## 2021-12-03 DIAGNOSIS — M129 Arthropathy, unspecified: Secondary | ICD-10-CM | POA: Diagnosis not present

## 2021-12-03 DIAGNOSIS — R21 Rash and other nonspecific skin eruption: Secondary | ICD-10-CM | POA: Diagnosis not present

## 2021-12-03 DIAGNOSIS — Z5111 Encounter for antineoplastic chemotherapy: Secondary | ICD-10-CM | POA: Diagnosis not present

## 2021-12-03 DIAGNOSIS — Z87891 Personal history of nicotine dependence: Secondary | ICD-10-CM | POA: Diagnosis not present

## 2021-12-03 DIAGNOSIS — J449 Chronic obstructive pulmonary disease, unspecified: Secondary | ICD-10-CM | POA: Diagnosis not present

## 2021-12-03 LAB — RAD ONC ARIA SESSION SUMMARY
Course Elapsed Days: 37
Plan Fractions Treated to Date: 28
Plan Prescribed Dose Per Fraction: 2 Gy
Plan Total Fractions Prescribed: 30
Plan Total Prescribed Dose: 60 Gy
Reference Point Dosage Given to Date: 56 Gy
Reference Point Session Dosage Given: 2 Gy
Session Number: 28

## 2021-12-06 ENCOUNTER — Other Ambulatory Visit: Payer: Self-pay | Admitting: Internal Medicine

## 2021-12-06 NOTE — Telephone Encounter (Signed)
[facility] Telephone Encounter  Patient contacted weekend pager regarding Spiriva refill.  Spiriva refilled x 6 months

## 2021-12-08 ENCOUNTER — Ambulatory Visit
Admission: RE | Admit: 2021-12-08 | Discharge: 2021-12-08 | Disposition: A | Discharge status: Home / Self Care | Payer: Medicare HMO | Source: Ambulatory Visit | Attending: Radiation Oncology | Admitting: Radiation Oncology

## 2021-12-08 ENCOUNTER — Inpatient Hospital Stay: Payer: Medicare HMO

## 2021-12-08 ENCOUNTER — Other Ambulatory Visit: Payer: Self-pay

## 2021-12-08 VITALS — BP 134/57 | HR 80 | Temp 97.9°F | Resp 18 | Wt 159.1 lb

## 2021-12-08 DIAGNOSIS — I809 Phlebitis and thrombophlebitis of unspecified site: Secondary | ICD-10-CM | POA: Diagnosis not present

## 2021-12-08 DIAGNOSIS — C3491 Malignant neoplasm of unspecified part of right bronchus or lung: Secondary | ICD-10-CM

## 2021-12-08 DIAGNOSIS — C3411 Malignant neoplasm of upper lobe, right bronchus or lung: Secondary | ICD-10-CM | POA: Diagnosis not present

## 2021-12-08 DIAGNOSIS — Z5111 Encounter for antineoplastic chemotherapy: Secondary | ICD-10-CM | POA: Diagnosis not present

## 2021-12-08 DIAGNOSIS — M129 Arthropathy, unspecified: Secondary | ICD-10-CM | POA: Diagnosis not present

## 2021-12-08 DIAGNOSIS — Z510 Encounter for antineoplastic radiation therapy: Secondary | ICD-10-CM | POA: Diagnosis not present

## 2021-12-08 DIAGNOSIS — R21 Rash and other nonspecific skin eruption: Secondary | ICD-10-CM | POA: Diagnosis not present

## 2021-12-08 DIAGNOSIS — K7689 Other specified diseases of liver: Secondary | ICD-10-CM | POA: Diagnosis not present

## 2021-12-08 DIAGNOSIS — J449 Chronic obstructive pulmonary disease, unspecified: Secondary | ICD-10-CM | POA: Diagnosis not present

## 2021-12-08 DIAGNOSIS — I700 Atherosclerosis of aorta: Secondary | ICD-10-CM | POA: Diagnosis not present

## 2021-12-08 DIAGNOSIS — Z87891 Personal history of nicotine dependence: Secondary | ICD-10-CM | POA: Diagnosis not present

## 2021-12-08 LAB — RAD ONC ARIA SESSION SUMMARY
Course Elapsed Days: 42
Plan Fractions Treated to Date: 29
Plan Prescribed Dose Per Fraction: 2 Gy
Plan Total Fractions Prescribed: 30
Plan Total Prescribed Dose: 60 Gy
Reference Point Dosage Given to Date: 58 Gy
Reference Point Session Dosage Given: 2 Gy
Session Number: 29

## 2021-12-08 LAB — CBC WITH DIFFERENTIAL (CANCER CENTER ONLY)
Abs Immature Granulocytes: 0.02 10*3/uL (ref 0.00–0.07)
Basophils Absolute: 0 10*3/uL (ref 0.0–0.1)
Basophils Relative: 1 %
Eosinophils Absolute: 0.1 10*3/uL (ref 0.0–0.5)
Eosinophils Relative: 1 %
HCT: 32.1 % — ABNORMAL LOW (ref 36.0–46.0)
Hemoglobin: 10.7 g/dL — ABNORMAL LOW (ref 12.0–15.0)
Immature Granulocytes: 1 %
Lymphocytes Relative: 13 %
Lymphs Abs: 0.6 10*3/uL — ABNORMAL LOW (ref 0.7–4.0)
MCH: 34.1 pg — ABNORMAL HIGH (ref 26.0–34.0)
MCHC: 33.3 g/dL (ref 30.0–36.0)
MCV: 102.2 fL — ABNORMAL HIGH (ref 80.0–100.0)
Monocytes Absolute: 0.4 10*3/uL (ref 0.1–1.0)
Monocytes Relative: 10 %
Neutro Abs: 3.1 10*3/uL (ref 1.7–7.7)
Neutrophils Relative %: 74 %
Platelet Count: 186 10*3/uL (ref 150–400)
RBC: 3.14 MIL/uL — ABNORMAL LOW (ref 3.87–5.11)
RDW: 13.4 % (ref 11.5–15.5)
WBC Count: 4.2 10*3/uL (ref 4.0–10.5)
nRBC: 0 % (ref 0.0–0.2)

## 2021-12-08 LAB — CMP (CANCER CENTER ONLY)
ALT: 32 U/L (ref 0–44)
AST: 23 U/L (ref 15–41)
Albumin: 4.1 g/dL (ref 3.5–5.0)
Alkaline Phosphatase: 76 U/L (ref 38–126)
Anion gap: 5 (ref 5–15)
BUN: 13 mg/dL (ref 8–23)
CO2: 33 mmol/L — ABNORMAL HIGH (ref 22–32)
Calcium: 10 mg/dL (ref 8.9–10.3)
Chloride: 97 mmol/L — ABNORMAL LOW (ref 98–111)
Creatinine: 0.63 mg/dL (ref 0.44–1.00)
GFR, Estimated: >60 mL/min (ref >60)
Glucose, Bld: 125 mg/dL — ABNORMAL HIGH (ref 70–99)
Potassium: 4.3 mmol/L (ref 3.5–5.1)
Sodium: 135 mmol/L (ref 135–145)
Total Bilirubin: 0.4 mg/dL (ref 0.3–1.2)
Total Protein: 6.8 g/dL (ref 6.5–8.1)

## 2021-12-08 MED ORDER — SODIUM CHLORIDE 0.9 % IV SOLN
10.0000 mg | Freq: Once | INTRAVENOUS | Status: DC
  Filled 2021-12-08: qty 1

## 2021-12-08 MED ORDER — SODIUM CHLORIDE 0.9 % IV SOLN
153.6000 mg | Freq: Once | INTRAVENOUS | Status: AC
  Administered 2021-12-08: 150 mg via INTRAVENOUS
  Filled 2021-12-08: qty 15

## 2021-12-08 MED ORDER — CETIRIZINE HCL 10 MG/ML IV SOLN
10.0000 mg | Freq: Once | INTRAVENOUS | Status: AC
  Administered 2021-12-08: 10 mg via INTRAVENOUS
  Filled 2021-12-08: qty 1

## 2021-12-08 MED ORDER — SODIUM CHLORIDE 0.9 % IV SOLN
45.0000 mg/m2 | Freq: Once | INTRAVENOUS | Status: AC
  Administered 2021-12-08: 84 mg via INTRAVENOUS
  Filled 2021-12-08: qty 14

## 2021-12-08 MED ORDER — PALONOSETRON HCL INJECTION 0.25 MG/5ML
0.2500 mg | Freq: Once | INTRAVENOUS | Status: AC
  Administered 2021-12-08: 0.25 mg via INTRAVENOUS
  Filled 2021-12-08: qty 5

## 2021-12-08 MED ORDER — SODIUM CHLORIDE 0.9 % IV SOLN
10.0000 mg | Freq: Once | INTRAVENOUS | Status: AC
  Administered 2021-12-08: 10 mg via INTRAVENOUS
  Filled 2021-12-08: qty 10

## 2021-12-08 MED ORDER — FAMOTIDINE IN NACL 20-0.9 MG/50ML-% IV SOLN
20.0000 mg | Freq: Once | INTRAVENOUS | Status: AC
  Administered 2021-12-08: 20 mg via INTRAVENOUS
  Filled 2021-12-08: qty 50

## 2021-12-08 MED ORDER — SODIUM CHLORIDE 0.9 % IV SOLN: Freq: Once | INTRAVENOUS | Status: AC

## 2021-12-08 NOTE — Patient Instructions (Signed)
[facility] ONCOLOGY  Discharge Instructions: Thank you for choosing [facility] to provide your oncology and hematology care.   If you have a lab appointment with the [facility], please go directly to the [facility] and check in at the registration area.   Wear comfortable clothing and clothing appropriate for easy access to any Portacath or PICC line.   We strive to give you quality time with your provider. You may need to reschedule your appointment if you arrive late (15 or more minutes).  Arriving late affects you and other patients whose appointments are after yours.  Also, if you miss three or more appointments without notifying the office, you may be dismissed from the clinic at the provider's discretion.      For prescription refill requests, have your pharmacy contact our office and allow 72 hours for refills to be completed.    Today you received the following chemotherapy and/or immunotherapy agents: Paclitaxel, Carboplatin.       To help prevent nausea and vomiting after your treatment, we encourage you to take your nausea medication as directed.  BELOW ARE SYMPTOMS THAT SHOULD BE REPORTED IMMEDIATELY: *FEVER GREATER THAN 100.4 F (38 C) OR HIGHER *CHILLS OR SWEATING *NAUSEA AND VOMITING THAT IS NOT CONTROLLED WITH YOUR NAUSEA MEDICATION *UNUSUAL SHORTNESS OF BREATH *UNUSUAL BRUISING OR BLEEDING *URINARY PROBLEMS (pain or burning when urinating, or frequent urination) *BOWEL PROBLEMS (unusual diarrhea, constipation, pain near the anus) TENDERNESS IN MOUTH AND THROAT WITH OR WITHOUT PRESENCE OF ULCERS (sore throat, sores in mouth, or a toothache) UNUSUAL RASH, SWELLING OR PAIN  UNUSUAL VAGINAL DISCHARGE OR ITCHING   Items with * indicate a potential emergency and should be followed up as soon as possible or go to the Emergency Department if any problems should occur.  Please show the CHEMOTHERAPY ALERT CARD or IMMUNOTHERAPY ALERT CARD  at check-in to the Emergency Department and triage nurse.  Should you have questions after your visit or need to cancel or reschedule your appointment, please contact [facility]  Dept: [phone]  and follow the prompts.  Office hours are 8:00 a.m. to 4:30 p.m. Monday - Friday. Please note that voicemails left after 4:00 p.m. may not be returned until the following business day.  We are closed weekends and major holidays. You have access to a nurse at all times for urgent questions. Please call the main number to the clinic Dept: [phone] and follow the prompts.   For any non-urgent questions, you may also contact your provider using MyChart. We now offer e-Visits for anyone 74 and older to request care online for non-urgent symptoms. For details visit [email or web address].   Also download the MyChart app! Go to the app store, search "MyChart", open the app, select [facility], and log in with your MyChart username and password.  Masks are optional in the cancer centers. If you would like for your care team to wear a mask while they are taking care of you, please let them know. You may have one support person who is at least 78 years old accompany you for your appointments.

## 2021-12-09 ENCOUNTER — Ambulatory Visit
Admission: RE | Admit: 2021-12-09 | Discharge: 2021-12-09 | Disposition: A | Discharge status: Home / Self Care | Payer: Medicare HMO | Source: Ambulatory Visit | Attending: Radiation Oncology | Admitting: Radiation Oncology

## 2021-12-09 ENCOUNTER — Other Ambulatory Visit: Payer: Self-pay

## 2021-12-09 DIAGNOSIS — K7689 Other specified diseases of liver: Secondary | ICD-10-CM | POA: Diagnosis not present

## 2021-12-09 DIAGNOSIS — J449 Chronic obstructive pulmonary disease, unspecified: Secondary | ICD-10-CM | POA: Diagnosis not present

## 2021-12-09 DIAGNOSIS — Z510 Encounter for antineoplastic radiation therapy: Secondary | ICD-10-CM | POA: Diagnosis not present

## 2021-12-09 DIAGNOSIS — R21 Rash and other nonspecific skin eruption: Secondary | ICD-10-CM | POA: Diagnosis not present

## 2021-12-09 DIAGNOSIS — M129 Arthropathy, unspecified: Secondary | ICD-10-CM | POA: Diagnosis not present

## 2021-12-09 DIAGNOSIS — I700 Atherosclerosis of aorta: Secondary | ICD-10-CM | POA: Diagnosis not present

## 2021-12-09 DIAGNOSIS — Z87891 Personal history of nicotine dependence: Secondary | ICD-10-CM | POA: Diagnosis not present

## 2021-12-09 DIAGNOSIS — Z5111 Encounter for antineoplastic chemotherapy: Secondary | ICD-10-CM | POA: Diagnosis not present

## 2021-12-09 DIAGNOSIS — I809 Phlebitis and thrombophlebitis of unspecified site: Secondary | ICD-10-CM | POA: Diagnosis not present

## 2021-12-09 DIAGNOSIS — C3411 Malignant neoplasm of upper lobe, right bronchus or lung: Secondary | ICD-10-CM | POA: Diagnosis not present

## 2021-12-09 LAB — RAD ONC ARIA SESSION SUMMARY
Course Elapsed Days: 43
Plan Fractions Treated to Date: 30
Plan Prescribed Dose Per Fraction: 2 Gy
Plan Total Fractions Prescribed: 30
Plan Total Prescribed Dose: 60 Gy
Reference Point Dosage Given to Date: 60 Gy
Reference Point Session Dosage Given: 2 Gy
Session Number: 30

## 2021-12-10 ENCOUNTER — Other Ambulatory Visit: Payer: Self-pay

## 2021-12-10 ENCOUNTER — Ambulatory Visit
Admission: RE | Admit: 2021-12-10 | Discharge: 2021-12-10 | Disposition: A | Discharge status: Home / Self Care | Payer: Medicare HMO | Source: Ambulatory Visit | Attending: Radiation Oncology | Admitting: Radiation Oncology

## 2021-12-10 DIAGNOSIS — K7689 Other specified diseases of liver: Secondary | ICD-10-CM | POA: Diagnosis not present

## 2021-12-10 DIAGNOSIS — J449 Chronic obstructive pulmonary disease, unspecified: Secondary | ICD-10-CM | POA: Diagnosis not present

## 2021-12-10 DIAGNOSIS — R21 Rash and other nonspecific skin eruption: Secondary | ICD-10-CM | POA: Diagnosis not present

## 2021-12-10 DIAGNOSIS — M129 Arthropathy, unspecified: Secondary | ICD-10-CM | POA: Diagnosis not present

## 2021-12-10 DIAGNOSIS — I700 Atherosclerosis of aorta: Secondary | ICD-10-CM | POA: Diagnosis not present

## 2021-12-10 DIAGNOSIS — Z87891 Personal history of nicotine dependence: Secondary | ICD-10-CM | POA: Diagnosis not present

## 2021-12-10 DIAGNOSIS — I809 Phlebitis and thrombophlebitis of unspecified site: Secondary | ICD-10-CM | POA: Diagnosis not present

## 2021-12-10 DIAGNOSIS — C3411 Malignant neoplasm of upper lobe, right bronchus or lung: Secondary | ICD-10-CM | POA: Diagnosis not present

## 2021-12-10 DIAGNOSIS — Z510 Encounter for antineoplastic radiation therapy: Secondary | ICD-10-CM | POA: Diagnosis not present

## 2021-12-10 DIAGNOSIS — Z5111 Encounter for antineoplastic chemotherapy: Secondary | ICD-10-CM | POA: Diagnosis not present

## 2021-12-10 LAB — RAD ONC ARIA SESSION SUMMARY
Course Elapsed Days: 44
Plan Fractions Treated to Date: 1
Plan Prescribed Dose Per Fraction: 2 Gy
Plan Total Fractions Prescribed: 3
Plan Total Prescribed Dose: 6 Gy
Reference Point Dosage Given to Date: 2 Gy
Reference Point Session Dosage Given: 2 Gy
Session Number: 31

## 2021-12-10 NOTE — Progress Notes (Signed)
                                                                                                                                                             Patient Name: Joanne Rogers MRN: [id number] DOB: 07-Jul-1945 Referring Physician: Sela Hilding Date of Service: 12/12/2021 [facility], [place]                                                        End Of Treatment Note  Diagnoses: C34.11-Malignant neoplasm of upper lobe, right bronchus or lung  Cancer Staging:   At least Stage IIB, cT1cN0-1M0, NSCLC, adenocarcinoma of the RUL   Intent: Curative  Radiation Treatment Dates: 10/27/2021 through 12/12/2021 Site Technique Total Dose (Gy) Dose per Fx (Gy) Completed Fx Beam Energies  Lung, Right: Lung_R 3D 60/60 2 30/30 6X, 15X  Lung, Right: Lung_R_Bst 3D 6/6 2 3/3 6X, 15X   Narrative: The patient tolerated radiation therapy relatively well. She developed fatigue and anticipated skin changes in the treatment field. She also developed esophagitis during radiation.  Plan: The patient will receive a call in about one month from the radiation oncology department. She will continue follow up with Dr. Julien Nordmann as well.   ________________________________________________    Carola Rhine, [facility]

## 2021-12-11 ENCOUNTER — Ambulatory Visit
Admission: RE | Admit: 2021-12-11 | Discharge: 2021-12-11 | Disposition: A | Discharge status: Home / Self Care | Payer: Medicare HMO | Source: Ambulatory Visit | Attending: Radiation Oncology | Admitting: Radiation Oncology

## 2021-12-11 ENCOUNTER — Other Ambulatory Visit: Payer: Self-pay

## 2021-12-11 DIAGNOSIS — M129 Arthropathy, unspecified: Secondary | ICD-10-CM | POA: Diagnosis not present

## 2021-12-11 DIAGNOSIS — Z510 Encounter for antineoplastic radiation therapy: Secondary | ICD-10-CM | POA: Diagnosis not present

## 2021-12-11 DIAGNOSIS — C3411 Malignant neoplasm of upper lobe, right bronchus or lung: Secondary | ICD-10-CM | POA: Diagnosis not present

## 2021-12-11 DIAGNOSIS — I700 Atherosclerosis of aorta: Secondary | ICD-10-CM | POA: Diagnosis not present

## 2021-12-11 DIAGNOSIS — K7689 Other specified diseases of liver: Secondary | ICD-10-CM | POA: Diagnosis not present

## 2021-12-11 DIAGNOSIS — J449 Chronic obstructive pulmonary disease, unspecified: Secondary | ICD-10-CM | POA: Diagnosis not present

## 2021-12-11 DIAGNOSIS — R21 Rash and other nonspecific skin eruption: Secondary | ICD-10-CM | POA: Diagnosis not present

## 2021-12-11 DIAGNOSIS — I809 Phlebitis and thrombophlebitis of unspecified site: Secondary | ICD-10-CM | POA: Diagnosis not present

## 2021-12-11 DIAGNOSIS — Z5111 Encounter for antineoplastic chemotherapy: Secondary | ICD-10-CM | POA: Diagnosis not present

## 2021-12-11 LAB — RAD ONC ARIA SESSION SUMMARY
Course Elapsed Days: 45
Plan Fractions Treated to Date: 2
Plan Prescribed Dose Per Fraction: 2 Gy
Plan Total Fractions Prescribed: 3
Plan Total Prescribed Dose: 6 Gy
Reference Point Dosage Given to Date: 4 Gy
Reference Point Session Dosage Given: 2 Gy
Session Number: 32

## 2021-12-12 ENCOUNTER — Ambulatory Visit
Admission: RE | Admit: 2021-12-12 | Discharge: 2021-12-12 | Disposition: A | Discharge status: Home / Self Care | Payer: Medicare HMO | Source: Ambulatory Visit | Attending: Radiation Oncology | Admitting: Radiation Oncology

## 2021-12-12 ENCOUNTER — Encounter: Payer: Self-pay | Admitting: Radiation Oncology

## 2021-12-12 ENCOUNTER — Other Ambulatory Visit: Payer: Self-pay

## 2021-12-12 DIAGNOSIS — I2510 Atherosclerotic heart disease of native coronary artery without angina pectoris: Secondary | ICD-10-CM | POA: Diagnosis not present

## 2021-12-12 DIAGNOSIS — Z79624 Long term (current) use of inhibitors of nucleotide synthesis: Secondary | ICD-10-CM | POA: Diagnosis not present

## 2021-12-12 DIAGNOSIS — Z7951 Long term (current) use of inhaled steroids: Secondary | ICD-10-CM | POA: Insufficient documentation

## 2021-12-12 DIAGNOSIS — R21 Rash and other nonspecific skin eruption: Secondary | ICD-10-CM | POA: Diagnosis not present

## 2021-12-12 DIAGNOSIS — M129 Arthropathy, unspecified: Secondary | ICD-10-CM | POA: Diagnosis not present

## 2021-12-12 DIAGNOSIS — Z510 Encounter for antineoplastic radiation therapy: Secondary | ICD-10-CM | POA: Insufficient documentation

## 2021-12-12 DIAGNOSIS — J449 Chronic obstructive pulmonary disease, unspecified: Secondary | ICD-10-CM | POA: Insufficient documentation

## 2021-12-12 DIAGNOSIS — E7800 Pure hypercholesterolemia, unspecified: Secondary | ICD-10-CM | POA: Diagnosis not present

## 2021-12-12 DIAGNOSIS — Z79899 Other long term (current) drug therapy: Secondary | ICD-10-CM | POA: Insufficient documentation

## 2021-12-12 DIAGNOSIS — Z5111 Encounter for antineoplastic chemotherapy: Secondary | ICD-10-CM | POA: Diagnosis not present

## 2021-12-12 DIAGNOSIS — K7689 Other specified diseases of liver: Secondary | ICD-10-CM | POA: Insufficient documentation

## 2021-12-12 DIAGNOSIS — I700 Atherosclerosis of aorta: Secondary | ICD-10-CM | POA: Insufficient documentation

## 2021-12-12 DIAGNOSIS — I809 Phlebitis and thrombophlebitis of unspecified site: Secondary | ICD-10-CM | POA: Diagnosis not present

## 2021-12-12 DIAGNOSIS — Z9981 Dependence on supplemental oxygen: Secondary | ICD-10-CM | POA: Insufficient documentation

## 2021-12-12 DIAGNOSIS — C3411 Malignant neoplasm of upper lobe, right bronchus or lung: Secondary | ICD-10-CM | POA: Insufficient documentation

## 2021-12-12 DIAGNOSIS — Z7982 Long term (current) use of aspirin: Secondary | ICD-10-CM | POA: Insufficient documentation

## 2021-12-12 LAB — RAD ONC ARIA SESSION SUMMARY
Course Elapsed Days: 46
Plan Fractions Treated to Date: 3
Plan Prescribed Dose Per Fraction: 2 Gy
Plan Total Fractions Prescribed: 3
Plan Total Prescribed Dose: 6 Gy
Reference Point Dosage Given to Date: 6 Gy
Reference Point Session Dosage Given: 2 Gy
Session Number: 33

## 2021-12-15 ENCOUNTER — Encounter: Payer: Self-pay | Admitting: Internal Medicine

## 2021-12-18 ENCOUNTER — Telehealth: Payer: Self-pay | Admitting: Emergency Medicine

## 2021-12-18 NOTE — Telephone Encounter (Signed)
Joanne Rogers,  I have looked in Dr [facility] cabinet and I am not seeing anything from [facility] for this patient. I have asked them to refax the application to you and attn it to you or Dr Lamonte Sakai.  JUST FYI

## 2022-01-01 ENCOUNTER — Inpatient Hospital Stay: Payer: Medicare HMO | Attending: Physician Assistant

## 2022-01-01 ENCOUNTER — Ambulatory Visit (HOSPITAL_COMMUNITY)
Admission: RE | Admit: 2022-01-01 | Discharge: 2022-01-01 | Disposition: A | Discharge status: Home / Self Care | Payer: Medicare HMO | Source: Ambulatory Visit | Attending: Physician Assistant | Admitting: Physician Assistant

## 2022-01-01 DIAGNOSIS — C3491 Malignant neoplasm of unspecified part of right bronchus or lung: Secondary | ICD-10-CM | POA: Diagnosis not present

## 2022-01-01 DIAGNOSIS — J432 Centrilobular emphysema: Secondary | ICD-10-CM | POA: Diagnosis not present

## 2022-01-01 DIAGNOSIS — C3490 Malignant neoplasm of unspecified part of unspecified bronchus or lung: Secondary | ICD-10-CM | POA: Diagnosis not present

## 2022-01-01 DIAGNOSIS — Z9981 Dependence on supplemental oxygen: Secondary | ICD-10-CM | POA: Insufficient documentation

## 2022-01-01 DIAGNOSIS — C3411 Malignant neoplasm of upper lobe, right bronchus or lung: Secondary | ICD-10-CM

## 2022-01-01 DIAGNOSIS — Z923 Personal history of irradiation: Secondary | ICD-10-CM | POA: Insufficient documentation

## 2022-01-01 LAB — CBC WITH DIFFERENTIAL (CANCER CENTER ONLY)
Abs Immature Granulocytes: 0.01 10*3/uL (ref 0.00–0.07)
Basophils Absolute: 0 10*3/uL (ref 0.0–0.1)
Basophils Relative: 0 %
Eosinophils Absolute: 0 10*3/uL (ref 0.0–0.5)
Eosinophils Relative: 1 %
HCT: 33.5 % — ABNORMAL LOW (ref 36.0–46.0)
Hemoglobin: 11.2 g/dL — ABNORMAL LOW (ref 12.0–15.0)
Immature Granulocytes: 0 %
Lymphocytes Relative: 13 %
Lymphs Abs: 0.7 10*3/uL (ref 0.7–4.0)
MCH: 34.7 pg — ABNORMAL HIGH (ref 26.0–34.0)
MCHC: 33.4 g/dL (ref 30.0–36.0)
MCV: 103.7 fL — ABNORMAL HIGH (ref 80.0–100.0)
Monocytes Absolute: 0.7 10*3/uL (ref 0.1–1.0)
Monocytes Relative: 12 %
Neutro Abs: 4 10*3/uL (ref 1.7–7.7)
Neutrophils Relative %: 74 %
Platelet Count: 202 10*3/uL (ref 150–400)
RBC: 3.23 MIL/uL — ABNORMAL LOW (ref 3.87–5.11)
RDW: 15.1 % (ref 11.5–15.5)
WBC Count: 5.4 10*3/uL (ref 4.0–10.5)
nRBC: 0 % (ref 0.0–0.2)

## 2022-01-01 LAB — CMP (CANCER CENTER ONLY)
ALT: 19 U/L (ref 0–44)
AST: 18 U/L (ref 15–41)
Albumin: 4 g/dL (ref 3.5–5.0)
Alkaline Phosphatase: 83 U/L (ref 38–126)
Anion gap: 3 — ABNORMAL LOW (ref 5–15)
BUN: 14 mg/dL (ref 8–23)
CO2: 35 mmol/L — ABNORMAL HIGH (ref 22–32)
Calcium: 10.1 mg/dL (ref 8.9–10.3)
Chloride: 100 mmol/L (ref 98–111)
Creatinine: 0.58 mg/dL (ref 0.44–1.00)
GFR, Estimated: >60 mL/min (ref >60)
Glucose, Bld: 108 mg/dL — ABNORMAL HIGH (ref 70–99)
Potassium: 4.3 mmol/L (ref 3.5–5.1)
Sodium: 138 mmol/L (ref 135–145)
Total Bilirubin: 0.5 mg/dL (ref 0.3–1.2)
Total Protein: 7.2 g/dL (ref 6.5–8.1)

## 2022-01-01 MED ORDER — IOHEXOL 300 MG/ML  SOLN
75.0000 mL | Freq: Once | INTRAMUSCULAR | Status: AC | PRN
  Administered 2022-01-01: 75 mL via INTRAVENOUS

## 2022-01-01 MED ORDER — SODIUM CHLORIDE (PF) 0.9 % IJ SOLN
INTRAMUSCULAR | Status: AC
  Filled 2022-01-01: qty 50

## 2022-01-02 ENCOUNTER — Other Ambulatory Visit (HOSPITAL_COMMUNITY): Payer: Medicare HMO

## 2022-01-07 NOTE — Progress Notes (Signed)
[place] OFFICE PROGRESS NOTE  Joanne Smoker, MD [place] [address]  DIAGNOSIS: Unresectable stage IIb (T1c, No/N1, M0) non-small cell lung cancer, adenocarcinoma.  The patient presented with right upper lobe groundglass lesion and hypermetabolic right hilar lymph node.  She was diagnosed in September 2023.     PRIOR THERAPY: Concurrent chemoradiation with weekly carboplatin for AUC of 2 and paclitaxel 45 Mg/M2. Last dose on 12/08/21  Status post 7 cycles.    CURRENT THERAPY: Observation     INTERVAL HISTORY: Joanne Rogers 75 y.o. female returns to the clinic today for a follow-up visit accompanied by her daughter. She completed her concurrent chemoradiation. Her last day of radiation was on 12/1. She tolerated treatment well with only mild odynophagia.   Her, over the course of the last few weeks despite finishing treatment she is actually had worsening fatigue.  She also reports she has had a hoarse voice.  Today she denies any fever, chills, night sweats, or unexplained weight loss.  He has some occasional dyspnea.  Denies any cough, hemoptysis, or chest pain.  Denies any sinus drainage.  Denies any signs and symptoms of infection. Denies any nausea, vomiting, constipation, or diarrhea. She is up to date on her staging brain MRI.  He reports 1 episode of a headache in the interval.  She does report some memory changes.  She recently had a restaging CT scan performed.  She is here today for evaluation to review her scan results and for more detailed discussion about her current condition and treatment options.  MEDICAL HISTORY: Past Medical History:  Diagnosis Date   Arthritis    back, hands, neck   COPD (chronic obstructive pulmonary disease) ([place])    Coronary artery disease 11/28/2019   CCTA 11/28/19: Mild non-obstructive CAD, myocardial bridge mid LAD (cardiologist Dr. Standley Dakins, 08/19/21)   DDD (degenerative disc disease), lumbar     Dyspnea    uses oxygen at home   H/O bronchitis    allergy related   Hypercholesterolemia    Oxygen dependent    Palpitations    "fluttering"   Pneumonia    hx of    ALLERGIES:  is allergic to codeine, amoxicillin, latex, levofloxacin, and statins.  MEDICATIONS:  Current Outpatient Medications  Medication Sig Dispense Refill   acetaminophen (TYLENOL) 500 MG tablet Take 1,000 mg by mouth every 6 (six) hours as needed for moderate pain.     albuterol (VENTOLIN HFA) 108 (90 Base) MCG/ACT inhaler Inhale 1-2 puffs into the lungs every 4 (four) hours as needed for wheezing or shortness of breath. 1 Inhaler 5   ascorbic acid (VITAMIN C) 500 MG tablet Take 500 mg by mouth daily.     aspirin 81 MG tablet Take 81 mg by mouth at bedtime.     azelastine (ASTELIN) 0.1 % nasal spray Place 1 spray into both nostrils as needed for rhinitis.     beta carotene w/minerals (OCUVITE) tablet Take 1 tablet by mouth at bedtime.     bisoprolol (ZEBETA) 5 MG tablet Take 1 tablet (5 mg total) by mouth daily. 90 tablet 3   cetirizine (ZYRTEC) 10 MG tablet Take 10 mg by mouth at bedtime.     cholecalciferol (VITAMIN D3) 25 MCG (1000 UNIT) tablet Take 1,000 Units by mouth daily.     Cyanocobalamin (B-12) 2500 MCG TABS Take 2,500 mcg by mouth daily.     famotidine (PEPCID) 20 MG tablet One at bedtime 30  tablet 11   ibuprofen (ADVIL) 200 MG tablet Take 400 mg by mouth every 6 (six) hours as needed for moderate pain.     ipratropium-albuterol (DUONEB) 0.5-2.5 (3) MG/3ML SOLN Take 3 mLs by nebulization every 4 (four) hours. Dx: J44.9 360 mL 1   Multiple Minerals-Vitamins (CAL MAG ZINC +D3) TABS Take 1-2 tablets by mouth See admin instructions. Take 1 tablet in the morning and 2 tablets in the evening     OXYGEN 2lpm with sleep     Polyethyl Glycol-Propyl Glycol (SYSTANE OP) Place 1 drop into both eyes daily as needed (dry eyes).     prochlorperazine (COMPAZINE) 10 MG tablet Take 1 tablet (10 mg total) by mouth every  6 (six) hours as needed. 30 tablet 2   Respiratory Therapy Supplies (FLUTTER) DEVI 1 Device by Does not apply route as needed. 1 each 0   SYMBICORT 160-4.5 MCG/ACT inhaler Inhale 2 puffs into the lungs in the morning and at bedtime. 10.2 g 11   Tiotropium Bromide Monohydrate (SPIRIVA RESPIMAT) 2.5 MCG/ACT AERS INHALE 2 PUFFS INTO LUNGS ONCE DAILY 4 g 5   valACYclovir (VALTREX) 1000 MG tablet Take 500 mg by mouth every other day.      No current facility-administered medications for this visit.    SURGICAL HISTORY:  Past Surgical History:  Procedure Laterality Date   BRONCHIAL BIOPSY  09/29/2021   Procedure: BRONCHIAL BIOPSIES;  Surgeon: Collene Gobble, MD;  Location: [facility] ENDOSCOPY;  Service: Pulmonary;;   BRONCHIAL BRUSHINGS  09/29/2021   Procedure: BRONCHIAL BRUSHINGS;  Surgeon: Collene Gobble, MD;  Location: [facility] ENDOSCOPY;  Service: Pulmonary;;   BRONCHIAL NEEDLE ASPIRATION BIOPSY  09/29/2021   Procedure: BRONCHIAL NEEDLE ASPIRATION BIOPSIES;  Surgeon: Collene Gobble, MD;  Location: [facility] ENDOSCOPY;  Service: Pulmonary;;   CARDIOVASCULAR STRESS TEST  11/16/2005   EF 70%, NO ISCHEMIA   CARPAL TUNNEL RELEASE Right ~1988   DILATION AND CURETTAGE OF UTERUS     x3: x2 for miscarrages and x1 for polyp   HEMI-MICRODISCECTOMY LUMBAR LAMINECTOMY LEVEL 1 Left 07/20/2014   Procedure: centeral decompression,L4-L5, HEMI-LAMINECTOMY MICRODISCECTOMY L4 - L5 ON THE LEFT  LEVEL 1, forimatomy 4-5 root on the left;  Surgeon: Latanya Maudlin, MD;  Location: [facility] ORS;  Service: Orthopedics;  Laterality: Left;   US ECHOCARDIOGRAPHY  11/19/2005   EF 55-60%   VIDEO BRONCHOSCOPY WITH ENDOBRONCHIAL ULTRASOUND Right 09/29/2021   Procedure: VIDEO BRONCHOSCOPY WITH ENDOBRONCHIAL ULTRASOUND;  Surgeon: Collene Gobble, MD;  Location: [facility] ENDOSCOPY;  Service: Pulmonary;  Laterality: Right;   VIDEO BRONCHOSCOPY WITH RADIAL ENDOBRONCHIAL ULTRASOUND  09/29/2021   Procedure: VIDEO BRONCHOSCOPY WITH RADIAL ENDOBRONCHIAL ULTRASOUND;   Surgeon: Collene Gobble, MD;  Location: [facility] ENDOSCOPY;  Service: Pulmonary;;    REVIEW OF SYSTEMS:   Review of Systems  Constitutional: Positive for fatigue. Negative for appetite change, chills, fever and unexpected weight change.  HENT: Negative for mouth sores, nosebleeds, sore throat and trouble swallowing.   Eyes: Negative for eye problems and icterus.  Respiratory: Positive for some shallow breathing. Negative for cough, hemoptysis, shortness of breath and wheezing.   Cardiovascular: Negative for chest pain and leg swelling.  Gastrointestinal: Negative for abdominal pain, constipation, diarrhea, nausea and vomiting.  Genitourinary: Negative for bladder incontinence, difficulty urinating, dysuria, frequency and hematuria.   Musculoskeletal: Negative for back pain, gait problem, neck pain and neck stiffness.  Skin: Negative for itching and rash.  Neurological: Negative for dizziness, extremity weakness, gait problem, headaches, light-headedness and seizures.  Hematological:  Negative for adenopathy. Does not bruise/bleed easily.  Psychiatric/Behavioral: Positive for reported periods of confusion.  Negative for  depression and sleep disturbance. The patient is not nervous/anxious.     PHYSICAL EXAMINATION:  There were no vitals taken for this visit.  ECOG PERFORMANCE STATUS: 1  Physical Exam  Constitutional: Oriented to person, place, and time and well-developed, well-nourished, and in no distress.  HENT:  Head: Normocephalic and atraumatic.  Mouth/Throat: Oropharynx is clear and moist. No oropharyngeal exudate.  Eyes: Conjunctivae are normal. Right eye exhibits no discharge. Left eye exhibits no discharge. No scleral icterus.  Neck: Normal range of motion. Neck supple.  Cardiovascular: Normal rate, regular rhythm, normal heart sounds and intact distal pulses.   Pulmonary/Chest: Effort normal and breath sounds normal. No respiratory distress. No wheezes. No rales.  Abdominal: Soft.  Bowel sounds are normal. Exhibits no distension and no mass. There is no tenderness.  Musculoskeletal: Normal range of motion. Exhibits no edema.  Lymphadenopathy:    No cervical adenopathy.  Neurological: Alert and oriented to person, place, and time. Exhibits normal muscle tone. Gait normal. Coordination normal.  Skin: Skin is warm and dry. No rash noted. Not diaphoretic. No erythema. No pallor.  Psychiatric: Mood, memory and judgment normal.  Vitals reviewed.  LABORATORY DATA: Lab Results  Component Value Date   WBC 5.4 01/01/2022   HGB 11.2 (L) 01/01/2022   HCT 33.5 (L) 01/01/2022   MCV 103.7 (H) 01/01/2022   PLT 202 01/01/2022      Chemistry      Component Value Date/Time   NA 138 01/01/2022 1053   NA 137 05/08/2020 0909   K 4.3 01/01/2022 1053   CL 100 01/01/2022 1053   CO2 35 (H) 01/01/2022 1053   BUN 14 01/01/2022 1053   BUN 8 05/08/2020 0909   CREATININE 0.58 01/01/2022 1053      Component Value Date/Time   CALCIUM 10.1 01/01/2022 1053   ALKPHOS 83 01/01/2022 1053   AST 18 01/01/2022 1053   ALT 19 01/01/2022 1053   BILITOT 0.5 01/01/2022 1053       RADIOGRAPHIC STUDIES:  CT Chest W Contrast  Result Date: 01/05/2022 CLINICAL DATA:  Non-small cell lung cancer, assess treatment response * Tracking Code: BO * EXAM: CT CHEST WITH CONTRAST TECHNIQUE: Multidetector CT imaging of the chest was performed during intravenous contrast administration. RADIATION DOSE REDUCTION: This exam was performed according to the departmental dose-optimization program which includes automated exposure control, adjustment of the mA and/or kV according to patient size and/or use of iterative reconstruction technique. CONTRAST:  33mL OMNIPAQUE IOHEXOL 300 MG/ML  SOLN COMPARISON:  09/17/2021 FINDINGS: Cardiovascular: Aortic atherosclerosis. Normal heart size. Left coronary artery calcifications. No pericardial effusion. Mediastinum/Nodes: Interval enlargement of a right hilar lymph node  measuring 2.1 x 1.0 cm, previously 1.4 x 0.9 cm (series 2, image 65). Additional enlarged mediastinal or hilar lymph nodes. Thyroid gland, trachea, and esophagus demonstrate no significant findings. Lungs/Pleura: Minimal centrilobular and paraseptal emphysema. Diffuse bilateral bronchial wall thickening. Background of very fine centrilobular nodularity, most concentrated in the lung apices. Increasingly dense subsolid lesion of the perihilar right upper lobe, which is diminished in size, measuring 2.0 x 1.5 cm, previously 2.7 x 2.1 cm (series 7, image 65). Additional small, scattered nonspecific ground-glass opacities throughout the lungs, unchanged, for example in the peripheral right upper lobe measuring 0.8 cm (series 7, image 87) and in the right lower lobe measuring 1.0 x 0.8 cm (series 7, image 92). No pleural effusion  or pneumothorax. Upper Abdomen: No acute abnormality. Musculoskeletal: No chest wall abnormality. No acute osseous findings. IMPRESSION: 1. Subsolid lesion of the perihilar right upper lobe is diminished in size, however increased in visual appearance of solid character. 2. Interval enlargement of a right hilar lymph node, previously FDG avid. 3. These findings may reflect pseudoprogression in the immediate interval following initiation of therapy, especially given decrease in size of primary lesion, however true disease progression is a significant possibility. Attention on follow-up. 4. Minimal emphysema and diffuse bilateral bronchial wall thickening. Background of very fine centrilobular nodularity, most concentrated in the lung apices, consistent with smoking-related respiratory bronchiolitis. 5. Coronary artery disease. Aortic Atherosclerosis (ICD10-I70.0) and Emphysema (ICD10-J43.9). Electronically Signed   By: Delanna Ahmadi M.D.   On: 01/05/2022 21:36     ASSESSMENT/PLAN:  This is a very pleasant 76 year old Caucasian female with unresectable stage II (T1c, N0/N1, M0) non-small cell  lung cancer, adenocarcinoma.  The patient presented with right upper lobe groundglass lesion and hypermetabolic right hilar lymph node.  She was diagnosed in September 2023.    She completed a course of chemoradiation with carboplatin for an AUC of 2 and paclitaxel 45 mg per metered square.  The patient status post 7 cycles.  Last day of chemo was on 12/08/2021.  The patient recently had a restaging CT scan performed.  Dr. Julien Nordmann personally and independently reviewed the scan and discussed the results with the patient today.  The scan showed that the primary right perihilar lesion is diminished in size however has increased somewhat and solid character.  There was also an interval enlargement of the right hilar lymph node.  The radiologist felt that these findings could reflect pseudo progression however cannot rule out disease progression.  Therefore, Dr. Julien Nordmann is hesitant to start the patient on systemic chemotherapy at this time without clear evidence of true disease progression.  Therefore Dr. Julien Nordmann recommends a close interval follow-up CT scan of the chest in 2 months.  We will see the patient back for follow-up visit a few days after her CT scan to review the results.  Dr. Julien Nordmann feels that the patient hoarseness, fatigue, and memory concerns may be related to fatigue and lack of concentration after completing her course of therapy.  Also believes her hoarseness may be secondary to her radiation and expect this to improve.  And that the patient had a staging brain MRI approximately 2 months ago, it is unlikely that the patient would have developed metastatic disease in this time.  We will not repeat brain MRI at this time.  Patient was instructed to increase her activity to prevent deconditioning.  However, Dr. Julien Nordmann did caution the patient if she develops any new or worsening symptoms in the interval to please reach out to [place] for reevaluation.   The patient was advised to call  immediately if she has any concerning symptoms in the interval. The patient voices understanding of current disease status and treatment options and is in agreement with the current care plan. All questions were answered. The patient knows to call the clinic with any problems, questions or concerns. We can certainly see the patient much sooner if necessary    No orders of the defined types were placed in this encounter.    Mattelyn Imhoff L Jaliya Siegmann, PA-C 01/07/22  ADDENDUM: Hematology/Oncology Attending:  I had a face-to-face encounter with the patient today.  I reviewed her record, lab, scan and recommended her care plan.  This is a very pleasant 76  years old white female with unresectable stage IIb (T1c, N1, M0) non-small cell lung cancer, adenocarcinoma presented with right upper lobe groundglass lesion as well as hypermetabolic right hilar lymph node diagnosed in September 2023.  The patient underwent a course of concurrent chemoradiation with weekly carboplatin and paclitaxel for 7 cycles.  Her last dose was given December 08, 2021. The patient continues to have some hoarseness of her voice and worsening fatigue. She had repeat CT scan of the chest performed recently.  I personally and independently reviewed the scan images and discussed the results and showed the images to the patient and her daughter.  Her scan showed the subsolid lesion of the perihilar right upper lobe diminished in size but increased in visual appearance of the solid character which could be a result from the radiotherapy.  She also had slight interval increase in the right hilar lymph node that was previously FDG avid.  This result could be suspicious for pseudo progression in the immediate interval after the treatment.  I also explained to the patient that there could be some disease progression but currently it is very mild if any. I recommended for her to have repeat CT scan of the chest in around 2 months for now for  further evaluation of this lesion and to make sure that the patient has not developed any disease progression in these lesions.  It is probably not a good idea to start her on any additional treatment until we have a confirmation of disease progression. If there is any further increase in the right upper lobe lung nodule or the right hilar lymph nodes, I will discuss with the patient other treatment options. The patient was advised to call immediately if she has any other concerning symptoms in the interval. The total time spent in the appointment was 30 minutes. Disclaimer: This note was dictated with voice recognition software. Similar sounding words can inadvertently be transcribed and may be missed upon review. Eilleen Kempf, MD

## 2022-01-08 ENCOUNTER — Inpatient Hospital Stay (HOSPITAL_BASED_OUTPATIENT_CLINIC_OR_DEPARTMENT_OTHER): Payer: Medicare HMO | Admitting: Physician Assistant

## 2022-01-08 VITALS — BP 122/87 | HR 84 | Temp 97.6°F | Resp 16 | Wt 159.1 lb

## 2022-01-08 DIAGNOSIS — C3411 Malignant neoplasm of upper lobe, right bronchus or lung: Secondary | ICD-10-CM | POA: Diagnosis not present

## 2022-01-08 DIAGNOSIS — Z9981 Dependence on supplemental oxygen: Secondary | ICD-10-CM | POA: Diagnosis not present

## 2022-01-08 DIAGNOSIS — Z923 Personal history of irradiation: Secondary | ICD-10-CM | POA: Diagnosis not present

## 2022-01-08 DIAGNOSIS — C3491 Malignant neoplasm of unspecified part of right bronchus or lung: Secondary | ICD-10-CM

## 2022-01-08 NOTE — Telephone Encounter (Signed)
[employer] no longer offers patient assistance for Symbicort.   Called [facility] to speak with Caryl Pina. Someone answered the phone but did not say anything.   Will close encounter.

## 2022-01-09 ENCOUNTER — Encounter: Payer: Self-pay | Admitting: Internal Medicine

## 2022-01-09 ENCOUNTER — Telehealth: Payer: Self-pay | Admitting: Internal Medicine

## 2022-01-09 DIAGNOSIS — G8929 Other chronic pain: Secondary | ICD-10-CM | POA: Diagnosis not present

## 2022-01-09 DIAGNOSIS — M8580 Other specified disorders of bone density and structure, unspecified site: Secondary | ICD-10-CM | POA: Diagnosis not present

## 2022-01-09 DIAGNOSIS — E7800 Pure hypercholesterolemia, unspecified: Secondary | ICD-10-CM | POA: Diagnosis not present

## 2022-01-09 DIAGNOSIS — I5032 Chronic diastolic (congestive) heart failure: Secondary | ICD-10-CM | POA: Diagnosis not present

## 2022-01-09 DIAGNOSIS — J449 Chronic obstructive pulmonary disease, unspecified: Secondary | ICD-10-CM | POA: Diagnosis not present

## 2022-01-09 NOTE — Telephone Encounter (Signed)
Called patient to schedule f/u. Patient notified and mailing patient calendar.

## 2022-01-10 ENCOUNTER — Other Ambulatory Visit: Payer: Self-pay

## 2022-01-23 ENCOUNTER — Telehealth: Payer: Self-pay | Admitting: Emergency Medicine

## 2022-01-23 NOTE — Telephone Encounter (Signed)
[facility] calling.   PT saw their Pharmacist  today. Issues with high cost of RXCan she switch to [employer] and drop Breva and Symbicort.  PT states she is feeling good on Melodie Bouillon [phone]

## 2022-01-23 NOTE — Telephone Encounter (Signed)
Called and spoke with patient directly. She stated that she had received a sample of Breztri from her PCP's office and so far she likes the [place] better. She likes the fact that Markus Daft is just one inhaler versus having to use Spiriva and Symbicort.   Pharmacy is [facility] [address].   RB, please advise if you are ok with her switching to the [place]. Thanks!

## 2022-01-24 NOTE — Telephone Encounter (Signed)
Yes agree with the change to [facility]

## 2022-01-26 ENCOUNTER — Ambulatory Visit
Admission: RE | Admit: 2022-01-26 | Discharge: 2022-01-26 | Disposition: A | Discharge status: Home / Self Care | Payer: Medicare HMO | Source: Ambulatory Visit | Attending: Radiation Oncology | Admitting: Radiation Oncology

## 2022-01-26 MED ORDER — BREZTRI AEROSPHERE 160-9-4.8 MCG/ACT IN AERO: 2.0000 | INHALATION_SPRAY | Freq: Two times a day (BID) | RESPIRATORY_TRACT | 3 refills | Status: DC

## 2022-01-26 NOTE — Telephone Encounter (Signed)
Spoke to patient advised script of breztri has been sent to pharmacy. Nothing further needed.

## 2022-01-26 NOTE — Progress Notes (Signed)
  Radiation Oncology         [phone] ________________________________  Name: Joanne Rogers MRN: [id number]  Date of Service: 01/26/2022  DOB: [birth year]  Post Treatment Telephone Note  Diagnosis:   At least Stage IIB, cT1cN0-1M0, NSCLC, adenocarcinoma of the RUL   Intent: Curative  Radiation Treatment Dates: 10/27/2021 through 12/12/2021 Site Technique Total Dose (Gy) Dose per Fx (Gy) Completed Fx Beam Energies  Lung, Right: Lung_R 3D 60/60 2 30/30 6X, 15X  Lung, Right: Lung_R_Bst 3D 6/6 2 3/3 6X, 15X  (as documented in provider EOT note)   The patient was available for call today.   Symptoms of fatigue have improved since completing therapy.  Symptoms of skin changes have improved since completing therapy.  Symptoms of esophagitis have improved since completing therapy.   The patient has scheduled follow up with his medical oncologist Dr. Si Gaul for ongoing care, and was encouraged to call if she develops concerns or questions regarding radiation.  This concludes the interview.   Ruel Favors, LPN

## 2022-02-05 IMAGING — DX DG CHEST 2V
2 series · 2 of 2 positions shown · non-contrast
Comparison: October 07, 2017

CLINICAL DATA: Chest pain.

EXAM:
CHEST - 2 VIEW

[chest pa]
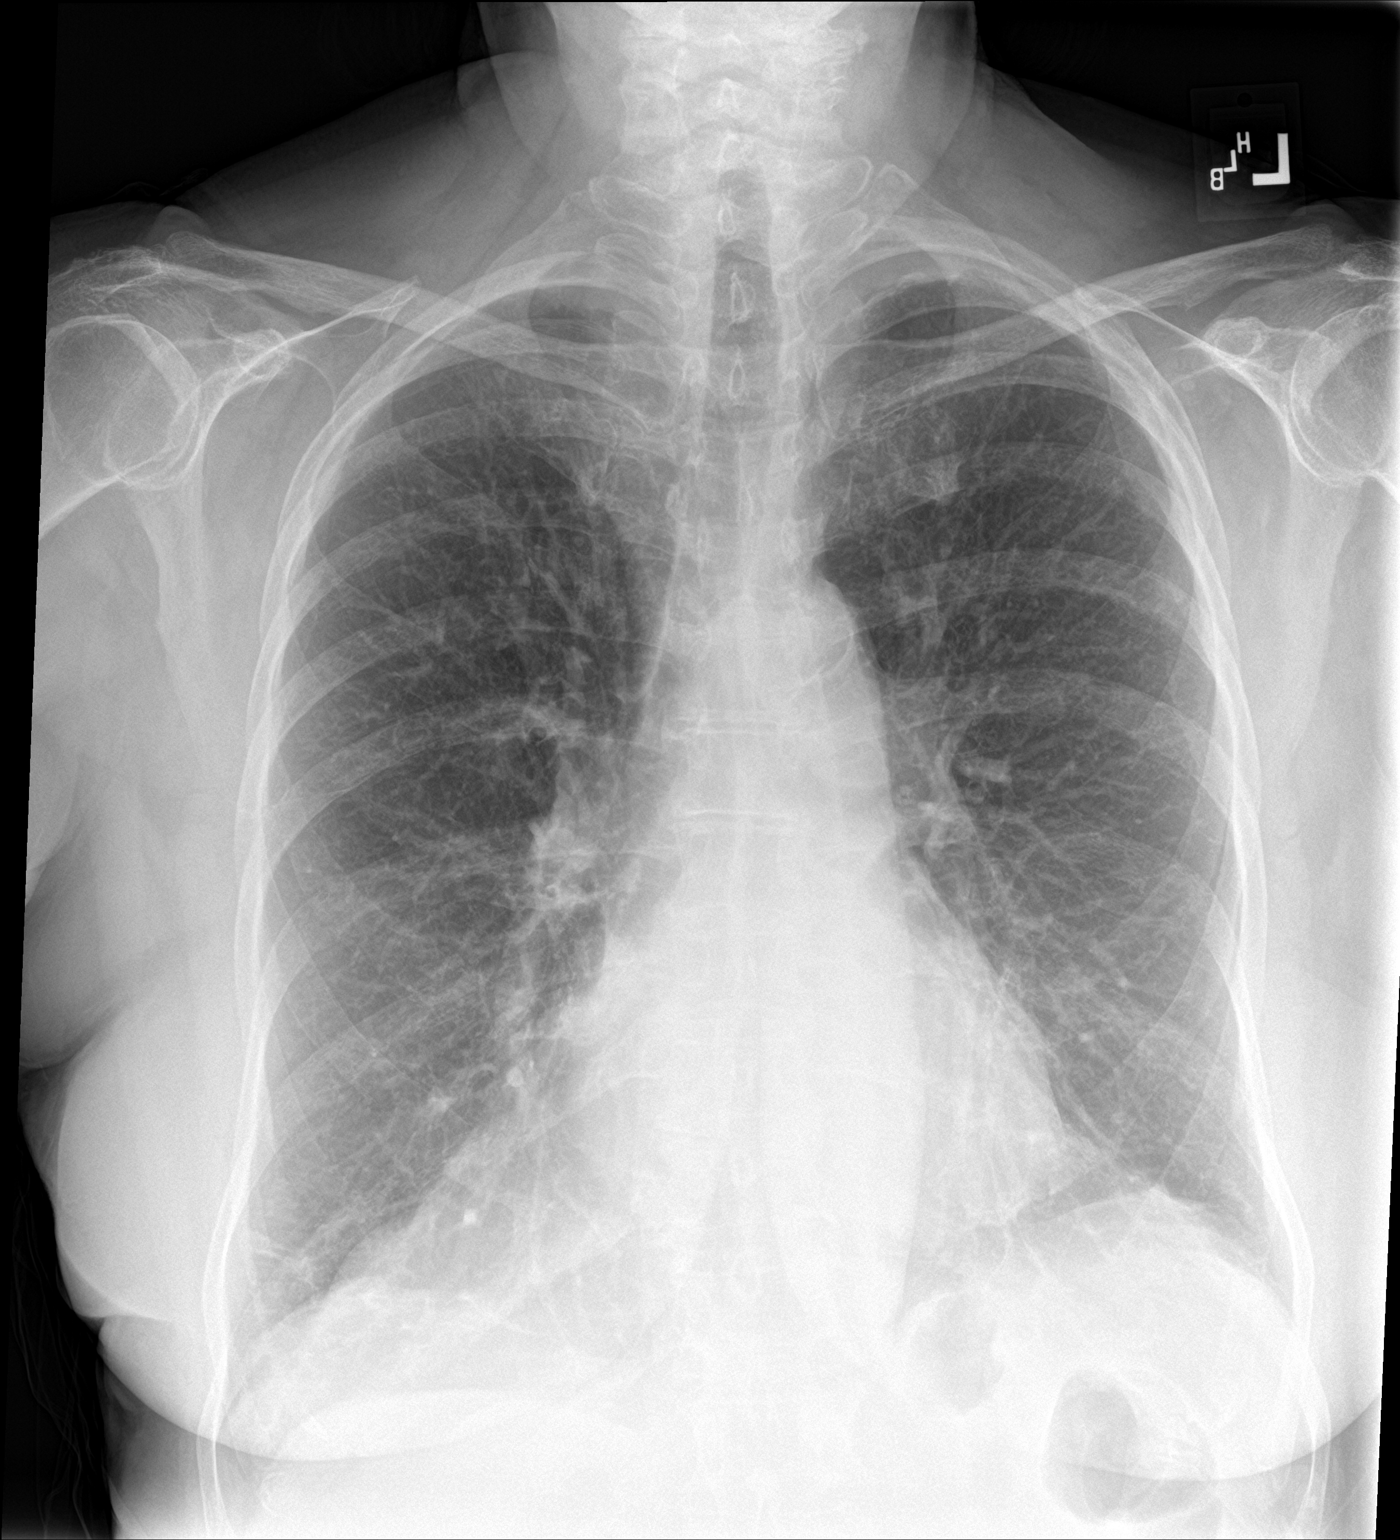

[chest lat]
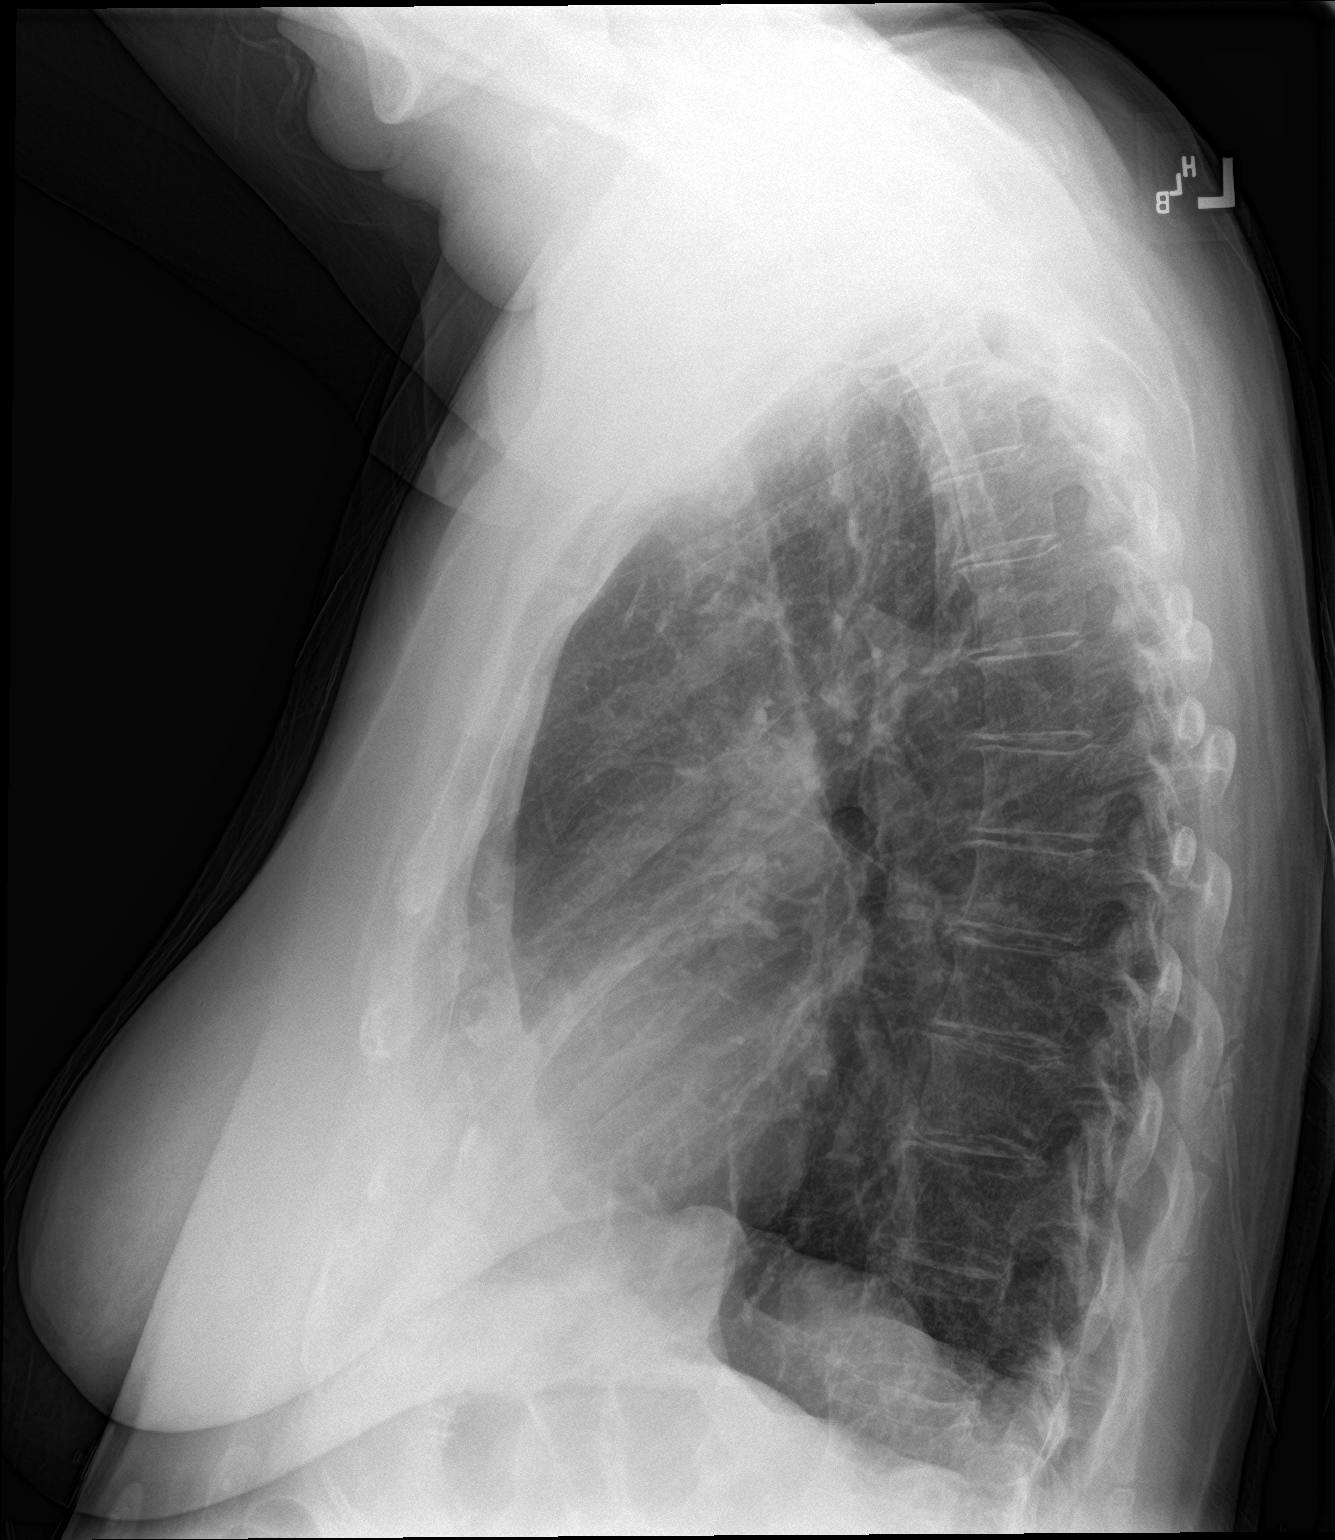

[2 of 2 positions shown; findings below may reference images not displayed]

FINDINGS: The lungs are hyperinflated. Mild diffuse chronic appearing
increased interstitial lung markings are noted. There is mild,
stable right basilar linear scarring. There is no evidence of acute
infiltrate, pleural effusion or pneumothorax. The heart size and
mediastinal contours are within normal limits. There is mild
calcification of the aortic arch. The visualized skeletal structures
are unremarkable.
IMPRESSION: Chronic appearing increased interstitial lung markings without
evidence of acute or active cardiopulmonary disease.

## 2022-02-09 ENCOUNTER — Telehealth: Payer: Self-pay | Admitting: Emergency Medicine

## 2022-02-09 MED ORDER — ALBUTEROL SULFATE HFA 108 (90 BASE) MCG/ACT IN AERS: 1.0000 | INHALATION_SPRAY | RESPIRATORY_TRACT | 5 refills | Status: AC | PRN

## 2022-02-09 NOTE — Telephone Encounter (Signed)
Called and spoke to patient and went over that she needed her albuterol refilled. And went over pharmacy with her and got her albuterol refilled. Nothing further needed

## 2022-02-11 DIAGNOSIS — E7800 Pure hypercholesterolemia, unspecified: Secondary | ICD-10-CM | POA: Diagnosis not present

## 2022-02-11 DIAGNOSIS — J449 Chronic obstructive pulmonary disease, unspecified: Secondary | ICD-10-CM | POA: Diagnosis not present

## 2022-02-11 DIAGNOSIS — I5032 Chronic diastolic (congestive) heart failure: Secondary | ICD-10-CM | POA: Diagnosis not present

## 2022-02-11 DIAGNOSIS — G8929 Other chronic pain: Secondary | ICD-10-CM | POA: Diagnosis not present

## 2022-02-11 DIAGNOSIS — M8580 Other specified disorders of bone density and structure, unspecified site: Secondary | ICD-10-CM | POA: Diagnosis not present

## 2022-02-13 ENCOUNTER — Other Ambulatory Visit: Payer: Self-pay

## 2022-02-24 DIAGNOSIS — Z85828 Personal history of other malignant neoplasm of skin: Secondary | ICD-10-CM | POA: Diagnosis not present

## 2022-02-24 DIAGNOSIS — L821 Other seborrheic keratosis: Secondary | ICD-10-CM | POA: Diagnosis not present

## 2022-02-24 DIAGNOSIS — L570 Actinic keratosis: Secondary | ICD-10-CM | POA: Diagnosis not present

## 2022-02-24 DIAGNOSIS — D170 Benign lipomatous neoplasm of skin and subcutaneous tissue of head, face and neck: Secondary | ICD-10-CM | POA: Diagnosis not present

## 2022-02-24 DIAGNOSIS — D225 Melanocytic nevi of trunk: Secondary | ICD-10-CM | POA: Diagnosis not present

## 2022-03-05 ENCOUNTER — Encounter (HOSPITAL_COMMUNITY): Payer: Self-pay

## 2022-03-05 ENCOUNTER — Ambulatory Visit (HOSPITAL_COMMUNITY)
Admission: RE | Admit: 2022-03-05 | Discharge: 2022-03-05 | Disposition: A | Discharge status: Home / Self Care | Payer: Medicare HMO | Source: Ambulatory Visit | Attending: Physician Assistant | Admitting: Physician Assistant

## 2022-03-05 ENCOUNTER — Inpatient Hospital Stay: Payer: Medicare HMO | Attending: Physician Assistant

## 2022-03-05 ENCOUNTER — Inpatient Hospital Stay: Payer: Medicare HMO

## 2022-03-05 DIAGNOSIS — J439 Emphysema, unspecified: Secondary | ICD-10-CM | POA: Diagnosis not present

## 2022-03-05 DIAGNOSIS — J9811 Atelectasis: Secondary | ICD-10-CM | POA: Diagnosis not present

## 2022-03-05 DIAGNOSIS — C3491 Malignant neoplasm of unspecified part of right bronchus or lung: Secondary | ICD-10-CM | POA: Insufficient documentation

## 2022-03-05 DIAGNOSIS — Z923 Personal history of irradiation: Secondary | ICD-10-CM | POA: Diagnosis not present

## 2022-03-05 DIAGNOSIS — Z9221 Personal history of antineoplastic chemotherapy: Secondary | ICD-10-CM | POA: Diagnosis not present

## 2022-03-05 DIAGNOSIS — C3490 Malignant neoplasm of unspecified part of unspecified bronchus or lung: Secondary | ICD-10-CM | POA: Diagnosis not present

## 2022-03-05 DIAGNOSIS — C3411 Malignant neoplasm of upper lobe, right bronchus or lung: Secondary | ICD-10-CM | POA: Diagnosis not present

## 2022-03-05 LAB — CBC WITH DIFFERENTIAL (CANCER CENTER ONLY)
Abs Immature Granulocytes: 0.02 10*3/uL (ref 0.00–0.07)
Basophils Absolute: 0 10*3/uL (ref 0.0–0.1)
Basophils Relative: 0 %
Eosinophils Absolute: 0.1 10*3/uL (ref 0.0–0.5)
Eosinophils Relative: 1 %
HCT: 32.6 % — ABNORMAL LOW (ref 36.0–46.0)
Hemoglobin: 10.8 g/dL — ABNORMAL LOW (ref 12.0–15.0)
Immature Granulocytes: 0 %
Lymphocytes Relative: 12 %
Lymphs Abs: 0.9 10*3/uL (ref 0.7–4.0)
MCH: 33.8 pg (ref 26.0–34.0)
MCHC: 33.1 g/dL (ref 30.0–36.0)
MCV: 101.9 fL — ABNORMAL HIGH (ref 80.0–100.0)
Monocytes Absolute: 0.9 10*3/uL (ref 0.1–1.0)
Monocytes Relative: 11 %
Neutro Abs: 5.9 10*3/uL (ref 1.7–7.7)
Neutrophils Relative %: 76 %
Platelet Count: 252 10*3/uL (ref 150–400)
RBC: 3.2 MIL/uL — ABNORMAL LOW (ref 3.87–5.11)
RDW: 11.7 % (ref 11.5–15.5)
WBC Count: 7.9 10*3/uL (ref 4.0–10.5)
nRBC: 0 % (ref 0.0–0.2)

## 2022-03-05 LAB — CMP (CANCER CENTER ONLY)
ALT: 14 U/L (ref 0–44)
AST: 16 U/L (ref 15–41)
Albumin: 3.7 g/dL (ref 3.5–5.0)
Alkaline Phosphatase: 84 U/L (ref 38–126)
Anion gap: 7 (ref 5–15)
BUN: 9 mg/dL (ref 8–23)
CO2: 33 mmol/L — ABNORMAL HIGH (ref 22–32)
Calcium: 9.8 mg/dL (ref 8.9–10.3)
Chloride: 96 mmol/L — ABNORMAL LOW (ref 98–111)
Creatinine: 0.45 mg/dL (ref 0.44–1.00)
GFR, Estimated: >60 mL/min (ref >60)
Glucose, Bld: 95 mg/dL (ref 70–99)
Potassium: 3.8 mmol/L (ref 3.5–5.1)
Sodium: 136 mmol/L (ref 135–145)
Total Bilirubin: 0.4 mg/dL (ref 0.3–1.2)
Total Protein: 7.4 g/dL (ref 6.5–8.1)

## 2022-03-05 MED ORDER — IOHEXOL 300 MG/ML  SOLN
75.0000 mL | Freq: Once | INTRAMUSCULAR | Status: AC | PRN
  Administered 2022-03-05: 75 mL via INTRAVENOUS

## 2022-03-06 ENCOUNTER — Other Ambulatory Visit: Payer: Medicare HMO

## 2022-03-10 ENCOUNTER — Inpatient Hospital Stay (HOSPITAL_BASED_OUTPATIENT_CLINIC_OR_DEPARTMENT_OTHER): Payer: Medicare HMO | Admitting: Internal Medicine

## 2022-03-10 ENCOUNTER — Encounter: Payer: Self-pay | Admitting: Emergency Medicine

## 2022-03-10 ENCOUNTER — Ambulatory Visit: Payer: Medicare HMO | Admitting: Emergency Medicine

## 2022-03-10 VITALS — BP 106/58 | HR 86 | Temp 97.7°F | Ht 66.0 in | Wt 155.2 lb

## 2022-03-10 DIAGNOSIS — J9612 Chronic respiratory failure with hypercapnia: Secondary | ICD-10-CM

## 2022-03-10 DIAGNOSIS — C3491 Malignant neoplasm of unspecified part of right bronchus or lung: Secondary | ICD-10-CM

## 2022-03-10 DIAGNOSIS — J449 Chronic obstructive pulmonary disease, unspecified: Secondary | ICD-10-CM | POA: Diagnosis not present

## 2022-03-10 DIAGNOSIS — R9389 Abnormal findings on diagnostic imaging of other specified body structures: Secondary | ICD-10-CM | POA: Diagnosis not present

## 2022-03-10 DIAGNOSIS — J9611 Chronic respiratory failure with hypoxia: Secondary | ICD-10-CM | POA: Diagnosis not present

## 2022-03-10 DIAGNOSIS — R0602 Shortness of breath: Secondary | ICD-10-CM

## 2022-03-10 DIAGNOSIS — C3411 Malignant neoplasm of upper lobe, right bronchus or lung: Secondary | ICD-10-CM | POA: Diagnosis not present

## 2022-03-10 DIAGNOSIS — Z9221 Personal history of antineoplastic chemotherapy: Secondary | ICD-10-CM | POA: Diagnosis not present

## 2022-03-10 DIAGNOSIS — Z923 Personal history of irradiation: Secondary | ICD-10-CM | POA: Diagnosis not present

## 2022-03-10 MED ORDER — DOXYCYCLINE HYCLATE 100 MG PO TABS: 100.0000 mg | ORAL_TABLET | Freq: Two times a day (BID) | ORAL | 0 refills | Status: DC

## 2022-03-10 MED ORDER — PREDNISONE 10 MG PO TABS: ORAL_TABLET | ORAL | 0 refills | Status: DC

## 2022-03-10 NOTE — Assessment & Plan Note (Signed)
Continue current bronchodilator regimen 

## 2022-03-10 NOTE — Patient Instructions (Signed)
Please take prednisone 40 mg daily for the next 4 weeks.  At that time we will begin a taper: 30 mg daily for 10 days, 20 mg daily for 10 days, 10 mg daily for 10 days, then stop Please take doxycycline 100 mg twice a day for 7 days Please continue your Symbicort and Spiriva as you have been taking them We will perform walking oximetry today. We will plan to repeat your CT scan of the chest in 6 weeks Follow Dr. Lamonte Sakai next available after your CT chest so we can review the results together Continue to follow with oncology as planned

## 2022-03-10 NOTE — Assessment & Plan Note (Signed)
Walking oximetry today to ensure that she does not require supplemental oxygen with exertion.

## 2022-03-10 NOTE — Progress Notes (Signed)
[place] Telephone:([phone]   Fax:([phone]) [phone]  OFFICE PROGRESS NOTE  Joanne Smoker, MD [place] [address]  DIAGNOSIS: Unresectable stage IIb (T1c, No/N1, M0) non-small cell lung cancer, adenocarcinoma.  The patient presented with right upper lobe groundglass lesion and hypermetabolic right hilar lymph node.  She was diagnosed in September 2023.   PRIOR THERAPY: Concurrent chemoradiation with weekly carboplatin for AUC of 2 and paclitaxel 45 Mg/M2.  First dose October 27, 2021.  Status post 7 cycles.  Last dose was given in December 08, 2021  CURRENT THERAPY: Observation  INTERVAL HISTORY: Joanne Rogers 77 y.o. female returns to the clinic today for follow-up visit accompanied by her daughter.  The patient has been complaining of worsening dyspnea over the last 7-10 days.  She also has mild cough.  She has no chest pain or hemoptysis.  She has no nausea, vomiting, diarrhea or constipation.  She has no headache or visual changes.  She denied having any recent infection with COVID or influenza.  She had repeat CT scan of the chest performed recently and she is here for evaluation and discussion of her scan results.   MEDICAL HISTORY: Past Medical History:  Diagnosis Date   Arthritis    back, hands, neck   COPD (chronic obstructive pulmonary disease) ([place])    Coronary artery disease 11/28/2019   CCTA 11/28/19: Mild non-obstructive CAD, myocardial bridge mid LAD (cardiologist Dr. Standley Dakins, 08/19/21)   DDD (degenerative disc disease), lumbar    Dyspnea    uses oxygen at home   H/O bronchitis    allergy related   Hypercholesterolemia    Oxygen dependent    Palpitations    "fluttering"   Pneumonia    hx of    ALLERGIES:  is allergic to codeine, amoxicillin, latex, levofloxacin, and statins.  MEDICATIONS:  Current Outpatient Medications  Medication Sig Dispense Refill   acetaminophen (TYLENOL) 500 MG tablet Take  1,000 mg by mouth every 6 (six) hours as needed for moderate pain.     albuterol (VENTOLIN HFA) 108 (90 Base) MCG/ACT inhaler Inhale 1-2 puffs into the lungs every 4 (four) hours as needed for wheezing or shortness of breath. 1 each 5   ascorbic acid (VITAMIN C) 500 MG tablet Take 500 mg by mouth daily.     aspirin 81 MG tablet Take 81 mg by mouth at bedtime.     azelastine (ASTELIN) 0.1 % nasal spray Place 1 spray into both nostrils as needed for rhinitis.     beta carotene w/minerals (OCUVITE) tablet Take 1 tablet by mouth at bedtime.     bisoprolol (ZEBETA) 5 MG tablet Take 1 tablet (5 mg total) by mouth daily. 90 tablet 3   Budeson-Glycopyrrol-Formoterol (BREZTRI AEROSPHERE) 160-9-4.8 MCG/ACT AERO Inhale 2 puffs into the lungs in the morning and at bedtime. 1 each 3   cetirizine (ZYRTEC) 10 MG tablet Take 10 mg by mouth at bedtime.     cholecalciferol (VITAMIN D3) 25 MCG (1000 UNIT) tablet Take 1,000 Units by mouth daily.     Cyanocobalamin (B-12) 2500 MCG TABS Take 2,500 mcg by mouth daily.     doxycycline (VIBRA-TABS) 100 MG tablet Take 1 tablet (100 mg total) by mouth 2 (two) times daily. 14 tablet 0   famotidine (PEPCID) 20 MG tablet One at bedtime 30 tablet 11   ibuprofen (ADVIL) 200 MG tablet Take 400 mg by mouth every 6 (six) hours as needed  for moderate pain.     ipratropium-albuterol (DUONEB) 0.5-2.5 (3) MG/3ML SOLN Take 3 mLs by nebulization every 4 (four) hours. Dx: J44.9 360 mL 1   Multiple Minerals-Vitamins (CAL MAG ZINC +D3) TABS Take 1-2 tablets by mouth See admin instructions. Take 1 tablet in the morning and 2 tablets in the evening     OXYGEN 2lpm with sleep     Polyethyl Glycol-Propyl Glycol (SYSTANE OP) Place 1 drop into both eyes daily as needed (dry eyes).     predniSONE (DELTASONE) 10 MG tablet Take 6 tabs by mouth for 3 days, then 5 for 3 days, 4 for 3 days, 3 for 3 days, 2 for 3 days, 1 for 3 days and stop 63 tablet 0   prochlorperazine (COMPAZINE) 10 MG tablet Take 1  tablet (10 mg total) by mouth every 6 (six) hours as needed. 30 tablet 2   Respiratory Therapy Supplies (FLUTTER) DEVI 1 Device by Does not apply route as needed. 1 each 0   SYMBICORT 160-4.5 MCG/ACT inhaler Inhale 2 puffs into the lungs in the morning and at bedtime. 10.2 g 11   Tiotropium Bromide Monohydrate (SPIRIVA RESPIMAT) 2.5 MCG/ACT AERS INHALE 2 PUFFS INTO LUNGS ONCE DAILY 4 g 5   valACYclovir (VALTREX) 1000 MG tablet Take 500 mg by mouth every other day.      No current facility-administered medications for this visit.    SURGICAL HISTORY:  Past Surgical History:  Procedure Laterality Date   BRONCHIAL BIOPSY  09/29/2021   Procedure: BRONCHIAL BIOPSIES;  Surgeon: Collene Gobble, MD;  Location: [facility] ENDOSCOPY;  Service: Pulmonary;;   BRONCHIAL BRUSHINGS  09/29/2021   Procedure: BRONCHIAL BRUSHINGS;  Surgeon: Collene Gobble, MD;  Location: [facility] ENDOSCOPY;  Service: Pulmonary;;   BRONCHIAL NEEDLE ASPIRATION BIOPSY  09/29/2021   Procedure: BRONCHIAL NEEDLE ASPIRATION BIOPSIES;  Surgeon: Collene Gobble, MD;  Location: [facility] ENDOSCOPY;  Service: Pulmonary;;   CARDIOVASCULAR STRESS TEST  11/16/2005   EF 70%, NO ISCHEMIA   CARPAL TUNNEL RELEASE Right ~1988   DILATION AND CURETTAGE OF UTERUS     x3: x2 for miscarrages and x1 for polyp   HEMI-MICRODISCECTOMY LUMBAR LAMINECTOMY LEVEL 1 Left 07/20/2014   Procedure: centeral decompression,L4-L5, HEMI-LAMINECTOMY MICRODISCECTOMY L4 - L5 ON THE LEFT  LEVEL 1, forimatomy 4-5 root on the left;  Surgeon: Latanya Maudlin, MD;  Location: [facility] ORS;  Service: Orthopedics;  Laterality: Left;   US ECHOCARDIOGRAPHY  11/19/2005   EF 55-60%   VIDEO BRONCHOSCOPY WITH ENDOBRONCHIAL ULTRASOUND Right 09/29/2021   Procedure: VIDEO BRONCHOSCOPY WITH ENDOBRONCHIAL ULTRASOUND;  Surgeon: Collene Gobble, MD;  Location: [facility] ENDOSCOPY;  Service: Pulmonary;  Laterality: Right;   VIDEO BRONCHOSCOPY WITH RADIAL ENDOBRONCHIAL ULTRASOUND  09/29/2021   Procedure: VIDEO BRONCHOSCOPY WITH  RADIAL ENDOBRONCHIAL ULTRASOUND;  Surgeon: Collene Gobble, MD;  Location: [facility] ENDOSCOPY;  Service: Pulmonary;;    REVIEW OF SYSTEMS:  Constitutional: positive for fatigue Eyes: negative Ears, nose, mouth, throat, and face: negative Respiratory: positive for cough and dyspnea on exertion Cardiovascular: negative Gastrointestinal: negative Genitourinary:negative Integument/breast: negative Hematologic/lymphatic: negative Musculoskeletal:negative Neurological: negative Behavioral/Psych: negative Endocrine: negative Allergic/Immunologic: negative   PHYSICAL EXAMINATION: General appearance: alert, cooperative, fatigued, and no distress Head: Normocephalic, without obvious abnormality, atraumatic Neck: no adenopathy, no JVD, supple, symmetrical, trachea midline, and thyroid not enlarged, symmetric, no tenderness/mass/nodules Lymph nodes: Cervical, supraclavicular, and axillary nodes normal. Resp: clear to auscultation bilaterally Back: symmetric, no curvature. ROM normal. No CVA tenderness. Cardio: regular rate and rhythm, S1, S2 normal, no  murmur, click, rub or gallop GI: soft, non-tender; bowel sounds normal; no masses,  no organomegaly Extremities: extremities normal, atraumatic, no cyanosis or edema Neurologic: Alert and oriented X 3, normal strength and tone. Normal symmetric reflexes. Normal coordination and gait  ECOG PERFORMANCE STATUS: 1 - Symptomatic but completely ambulatory  There were no vitals taken for this visit.  LABORATORY DATA: Lab Results  Component Value Date   WBC 7.9 03/05/2022   HGB 10.8 (L) 03/05/2022   HCT 32.6 (L) 03/05/2022   MCV 101.9 (H) 03/05/2022   PLT 252 03/05/2022      Chemistry      Component Value Date/Time   NA 136 03/05/2022 1402   NA 137 05/08/2020 0909   K 3.8 03/05/2022 1402   CL 96 (L) 03/05/2022 1402   CO2 33 (H) 03/05/2022 1402   BUN 9 03/05/2022 1402   BUN 8 05/08/2020 0909   CREATININE 0.45 03/05/2022 1402      Component  Value Date/Time   CALCIUM 9.8 03/05/2022 1402   ALKPHOS 84 03/05/2022 1402   AST 16 03/05/2022 1402   ALT 14 03/05/2022 1402   BILITOT 0.4 03/05/2022 1402       RADIOGRAPHIC STUDIES: CT Chest W Contrast  Result Date: 03/06/2022 CLINICAL DATA:  Staging non-small-cell lung cancer. Adenocarcinoma of the right lung. Stage II. * Tracking Code: BO * EXAM: CT CHEST WITH CONTRAST TECHNIQUE: Multidetector CT imaging of the chest was performed during intravenous contrast administration. RADIATION DOSE REDUCTION: This exam was performed according to the departmental dose-optimization program which includes automated exposure control, adjustment of the mA and/or kV according to patient size and/or use of iterative reconstruction technique. CONTRAST:  12m OMNIPAQUE IOHEXOL 300 MG/ML  SOLN COMPARISON:  CT chest 01/01/2022 and older.  PET-CT scan 09/17/2021 FINDINGS: Cardiovascular: Diffuse vascular calcifications are identified including along the thoracic aorta, origin of the great vessels and along the coronary arteries. No pericardial effusion. The heart is nonenlarged. There is some enlargement of the central pulmonary arteries. Please correlate for evidence of pulmonary artery hypertension. Mediastinum/Nodes: No specific abnormal lymph node enlargement identified in the axillary region or hila. Exception is the right hilum where there was previously a 2.1 x 1.0 cm lymph node which today on series 2, image 70 would measure 17 x 10 mm. Slightly smaller. There are several small less than 1 cm size in short axis mediastinal nodes, not pathologic by size criteria. Overall these are not significantly changed from prior when adjusted for technique. Small hiatal hernia. Normal caliber thoracic esophagus. Heterogeneous thyroid gland. Slight enlargement of the posterior aspect of the right thyroid lobe. Lungs/Pleura: The left lung demonstrates some breathing motion. No consolidation, pneumothorax or effusion. Mild  lingular scar and atelectasis. On the right, previously there is a semi-solid nodule right perihilar measuring 20 x 15 mm. This area is again seen measuring on image cc 8 of series 5 nineteen by 16 mm but is more confluence and density opacified. In addition there is a much larger adjacent area of consolidation of the lung with associated interstitial septal thickening and components of ground-glass. This area on image 66 would have dimensions approaching 6.2 by 2.2 cm. The ground-glass areas extend anterior superior from this with some bandlike components. Few patchy areas in the posterior right upper lobe. There are also moderate areas in the superior segment of the right lower lobe which are new. No right-sided pneumothorax or effusion. Clear central airways. Underlying emphysematous lung changes. Upper Abdomen: The adrenal glands  are preserved in the upper abdomen. Fatty liver infiltration. Left hepatic lobe cystic areas. Prominent vascular calcifications in the visualized abdomen Musculoskeletal: Mild degenerative changes along the spine. IMPRESSION: Small right-sided semi-solid perihilar upper lobe nodular area today is similar in size but more confluence opacity and is associated with an adjacent much larger area of consolidative opacity with air bronchograms. There are also developing areas of patchy ground-glass adjacent to this area in the right upper lobe as well as in the superior segment of the right lower lobe. This would have a differential. In principle, neoplasm is possible however this also could be an infectious or inflammatory etiology. Please correlate with clinical presentation. Short follow-up or additional workup when appropriate. Slight decrease in size of the mildly enlarged right hilar lymph node. No new lymph node enlargement identified in the thorax. No pleural effusion. Fatty liver infiltration. Aortic Atherosclerosis (ICD10-I70.0) and Emphysema (ICD10-J43.9). Findings will be discussed  with the ordering service by the Radiology physician assistant team Electronically Signed   By: Jill Side M.D.   On: 03/06/2022 18:35    ASSESSMENT AND PLAN: This is a very pleasant 77 years old white female with Unresectable stage IIb (T1c, No/N1, M0) non-small cell lung cancer, adenocarcinoma.  The patient presented with right upper lobe groundglass lesion and hypermetabolic right hilar lymph node.  She was diagnosed in September 2023. The patient underwent a course of concurrent chemoradiation with weekly carboplatin for AUC of 2 and paclitaxel 45 Mg/M2 status post 7 cycle. The patient is currently on observation and she is feeling fine except for the shortness of breath and cough that started in the last 2 weeks. She had repeat CT scan of the chest performed recently.  I personally and independently reviewed the scan images and discussed results with the patient and her daughter and showed them the images today.  Unfortunately her scan showed more confidence opacity and associated with an adjacent much larger area of consolidative opacity with air bronchograms.  There was also developing areas of patchy groundglass adjacent to this area in the right upper lobe as well as the superior segment of the right lower lobe this is highly concerning for infectious or inflammatory etiology or secondary to the most recent radiotherapy. There was a slight decrease in the size of the mildly enlarged right hilar lymph node. I had a lengthy discussion with the patient and her daughter today about her condition.  I think these findings are more concerning to be inflammation or radiation-induced pneumonitis. The patient was seen earlier today by Dr. Lamonte Sakai and he recommended for her treatment with doxycycline 10 mg p.o. daily for 7 days.  I will extend her treatment for 7 more days.  She also started her on a tapered dose of Decadron for the next 6 weeks.  I agreed with his plan.  I also advised the patient to take  Prilosec OTC 20 mg p.o. daily for GI prophylaxis while she is on treatment with steroids. She will have repeat CT scan of the chest in 6 weeks for reevaluation of her condition and to rule out any disease recurrence or progression. I will arrange for the patient a follow-up appointment with me few days after the scan for further recommendation regarding her condition. She was advised to call immediately if she has any other concerning symptoms in the interval. She was advised to call immediately if she has any concerning symptoms in the interval. The patient voices understanding of current disease status and treatment  options and is in agreement with the current care plan.  All questions were answered. The patient knows to call the clinic with any problems, questions or concerns. We can certainly see the patient much sooner if necessary.  The total time spent in the appointment was 30 minutes.  Disclaimer: This note was dictated with voice recognition software. Similar sounding words can inadvertently be transcribed and may not be corrected upon review.

## 2022-03-10 NOTE — Progress Notes (Signed)
Subjective:    Patient ID: Joanne Rogers, female    DOB: 1945-01-25, 77 y.o.   MRN: [id number]  HPI   [id number] --Joanne Rogers is 70 with a history severe COPD, severe obstruction on pulmonary function testing.  Currently managed on Spiriva and Symbicort.  She also has a right upper lobe groundglass opacity recently diagnosed by navigational bronchoscopy and is in no carcinoma.  She has seen oncology and radiation oncology. Today she reports that she believes that her breathing and functional capacity are a bit worse since her bronchoscopy. She has occasional chest discomfort w exertion. Some occasional wheeze, rare cough. She has a a lot of allergy and nasal gtt right now. On zyrtec, astelin - not very effective. No nasal steroid.   MRI brain 10/14/2021 without any evidence of intracranial metastatic disease  ROV 03/10/22 --follow-up visit 77 year old woman with severe COPD, right upper lobe adenocarcinoma diagnosed by navigational bronchoscopy being treated with concurrent chemoradiation., chronic rhinitis.  She was tried on Breztri - could not tolerate so on spiriva and symbicort.  She uses oxygen at 2 L/min with sleep.  Has albuterol and uses approximately 0-3 x a day.  She has been having increased exertional SOB for 4-5 days. No other associated sx. Very little cough. She does have some increased chest mucous today.   CT chest 03/05/22 reviewed by me shows no significant change in the small right upper lobe nodule although it is now more solid.  There is a confluent adjacent opacity with air bronchograms and patchy groundglass, new compared with prior done in 12/2021.  Review of Systems As per HPI     Objective:   Physical Exam Vitals:   03/10/22 1346  BP: (!) 106/58  Pulse: 86  Temp: 97.7 F (36.5 C)  TempSrc: Oral  SpO2: 94%  Weight: 155 lb 3.2 oz (70.4 kg)  Height: '5\' 6"'$  (1.676 m)   Gen: Pleasant, well-nourished, in no distress,  normal affect  ENT: No lesions,  mouth clear,   oropharynx clear, no postnasal drip  Neck: No JVD, no stridor  Lungs: No use of accessory muscles, no crackles or wheezing on normal respiration, no wheeze on forced expiration  Cardiovascular: RRR, heart sounds normal, no murmur or gallops, no peripheral edem  Musculoskeletal: No deformities, no cyanosis or clubbing  Neuro: alert, awake, non focal  Skin: Warm, no lesions or rash      Assessment & Plan:  Abnormal CT of the chest New focal infiltrate adjacent to the known right upper lobe adenocarcinoma.  Clinical history is not consistent with a bacterial pneumonia, timing and scenario very suspicious for radiation pneumonitis.  Discussed the differential diagnosis with her.  I think we should treat her empirically with doxycycline in case there is an infectious component but I will also start her on treatment dose prednisone with a taper.  Plan to repeat her CT scan of the chest in about 6 weeks to look for resolution.  Please take prednisone 40 mg daily for the next 4 weeks.  At that time we will begin a taper: 30 mg daily for 10 days, 20 mg daily for 10 days, 10 mg daily for 10 days, then stop Please take doxycycline 100 mg twice a day for 7 days Please continue your Symbicort and Spiriva as you have been taking them We will perform walking oximetry today. We will plan to repeat your CT scan of the chest in 6 weeks Follow Dr. Lamonte Sakai next available after your CT chest  so we can review the results together Continue to follow with oncology as planned  COPD GOLD III Continue current bronchodilator regimen  Chronic respiratory failure with hypoxia and hypercapnia (HCC) Walking oximetry today to ensure that she does not require supplemental oxygen with exertion.  Baltazar Apo, MD, PhD 03/10/2022, 2:48 PM [facility] [phone] or if no answer [phone]

## 2022-03-10 NOTE — Addendum Note (Signed)
Addended by: Loma Sousa on: 03/10/2022 04:26 PM   Modules accepted: Orders

## 2022-03-10 NOTE — Assessment & Plan Note (Signed)
New focal infiltrate adjacent to the known right upper lobe adenocarcinoma.  Clinical history is not consistent with a bacterial pneumonia, timing and scenario very suspicious for radiation pneumonitis.  Discussed the differential diagnosis with her.  I think we should treat her empirically with doxycycline in case there is an infectious component but I will also start her on treatment dose prednisone with a taper.  Plan to repeat her CT scan of the chest in about 6 weeks to look for resolution.  Please take prednisone 40 mg daily for the next 4 weeks.  At that time we will begin a taper: 30 mg daily for 10 days, 20 mg daily for 10 days, 10 mg daily for 10 days, then stop Please take doxycycline 100 mg twice a day for 7 days Please continue your Symbicort and Spiriva as you have been taking them We will perform walking oximetry today. We will plan to repeat your CT scan of the chest in 6 weeks Follow Dr. Lamonte Sakai next available after your CT chest so we can review the results together Continue to follow with oncology as planned

## 2022-03-12 ENCOUNTER — Telehealth: Payer: Self-pay | Admitting: Emergency Medicine

## 2022-03-12 NOTE — Telephone Encounter (Signed)
Patient is calling to check the order for her portable oxygen.  She stated that the doctor was going to put in the order at her last visit on 2/27.  Please call patient to give her an update.   CB# [phone]

## 2022-03-12 NOTE — Telephone Encounter (Signed)
Called and spoke w/ pt, I informed her that Mardene Celeste from [place] did send [place] a confirmation of receiving the order for a POC and that their office will be in tough with her. She verbalized understanding. NFN att.

## 2022-03-16 ENCOUNTER — Telehealth: Payer: Self-pay | Admitting: Emergency Medicine

## 2022-03-16 NOTE — Telephone Encounter (Signed)
Pt called in bc she was sent a second Doxycline and want to know if she should be taking another round of this medicine.

## 2022-03-17 ENCOUNTER — Telehealth: Payer: Self-pay | Admitting: Emergency Medicine

## 2022-03-17 NOTE — Telephone Encounter (Signed)
Called and spoke with patient. Patient stated that she wanted to know what to do with the second bottle of doxycycline. She stated that she got sent another prescription. Patient wants to know if she should take that one too or if it was just a duplicate order. Patient also stated that she still isn't feeling well.   RB, please advise.

## 2022-03-17 NOTE — Telephone Encounter (Signed)
Please see last signed encounter. PT did NOT have a refill she was calling to see if she could GET a refill since she is not feeling better. Pls call PT to advise @ [phone]  A tad better but still feels like she needs more.    [facility]

## 2022-03-17 NOTE — Telephone Encounter (Signed)
Called and spoke with patient. She verbalized understanding.   Nothing further needed.  

## 2022-03-17 NOTE — Telephone Encounter (Signed)
Not sure why a refill was on that prescription, intended for her to only take x 7 days. If she has already picked up, cannot return it, then she can keep it on reserve in event that we decide together in the future that she needs to be treated again.

## 2022-03-17 NOTE — Telephone Encounter (Signed)
Daughter Hope calling. PT does not have enough Pred for the 6 week course Dr. Lamonte Sakai recommended. Please call her @ [phone]  [facility] on [address]    Setting High priority as Pred has specific inst.

## 2022-03-18 MED ORDER — PREDNISONE 10 MG PO TABS: ORAL_TABLET | ORAL | 0 refills | Status: DC

## 2022-03-18 MED ORDER — PREDNISONE 10 MG PO TABS: 40.0000 mg | ORAL_TABLET | Freq: Every day | ORAL | 0 refills | Status: DC

## 2022-03-18 NOTE — Telephone Encounter (Signed)
Prescriptions have been sent to patients pharmacy.   Nothing further needed.

## 2022-03-18 NOTE — Telephone Encounter (Signed)
Called and spoke with [facility]. Hope stated that patient did not have enough prednisone for the 6 weeks Dr. Lamonte Sakai wanted her to do. Hope wanted me to send in the remaining amount of prednisone to the patients pharmacy. Hope stated that they only gave her enough for 18 days.   Dr. Lamonte Sakai are ok with me sending in the remainder amount of prednisone?

## 2022-03-18 NOTE — Telephone Encounter (Signed)
Called and spoke with patient. Patient was confused about her doxycycline. Explained to her what Dr. Lamonte Sakai said yesterday about the doxycycline.   Nothing further needed.

## 2022-03-18 NOTE — Telephone Encounter (Signed)
Yes please

## 2022-03-20 ENCOUNTER — Telehealth: Payer: Self-pay | Admitting: Emergency Medicine

## 2022-03-20 NOTE — Telephone Encounter (Signed)
Just to clarify:  Intended for her to start 03/10/22 >> Please take prednisone 40 mg daily for the next 4 weeks. At that time we will begin a taper: 30 mg daily for 10 days, 20 mg daily for 10 days, 10 mg daily for 10 days, then stop

## 2022-03-20 NOTE — Telephone Encounter (Signed)
[facility] needs a verbal. Two RX...two [place] @ [phone]   [phone]

## 2022-03-20 NOTE — Telephone Encounter (Signed)
Called and spoke with patients daughter. Advised patient I would call the pharmacy and let them know to fill both.   Spoke with pharmacy tech and told her that both of the medications were needing to be filled. She verbalized understanding.   Nothing further needed.

## 2022-03-20 NOTE — Telephone Encounter (Signed)
Dr. Lamonte Sakai, please advise on pt's prednisone exactly what the instructions were supposed to have been for the medication.  Multiple different prescriptions have been sent to the pharmacy for pt's prednisone and the pharmacy is calling to get the accurate instructions.

## 2022-03-24 ENCOUNTER — Telehealth: Payer: Self-pay | Admitting: Emergency Medicine

## 2022-03-24 DIAGNOSIS — J449 Chronic obstructive pulmonary disease, unspecified: Secondary | ICD-10-CM

## 2022-03-24 NOTE — Telephone Encounter (Signed)
Hope states [place] states patient needs evaluation. Patient had evaluation in office visit for POC. Hope phone number is [phone].

## 2022-03-24 NOTE — Telephone Encounter (Signed)
Hope checking on message for POC. Hope phone number is [phone].

## 2022-03-25 DIAGNOSIS — Z01419 Encounter for gynecological examination (general) (routine) without abnormal findings: Secondary | ICD-10-CM | POA: Diagnosis not present

## 2022-03-25 DIAGNOSIS — Z780 Asymptomatic menopausal state: Secondary | ICD-10-CM | POA: Diagnosis not present

## 2022-03-25 DIAGNOSIS — Z1231 Encounter for screening mammogram for malignant neoplasm of breast: Secondary | ICD-10-CM | POA: Diagnosis not present

## 2022-03-25 NOTE — Telephone Encounter (Signed)
Called and spoke to the pt. Updated her about her POC. The data that qualified her for a POC was entered in incorrectly, which did not qualify her for the POC. As of today 03/25/22 that order has been fixed and reordered. Pt verbalized understanding. Sending to [facility] as a  FYI.

## 2022-03-28 ENCOUNTER — Encounter: Payer: Self-pay | Admitting: Internal Medicine

## 2022-04-01 ENCOUNTER — Encounter: Payer: Self-pay | Admitting: Medical Oncology

## 2022-04-09 ENCOUNTER — Telehealth: Payer: Self-pay | Admitting: Emergency Medicine

## 2022-04-09 MED ORDER — PREDNISONE 10 MG PO TABS: ORAL_TABLET | ORAL | 0 refills | Status: AC

## 2022-04-09 NOTE — Telephone Encounter (Signed)
Called and spoke with patient's daughter. She stated that the prednisone RX that sent in for the patient was not correct. She only received enough prednisone to complete the 1st week. I reviewed her chart and noticed that only 20 tablets were sent. I sent in enough to cover the remaining dosage. She verbalized understanding.   Nothing further needed at time of call.

## 2022-04-21 ENCOUNTER — Ambulatory Visit (HOSPITAL_COMMUNITY)
Admission: RE | Admit: 2022-04-21 | Discharge: 2022-04-21 | Disposition: A | Discharge status: Home / Self Care | Payer: Medicare HMO | Source: Ambulatory Visit | Attending: Emergency Medicine | Admitting: Emergency Medicine

## 2022-04-21 DIAGNOSIS — R0602 Shortness of breath: Secondary | ICD-10-CM

## 2022-04-21 DIAGNOSIS — R911 Solitary pulmonary nodule: Secondary | ICD-10-CM | POA: Diagnosis not present

## 2022-04-21 DIAGNOSIS — C3490 Malignant neoplasm of unspecified part of unspecified bronchus or lung: Secondary | ICD-10-CM | POA: Diagnosis not present

## 2022-04-23 ENCOUNTER — Inpatient Hospital Stay (HOSPITAL_BASED_OUTPATIENT_CLINIC_OR_DEPARTMENT_OTHER): Payer: Medicare HMO | Admitting: Internal Medicine

## 2022-04-23 ENCOUNTER — Inpatient Hospital Stay: Payer: Medicare HMO | Attending: Physician Assistant

## 2022-04-23 VITALS — BP 115/72 | HR 82 | Temp 98.1°F | Resp 18 | Wt 159.3 lb

## 2022-04-23 DIAGNOSIS — Z9221 Personal history of antineoplastic chemotherapy: Secondary | ICD-10-CM | POA: Diagnosis not present

## 2022-04-23 DIAGNOSIS — Z79624 Long term (current) use of inhibitors of nucleotide synthesis: Secondary | ICD-10-CM | POA: Insufficient documentation

## 2022-04-23 DIAGNOSIS — Z7982 Long term (current) use of aspirin: Secondary | ICD-10-CM | POA: Insufficient documentation

## 2022-04-23 DIAGNOSIS — C3411 Malignant neoplasm of upper lobe, right bronchus or lung: Secondary | ICD-10-CM | POA: Diagnosis not present

## 2022-04-23 DIAGNOSIS — Z923 Personal history of irradiation: Secondary | ICD-10-CM | POA: Insufficient documentation

## 2022-04-23 DIAGNOSIS — Z7952 Long term (current) use of systemic steroids: Secondary | ICD-10-CM | POA: Diagnosis not present

## 2022-04-23 DIAGNOSIS — Z79899 Other long term (current) drug therapy: Secondary | ICD-10-CM | POA: Diagnosis not present

## 2022-04-23 DIAGNOSIS — C3490 Malignant neoplasm of unspecified part of unspecified bronchus or lung: Secondary | ICD-10-CM | POA: Diagnosis not present

## 2022-04-23 DIAGNOSIS — C3491 Malignant neoplasm of unspecified part of right bronchus or lung: Secondary | ICD-10-CM

## 2022-04-23 DIAGNOSIS — Z9981 Dependence on supplemental oxygen: Secondary | ICD-10-CM | POA: Insufficient documentation

## 2022-04-23 LAB — CMP (CANCER CENTER ONLY)
ALT: 28 U/L (ref 0–44)
AST: 18 U/L (ref 15–41)
Albumin: 3.6 g/dL (ref 3.5–5.0)
Alkaline Phosphatase: 71 U/L (ref 38–126)
Anion gap: 5 (ref 5–15)
BUN: 18 mg/dL (ref 8–23)
CO2: 33 mmol/L — ABNORMAL HIGH (ref 22–32)
Calcium: 9.8 mg/dL (ref 8.9–10.3)
Chloride: 97 mmol/L — ABNORMAL LOW (ref 98–111)
Creatinine: 0.64 mg/dL (ref 0.44–1.00)
GFR, Estimated: >60 mL/min (ref >60)
Glucose, Bld: 183 mg/dL — ABNORMAL HIGH (ref 70–99)
Potassium: 4.6 mmol/L (ref 3.5–5.1)
Sodium: 135 mmol/L (ref 135–145)
Total Bilirubin: 0.3 mg/dL (ref 0.3–1.2)
Total Protein: 6.6 g/dL (ref 6.5–8.1)

## 2022-04-23 LAB — CBC WITH DIFFERENTIAL (CANCER CENTER ONLY)
Abs Immature Granulocytes: 0.04 10*3/uL (ref 0.00–0.07)
Basophils Absolute: 0 10*3/uL (ref 0.0–0.1)
Basophils Relative: 0 %
Eosinophils Absolute: 0 10*3/uL (ref 0.0–0.5)
Eosinophils Relative: 0 %
HCT: 32.5 % — ABNORMAL LOW (ref 36.0–46.0)
Hemoglobin: 10.4 g/dL — ABNORMAL LOW (ref 12.0–15.0)
Immature Granulocytes: 0 %
Lymphocytes Relative: 4 %
Lymphs Abs: 0.4 10*3/uL — ABNORMAL LOW (ref 0.7–4.0)
MCH: 31.8 pg (ref 26.0–34.0)
MCHC: 32 g/dL (ref 30.0–36.0)
MCV: 99.4 fL (ref 80.0–100.0)
Monocytes Absolute: 0.3 10*3/uL (ref 0.1–1.0)
Monocytes Relative: 3 %
Neutro Abs: 8.9 10*3/uL — ABNORMAL HIGH (ref 1.7–7.7)
Neutrophils Relative %: 93 %
Platelet Count: 212 10*3/uL (ref 150–400)
RBC: 3.27 MIL/uL — ABNORMAL LOW (ref 3.87–5.11)
RDW: 13.2 % (ref 11.5–15.5)
WBC Count: 9.7 10*3/uL (ref 4.0–10.5)
nRBC: 0 % (ref 0.0–0.2)

## 2022-04-23 NOTE — Progress Notes (Signed)
[facility] [phone]   [phone]  OFFICE PROGRESS NOTE  Shon Hale, MD [address] [place] [address]  DIAGNOSIS: Unresectable stage IIb (T1c, No/N1, M0) non-small cell lung cancer, adenocarcinoma.  The patient presented with right upper lobe groundglass lesion and hypermetabolic right hilar lymph node.  She was diagnosed in September 2023.   PRIOR THERAPY: Concurrent chemoradiation with weekly carboplatin for AUC of 2 and paclitaxel 45 Mg/M2.  First dose October 27, 2021.  Status post 7 cycles.  Last dose was given in December 08, 2021  CURRENT THERAPY: Observation  INTERVAL HISTORY: Jerilee Goldstone Vanderford 77 y.o. female returns to the clinic today for follow-up visit accompanied by her daughter.  The patient continues to have the baseline shortness of breath increased with exertion and she is currently on home oxygen.  She has been treated with a taper course of prednisone and she will finish this week.  She denied having any current chest pain but has cough with no hemoptysis.  She has no nausea, vomiting, diarrhea or constipation.  She has no headache or visual changes.  She denied having any significant weight loss or night sweats.  She is here today for evaluation with repeat CT scan of the chest for restaging of her disease.   MEDICAL HISTORY: Past Medical History:  Diagnosis Date   Arthritis    back, hands, neck   COPD (chronic obstructive pulmonary disease) (HCC)    Coronary artery disease 11/28/2019   CCTA 11/28/19: Mild non-obstructive CAD, myocardial bridge mid LAD (cardiologist Dr. Georgeanna Lea, 08/19/21)   DDD (degenerative disc disease), lumbar    Dyspnea    uses oxygen at home   H/O bronchitis    allergy related   Hypercholesterolemia    Oxygen dependent    Palpitations    "fluttering"   Pneumonia    hx of    ALLERGIES:  is allergic to codeine, amoxicillin, latex, levofloxacin, and  statins.  MEDICATIONS:  Current Outpatient Medications  Medication Sig Dispense Refill   acetaminophen (TYLENOL) 500 MG tablet Take 1,000 mg by mouth every 6 (six) hours as needed for moderate pain.     albuterol (VENTOLIN HFA) 108 (90 Base) MCG/ACT inhaler Inhale 1-2 puffs into the lungs every 4 (four) hours as needed for wheezing or shortness of breath. 1 each 5   ascorbic acid (VITAMIN C) 500 MG tablet Take 500 mg by mouth daily.     aspirin 81 MG tablet Take 81 mg by mouth at bedtime.     azelastine (ASTELIN) 0.1 % nasal spray Place 1 spray into both nostrils as needed for rhinitis.     beta carotene w/minerals (OCUVITE) tablet Take 1 tablet by mouth at bedtime.     bisoprolol (ZEBETA) 5 MG tablet Take 1 tablet (5 mg total) by mouth daily. 90 tablet 3   Budeson-Glycopyrrol-Formoterol (BREZTRI AEROSPHERE) 160-9-4.8 MCG/ACT AERO Inhale 2 puffs into the lungs in the morning and at bedtime. 1 each 3   cetirizine (ZYRTEC) 10 MG tablet Take 10 mg by mouth at bedtime.     cholecalciferol (VITAMIN D3) 25 MCG (1000 UNIT) tablet Take 1,000 Units by mouth daily.     Cyanocobalamin (B-12) 2500 MCG TABS Take 2,500 mcg by mouth daily.     doxycycline (VIBRA-TABS) 100 MG tablet Take 1 tablet (100 mg total) by mouth 2 (two) times daily. 28 tablet 0   famotidine (PEPCID) 20 MG tablet One at bedtime 30  tablet 11   ibuprofen (ADVIL) 200 MG tablet Take 400 mg by mouth every 6 (six) hours as needed for moderate pain.     ipratropium-albuterol (DUONEB) 0.5-2.5 (3) MG/3ML SOLN Take 3 mLs by nebulization every 4 (four) hours. Dx: J44.9 360 mL 1   Multiple Minerals-Vitamins (CAL MAG ZINC +D3) TABS Take 1-2 tablets by mouth See admin instructions. Take 1 tablet in the morning and 2 tablets in the evening     OXYGEN 2lpm with sleep     Polyethyl Glycol-Propyl Glycol (SYSTANE OP) Place 1 drop into both eyes daily as needed (dry eyes).     predniSONE (DELTASONE) 10 MG tablet Take 6 tabs by mouth for 3 days, then 5 for  3 days, 4 for 3 days, 3 for 3 days, 2 for 3 days, 1 for 3 days and stop 63 tablet 0   predniSONE (DELTASONE) 10 MG tablet Take 4 tablets (40 mg total) by mouth daily with breakfast. 56 tablet 0   predniSONE (DELTASONE) 10 MG tablet Take 3 tablets (30 mg total) by mouth daily for 4 days, THEN 2 tablets (20 mg total) daily for 10 days, THEN 1 tablet (10 mg total) daily for 10 days. 42 tablet 0   prochlorperazine (COMPAZINE) 10 MG tablet Take 1 tablet (10 mg total) by mouth every 6 (six) hours as needed. 30 tablet 2   Respiratory Therapy Supplies (FLUTTER) DEVI 1 Device by Does not apply route as needed. 1 each 0   SYMBICORT 160-4.5 MCG/ACT inhaler Inhale 2 puffs into the lungs in the morning and at bedtime. 10.2 g 11   Tiotropium Bromide Monohydrate (SPIRIVA RESPIMAT) 2.5 MCG/ACT AERS INHALE 2 PUFFS INTO LUNGS ONCE DAILY 4 g 5   valACYclovir (VALTREX) 1000 MG tablet Take 500 mg by mouth every other day.      No current facility-administered medications for this visit.    SURGICAL HISTORY:  Past Surgical History:  Procedure Laterality Date   BRONCHIAL BIOPSY  09/29/2021   Procedure: BRONCHIAL BIOPSIES;  Surgeon: Leslye Peer, MD;  Location[facility] ENDOSCOPY;  Service: Pulmonary;;   BRONCHIAL BRUSHINGS  09/29/2021   Procedure: BRONCHIAL BRUSHINGS;  Surgeon: Leslye Peer, MD;  Location: [facility] ENDOSCOPY;  Service: Pulmonary;;   BRONCHIAL NEEDLE ASPIRATION BIOPSY  09/29/2021   Procedure: BRONCHIAL NEEDLE ASPIRATION BIOPSIES;  Surgeon: Leslye Peer, MD;  Location: [facility] ENDOSCOPY;  Service: Pulmonary;;   CARDIOVASCULAR STRESS TEST  11/16/2005   EF 70%, NO ISCHEMIA   CARPAL TUNNEL RELEASE Right ~1988   DILATION AND CURETTAGE OF UTERUS     x3: x2 for miscarrages and x1 for polyp   HEMI-MICRODISCECTOMY LUMBAR LAMINECTOMY LEVEL 1 Left 07/20/2014   Procedure: centeral decompression,L4-L5, HEMI-LAMINECTOMY MICRODISCECTOMY L4 - L5 ON THE LEFT  LEVEL 1, forimatomy 4-5 root on the left;  Surgeon: Ranee Gosselin,  MD;  Location: [facility] ORS;  Service: Orthopedics;  Laterality: Left;   US ECHOCARDIOGRAPHY  11/19/2005   EF 55-60%   VIDEO BRONCHOSCOPY WITH ENDOBRONCHIAL ULTRASOUND Right 09/29/2021   Procedure: VIDEO BRONCHOSCOPY WITH ENDOBRONCHIAL ULTRASOUND;  Surgeon: Leslye Peer, MD;  Location: [facility] ENDOSCOPY;  Service: Pulmonary;  Laterality: Right;   VIDEO BRONCHOSCOPY WITH RADIAL ENDOBRONCHIAL ULTRASOUND  09/29/2021   Procedure: VIDEO BRONCHOSCOPY WITH RADIAL ENDOBRONCHIAL ULTRASOUND;  Surgeon: Leslye Peer, MD;  Location: [facility] ENDOSCOPY;  Service: Pulmonary;;    REVIEW OF SYSTEMS:  Constitutional: positive for fatigue Eyes: negative Ears, nose, mouth, throat, and face: negative Respiratory: positive for cough and dyspnea on exertion  Cardiovascular: negative Gastrointestinal: negative Genitourinary:negative Integument/breast: negative Hematologic/lymphatic: negative Musculoskeletal:negative Neurological: negative Behavioral/Psych: negative Endocrine: negative Allergic/Immunologic: negative   PHYSICAL EXAMINATION: General appearance: alert, cooperative, fatigued, and no distress Head: Normocephalic, without obvious abnormality, atraumatic Neck: no adenopathy, no JVD, supple, symmetrical, trachea midline, and thyroid not enlarged, symmetric, no tenderness/mass/nodules Lymph nodes: Cervical, supraclavicular, and axillary nodes normal. Resp: clear to auscultation bilaterally Back: symmetric, no curvature. ROM normal. No CVA tenderness. Cardio: regular rate and rhythm, S1, S2 normal, no murmur, click, rub or gallop GI: soft, non-tender; bowel sounds normal; no masses,  no organomegaly Extremities: extremities normal, atraumatic, no cyanosis or edema Neurologic: Alert and oriented X 3, normal strength and tone. Normal symmetric reflexes. Normal coordination and gait  ECOG PERFORMANCE STATUS: 1 - Symptomatic but completely ambulatory  Blood pressure 115/72, pulse 82, temperature 98.1 F (36.7 C),  temperature source Oral, resp. rate 18, weight 159 lb 4.8 oz (72.3 kg), SpO2 97 %.  LABORATORY DATA: Lab Results  Component Value Date   WBC 9.7 04/23/2022   HGB 10.4 (L) 04/23/2022   HCT 32.5 (L) 04/23/2022   MCV 99.4 04/23/2022   PLT 212 04/23/2022      Chemistry      Component Value Date/Time   NA 135 04/23/2022 1500   NA 137 05/08/2020 0909   K 4.6 04/23/2022 1500   CL 97 (L) 04/23/2022 1500   CO2 33 (H) 04/23/2022 1500   BUN 18 04/23/2022 1500   BUN 8 05/08/2020 0909   CREATININE 0.64 04/23/2022 1500      Component Value Date/Time   CALCIUM 9.8 04/23/2022 1500   ALKPHOS 71 04/23/2022 1500   AST 18 04/23/2022 1500   ALT 28 04/23/2022 1500   BILITOT 0.3 04/23/2022 1500       RADIOGRAPHIC STUDIES: CT Chest Wo Contrast  Result Date: 04/22/2022 CLINICAL DATA:  Non-small cell lung cancer; * Tracking Code: BO * EXAM: CT CHEST WITHOUT CONTRAST TECHNIQUE: Multidetector CT imaging of the chest was performed following the standard protocol without IV contrast. RADIATION DOSE REDUCTION: This exam was performed according to the departmental dose-optimization program which includes automated exposure control, adjustment of the mA and/or kV according to patient size and/or use of iterative reconstruction technique. COMPARISON:  Multiple priors, most recent chest CT dated March 05, 2022 FINDINGS: Cardiovascular: Normal heart size. No pericardial effusion. Normal caliber thoracic aorta with severe calcified plaque. Severe coronary artery calcifications. Mediastinum/Nodes: Esophagus thyroid are unremarkable. No enlarged lymph nodes seen in the chest. Lungs/Pleura: Central airways are patent. Subsolid nodule of the right upper lobe is decreased in size when compared with the prior exam, but demonstrates increased density when compared with more remote priors, measuring 1.6 x 1.0 cm on series 5, image 58, previously 1.9 x 1.6 cm. Adjacent opacities of the right upper lobe and superior right  lower lobe appear more confluent and have a more linear configuration, consistent with evolving postradiation change. Stable ground-glass nodule of the right lower lobe measuring 8 mm on series 5, image 84. Stable ground-glass nodule of the right middle lobe measuring 10 mm on series 5, image 77. No pleural effusion. Upper Abdomen: No acute abnormality. Musculoskeletal: No chest wall mass or suspicious bone lesions identified. IMPRESSION: 1. Subsolid nodule of the right upper lobe is decreased in size when compared with the prior exam, but demonstrates increased density when compared with more remote priors, likely posttreatment change. Evolving postradiation change of the right upper lobe and superior portion of the right lower lobe.  2. No evidence of metastatic disease in the chest. 3. Stable ground-glass nodules of the right lower lobe and right middle lobe. Recommend attention on follow-up. 4. Aortic Atherosclerosis (ICD10-I70.0). Electronically Signed   By: Allegra LaiLeah  Strickland M.D.   On: 04/22/2022 20:56    ASSESSMENT AND PLAN: This is a very pleasant 77 years old white female with Unresectable stage IIb (T1c, No/N1, M0) non-small cell lung cancer, adenocarcinoma.  The patient presented with right upper lobe groundglass lesion and hypermetabolic right hilar lymph node.  She was diagnosed in September 2023. The patient underwent a course of concurrent chemoradiation with weekly carboplatin for AUC of 2 and paclitaxel 45 Mg/M2 status post 7 cycle. She has a rough time tolerating the previous treatment with increasing fatigue and weakness. She is currently on observation and she recently completed a course of taper prednisone because of significant radiation-induced pneumonitis.  She is feeling a little bit better but is still requiring home oxygen because of the shortness of breath. The patient had repeat CT scan of the chest performed recently.  I personally and independently reviewed the scan images and  discussed the result with the patient and her daughter. Her scan showed decrease in the size of the subsolid nodule of the right upper lobe but demonstration of increased density likely postradiation changes and evolving postradiation changes of the right upper lobe and superior portion of the right lower lobe and no evidence of metastatic disease.  The airspace opacity has significantly improved compared to the previous scan performed less than 2 months ago. I recommended for the patient to continue on observation with repeat CT scan of the chest in 3 months. The patient has an appointment with Dr. Delton CoombesByrum tomorrow for evaluation of her pulmonary status. She was advised to call immediately if she has any other concerning symptoms in the interval.  The patient and her daughter had several questions and I answered them completely to their satisfaction.  The patient voices understanding of current disease status and treatment options and is in agreement with the current care plan.  All questions were answered. The patient knows to call the clinic with any problems, questions or concerns. We can certainly see the patient much sooner if necessary.  The total time spent in the appointment was 30 minutes.  Disclaimer: This note was dictated with voice recognition software. Similar sounding words can inadvertently be transcribed and may not be corrected upon review.

## 2022-04-24 ENCOUNTER — Ambulatory Visit: Payer: Medicare HMO | Admitting: Emergency Medicine

## 2022-04-24 ENCOUNTER — Encounter: Payer: Self-pay | Admitting: Emergency Medicine

## 2022-04-24 ENCOUNTER — Telehealth: Payer: Self-pay | Admitting: Emergency Medicine

## 2022-04-24 VITALS — BP 138/76 | HR 84 | Temp 98.0°F | Ht 66.0 in | Wt 160.2 lb

## 2022-04-24 DIAGNOSIS — J449 Chronic obstructive pulmonary disease, unspecified: Secondary | ICD-10-CM

## 2022-04-24 DIAGNOSIS — C3491 Malignant neoplasm of unspecified part of right bronchus or lung: Secondary | ICD-10-CM | POA: Diagnosis not present

## 2022-04-24 DIAGNOSIS — J700 Acute pulmonary manifestations due to radiation: Secondary | ICD-10-CM

## 2022-04-24 MED ORDER — SYMBICORT 160-4.5 MCG/ACT IN AERO: 2.0000 | INHALATION_SPRAY | Freq: Two times a day (BID) | RESPIRATORY_TRACT | 11 refills | Status: DC

## 2022-04-24 MED ORDER — SPIRIVA RESPIMAT 2.5 MCG/ACT IN AERS: INHALATION_SPRAY | RESPIRATORY_TRACT | 5 refills | Status: DC

## 2022-04-24 NOTE — Telephone Encounter (Signed)
During her appointment today, patient brought in a [employer] application for a handicap placard.  I completed the form and had Dr. Delton Coombes sign it and returned it to the patient and her daughter before she left the office.

## 2022-04-24 NOTE — Assessment & Plan Note (Signed)
Review of her CT chest from 04/21/2022 shows significant improvement in the right upper lobe and superior right lower lobe inflammatory change.  There is now some more confluent and linear scar present.  She will finish the prednisone taper over the next week.  She has improved some clinically.  Still requiring oxygen.  She is hoping that resolution of the pneumonitis will translate to improvement in her oxygen needs.  We will repeat a walking oximetry in about 3 weeks and retitrate her.

## 2022-04-24 NOTE — Progress Notes (Signed)
Subjective:    Patient ID: Joanne Rogers, female    DOB: 12/20/1945, 77 y.o.   MRN: [id number]  HPI  ROV 03/10/22 --follow-up visit 77 year old woman with severe COPD, right upper lobe adenocarcinoma diagnosed by navigational bronchoscopy being treated with concurrent chemoradiation., chronic rhinitis.  She was tried on Breztri - could not tolerate so on spiriva and symbicort.  She uses oxygen at 2 L/min with sleep.  Has albuterol and uses approximately 0-3 x a day.  She has been having increased exertional SOB for 4-5 days. No other associated sx. Very little cough. She does have some increased chest mucous today.   CT chest 03/05/22 reviewed by me shows no significant change in the small right upper lobe nodule although it is now more solid.  There is a confluent adjacent opacity with air bronchograms and patchy groundglass, new compared with prior done in 12/2021.  ROV 04/24/2022 --Joanne Rogers is 22 and follows up today for her history of severe COPD with associated nocturnal hypoxemia, right upper lobe adenocarcinoma diagnosed by Nav bronc in 09/2021.  On her most recent chest imaging 03/05/2022 there was a new focal infiltrate adjacent to her treated right upper lobe adenocarcinoma consistent with radiation pneumonitis.  I treated her with empiric doxycycline and started her on prednisone 40 mg for 4 weeks with a plan to taper.  She is currently on 10mg . She is compliant with her O2 at 3L/min.  Currently managed on Spiriva and Symbicort, using albuterol approximately She is having exertional SOB, some difficulty focusing, some cough.   CT chest 04/21/2022 reviewed by me shows that her right upper lobe subsolid nodule is smaller, more dense consistent with posttreatment changes.  The adjacent opacities in the right upper lobe and the superior right lower lobe are improved, more confluent and linear consistent with postradiation/pneumonitis scar.  There are stable groundglass pulmonary nodules in the right  lower lobe, right middle lobe.   Review of Systems As per HPI     Objective:   Physical Exam Vitals:   04/24/22 1529  BP: 138/76  Pulse: 84  Temp: 98 F (36.7 C)  TempSrc: Oral  SpO2: 96%  Weight: 160 lb 3.2 oz (72.7 kg)  Height: 5\' 6"  (1.676 m)   Gen: Pleasant, well-nourished, in no distress,  normal affect  ENT: No lesions,  mouth clear,  oropharynx clear, no postnasal drip  Neck: No JVD, no stridor  Lungs: No use of accessory muscles, no crackles or wheezing on normal respiration, no wheeze on forced expiration  Cardiovascular: RRR, heart sounds normal, no murmur or gallops, no peripheral edem  Musculoskeletal: No deformities, no cyanosis or clubbing  Neuro: alert, awake, non focal  Skin: Warm, no lesions or rash      Assessment & Plan:  Radiation pneumonitis Review of her CT chest from 04/21/2022 shows significant improvement in the right upper lobe and superior right lower lobe inflammatory change.  There is now some more confluent and linear scar present.  She will finish the prednisone taper over the next week.  She has improved some clinically.  Still requiring oxygen.  She is hoping that resolution of the pneumonitis will translate to improvement in her oxygen needs.  We will repeat a walking oximetry in about 3 weeks and retitrate her.  Adenocarcinoma of right lung, stage 2 (HCC) Most recent CT chest shows scar and more consolidation in the area of her previously existing nodule.  Hopefully a good treatment result.  Serial imaging as per  Dr. Asa Lente plans.  COPD GOLD III Plan continue her current bronchodilator regimen.  Continue Symbicort and Spiriva as you have been taking them Keep albuterol available to use 2 puffs up to every 4 hours if needed for shortness of breath, chest tightness, wheezing.   Levy Pupa, MD, PhD 04/24/2022, 4:32 PM [facility] [phone] or if no answer [phone]

## 2022-04-24 NOTE — Assessment & Plan Note (Signed)
Plan continue her current bronchodilator regimen.  Continue Symbicort and Spiriva as you have been taking them Keep albuterol available to use 2 puffs up to every 4 hours if needed for shortness of breath, chest tightness, wheezing.

## 2022-04-24 NOTE — Assessment & Plan Note (Signed)
Most recent CT chest shows scar and more consolidation in the area of her previously existing nodule.  Hopefully a good treatment result.  Serial imaging as per Dr. Asa Lente plans.

## 2022-04-24 NOTE — Patient Instructions (Signed)
Please complete your prednisone taper as planned. Let us know immediately if you develop symptoms when your prednisone finishes including change in breathing, fatigue, malaise, nausea/vomiting. Continue Symbicort and Spiriva as you have been taking them Keep albuterol available to use 2 puffs up to every 4 hours if needed for shortness of breath, chest tightness, wheezing.  Continue oxygen at 3 L/min. Get your repeat chest imaging as planned by Dr. Arbutus Ped. We will arrange for follow-up appointment and a walking oximetry in about 3 weeks to retitrate your oxygen.

## 2022-05-15 DIAGNOSIS — Z961 Presence of intraocular lens: Secondary | ICD-10-CM | POA: Diagnosis not present

## 2022-05-15 DIAGNOSIS — H04123 Dry eye syndrome of bilateral lacrimal glands: Secondary | ICD-10-CM | POA: Diagnosis not present

## 2022-05-15 DIAGNOSIS — H35372 Puckering of macula, left eye: Secondary | ICD-10-CM | POA: Diagnosis not present

## 2022-05-15 DIAGNOSIS — H52203 Unspecified astigmatism, bilateral: Secondary | ICD-10-CM | POA: Diagnosis not present

## 2022-05-15 DIAGNOSIS — H26493 Other secondary cataract, bilateral: Secondary | ICD-10-CM | POA: Diagnosis not present

## 2022-05-26 ENCOUNTER — Ambulatory Visit: Payer: Medicare HMO | Admitting: Emergency Medicine

## 2022-06-11 ENCOUNTER — Ambulatory Visit (INDEPENDENT_AMBULATORY_CARE_PROVIDER_SITE_OTHER): Payer: Medicare HMO | Admitting: Emergency Medicine

## 2022-06-11 ENCOUNTER — Encounter: Payer: Self-pay | Admitting: Emergency Medicine

## 2022-06-11 VITALS — BP 120/70 | HR 80 | Temp 98.0°F | Ht 65.0 in | Wt 149.8 lb

## 2022-06-11 DIAGNOSIS — J449 Chronic obstructive pulmonary disease, unspecified: Secondary | ICD-10-CM

## 2022-06-11 DIAGNOSIS — J700 Acute pulmonary manifestations due to radiation: Secondary | ICD-10-CM | POA: Diagnosis not present

## 2022-06-11 DIAGNOSIS — J9612 Chronic respiratory failure with hypercapnia: Secondary | ICD-10-CM | POA: Diagnosis not present

## 2022-06-11 DIAGNOSIS — I5032 Chronic diastolic (congestive) heart failure: Secondary | ICD-10-CM | POA: Diagnosis not present

## 2022-06-11 DIAGNOSIS — G8929 Other chronic pain: Secondary | ICD-10-CM | POA: Diagnosis not present

## 2022-06-11 DIAGNOSIS — C3491 Malignant neoplasm of unspecified part of right bronchus or lung: Secondary | ICD-10-CM

## 2022-06-11 DIAGNOSIS — E7800 Pure hypercholesterolemia, unspecified: Secondary | ICD-10-CM | POA: Diagnosis not present

## 2022-06-11 DIAGNOSIS — J9611 Chronic respiratory failure with hypoxia: Secondary | ICD-10-CM | POA: Diagnosis not present

## 2022-06-11 DIAGNOSIS — M8580 Other specified disorders of bone density and structure, unspecified site: Secondary | ICD-10-CM | POA: Diagnosis not present

## 2022-06-11 NOTE — Assessment & Plan Note (Signed)
Significantly improved on her CT chest from April.  She is off prednisone.  Plan to continue to follow her off of this.  She has a repeat CT chest scheduled for July to follow her adenocarcinoma and also the pneumonitis.  Will review.

## 2022-06-11 NOTE — Progress Notes (Signed)
   Subjective:    Patient ID: Joanne Rogers, female    DOB: September 25, 1945, 77 y.o.   MRN: [id number]  HPI  CT chest 04/21/2022 reviewed by me shows that her right upper lobe subsolid nodule is smaller, more dense consistent with posttreatment changes.  The adjacent opacities in the right upper lobe and the superior right lower lobe are improved, more confluent and linear consistent with postradiation/pneumonitis scar.  There are stable groundglass pulmonary nodules in the right lower lobe, right middle lobe.  ROV 06/11/2022 --77 year old woman with severe COPD, right upper lobe adenocarcinoma (09/2021) that was treated with XRT and complicated by apparent radiation pneumonitis.  I treated her with an extended course of prednisone.  Infiltrate improved significantly on imaging.  Prednisone has now been tapered off.  She is currently managed on Spiriva and Symbicort.  Oxygen at 3 L/min.  Today she reports that she has been doing fairly well. She not using much albuterol, not every day. Occasional am cough - clear mucous. No pred since our long taper. Due for CT chest in July.    Review of Systems As per HPI     Objective:   Physical Exam Vitals:   06/11/22 0854  BP: 120/70  Pulse: 80  Temp: 98 F (36.7 C)  TempSrc: Oral  SpO2: 97%  Weight: 149 lb 12.8 oz (67.9 kg)  Height: 5\' 5"  (1.651 m)    Gen: Pleasant, well-nourished, in no distress,  normal affect  ENT: No lesions,  mouth clear,  oropharynx clear, no postnasal drip  Neck: No JVD, no stridor  Lungs: No use of accessory muscles, no crackles or wheezing on normal respiration, no wheeze on forced expiration  Cardiovascular: RRR, heart sounds normal, no murmur or gallops, no peripheral edem  Musculoskeletal: No deformities, no cyanosis or clubbing  Neuro: alert, awake, non focal  Skin: Warm, no lesions or rash      Assessment & Plan:  Radiation pneumonitis (HCC) Significantly improved on her CT chest from April.  She is off  prednisone.  Plan to continue to follow her off of this.  She has a repeat CT chest scheduled for July to follow her adenocarcinoma and also the pneumonitis.  Will review.  Adenocarcinoma of right lung, stage 2 (HCC) Following with Dr. Arbutus Ped.  Repeat CT chest planned for July  COPD GOLD III Overall stable on Symbicort and Spiriva.  Minimal albuterol use.  No flares.  Plan to continue same.  Her alpha-1 antitrypsin genotype is MZ.  Consider rechecking her level next visit.  Chronic respiratory failure with hypoxia and hypercapnia (HCC) Chronic hypoxemia, documented on overnight oximetry.  Needs to continue nocturnal oxygen.  She is interested in possibly getting off exertional oxygen.  We will repeat her walking oximetry today, titrate her.  She has a POC  Levy Pupa, MD, PhD 06/11/2022, 9:09 AM [place] [facility] [phone] or if no answer [phone]

## 2022-06-11 NOTE — Assessment & Plan Note (Signed)
Following with Dr. Arbutus Ped.  Repeat CT chest planned for July

## 2022-06-11 NOTE — Patient Instructions (Signed)
We will repeat your walking oximetry today to determine whether you still require oxygen and at what flow rate Continue to use your oxygen at night while sleeping Continue your Spiriva and Symbicort as you have been taking them.  Rinse and gargle after your Symbicort Keep albuterol available to use 2 puffs if needed for shortness of breath, chest tightness, wheezing. Get your CT scan of the chest in July as per Dr. Asa Lente plans and follow-up with him to review. Follow with Dr Delton Coombes in 6 months or sooner if you have any problems

## 2022-06-11 NOTE — Assessment & Plan Note (Signed)
Chronic hypoxemia, documented on overnight oximetry.  Needs to continue nocturnal oxygen.  She is interested in possibly getting off exertional oxygen.  We will repeat her walking oximetry today, titrate her.  She has a POC

## 2022-06-11 NOTE — Assessment & Plan Note (Signed)
Overall stable on Symbicort and Spiriva.  Minimal albuterol use.  No flares.  Plan to continue same.  Her alpha-1 antitrypsin genotype is MZ.  Consider rechecking her level next visit.

## 2022-06-12 ENCOUNTER — Telehealth: Payer: Self-pay | Admitting: Emergency Medicine

## 2022-06-12 NOTE — Telephone Encounter (Signed)
PT calling. She wants to be sure we do not cancel her [place] O[place]. Even though her qual walk was good yesterday she got home and needed her O2.   Please call to advise @ [phone]

## 2022-06-14 ENCOUNTER — Encounter: Payer: Self-pay | Admitting: Emergency Medicine

## 2022-06-14 DIAGNOSIS — J449 Chronic obstructive pulmonary disease, unspecified: Secondary | ICD-10-CM

## 2022-06-15 NOTE — Telephone Encounter (Signed)
Dr. Delton Coombes- pt asking for rx to continue using her POC    Walk done at recent ov:  Default Flowsheet Data (all recorded)  Ambulatory Pulse Oximetry  Row Name 06/11/22 0932      Resting  Supplemental oxygen during test? No      Resting Heart Rate 75      Resting Sp02 100      Lap 1 (250 feet)  HR 84      02 Sat 94      Lap 2 (250 feet)  HR 87      02 Sat 90      Lap 3 (250 feet)  HR 83      02 Sat 98      Tech Comments: Pt completed 3 laps at a mod pace with 1 rest stop need at lap 2.5. Pt had no c/o at end of walk

## 2022-06-15 NOTE — Telephone Encounter (Signed)
Patient checking on message for oxygen. Patient phone number is [phone].

## 2022-06-16 ENCOUNTER — Telehealth: Payer: Self-pay | Admitting: Emergency Medicine

## 2022-06-16 NOTE — Telephone Encounter (Signed)
Please see telephone message from 06/14/2022. Nothing further needed.

## 2022-06-16 NOTE — Telephone Encounter (Signed)
Patient states needs refill for Symbicort. Pharmacy is [facility] delivery. Patient phone number is [phone].

## 2022-06-17 MED ORDER — SYMBICORT 160-4.5 MCG/ACT IN AERO: 2.0000 | INHALATION_SPRAY | Freq: Two times a day (BID) | RESPIRATORY_TRACT | 3 refills | Status: DC

## 2022-06-17 NOTE — Telephone Encounter (Signed)
Rx for Symbicort has been sent to mail order pharmacy for pt.  Attempted to call pt but unable to reach. Left message letting her know this had been done. Nothing further needed.

## 2022-06-22 ENCOUNTER — Other Ambulatory Visit: Payer: Self-pay | Admitting: Cardiology

## 2022-06-22 NOTE — Telephone Encounter (Signed)
Ok with me to order a 6-minute walk on RA to see if she can qualify

## 2022-06-29 ENCOUNTER — Telehealth: Payer: Self-pay | Admitting: Internal Medicine

## 2022-06-29 NOTE — Telephone Encounter (Signed)
Called patient regarding July appointment, patient is notified.  

## 2022-06-30 ENCOUNTER — Ambulatory Visit (INDEPENDENT_AMBULATORY_CARE_PROVIDER_SITE_OTHER): Payer: Medicare HMO | Admitting: Emergency Medicine

## 2022-06-30 DIAGNOSIS — J9611 Chronic respiratory failure with hypoxia: Secondary | ICD-10-CM

## 2022-06-30 NOTE — [place] Notes (Signed)
Qualification walk completed today. Pt did not qualify for POC because O2 never dropped below 94% on RA. Pt was very wended at end of walk.

## 2022-07-01 ENCOUNTER — Telehealth: Payer: Self-pay | Admitting: Emergency Medicine

## 2022-07-01 NOTE — Telephone Encounter (Signed)
pt. was seen yesterday and did walk test she need a order to return the poc to the company but they wouldn't take it back

## 2022-07-01 NOTE — Telephone Encounter (Signed)
Hope daughter would like to discuss patient's walk test. [facility] phone number is [phone].

## 2022-07-02 NOTE — Telephone Encounter (Signed)
I will not d/c the O2 at this time. Would recommend that we arrange for a 6 minutes walk at some point on RA to confirm that she intermittently desaturates

## 2022-07-02 NOTE — Telephone Encounter (Signed)
Spoke with patients daughter [place].  After last apt with Dr. Delton Coombes patient did not qualify for POC. Daughter states due to the heat she is more SOB and is having to still POC machine when going out.  Dr. Delton Coombes please advise? Patient is wanting to keep POC machine and is worried he health will decline if she no longer has it

## 2022-07-02 NOTE — Telephone Encounter (Signed)
Spoke with patients daughter. Advised per Dr. Delton Coombes he wanted her to have repeat 6mw on room air. Patient has been scheduled for next month. nfn

## 2022-07-13 ENCOUNTER — Telehealth: Payer: Self-pay | Admitting: Emergency Medicine

## 2022-07-13 MED ORDER — BREZTRI AEROSPHERE 160-9-4.8 MCG/ACT IN AERO: 2.0000 | INHALATION_SPRAY | Freq: Two times a day (BID) | RESPIRATORY_TRACT | 0 refills | Status: DC

## 2022-07-13 NOTE — Telephone Encounter (Signed)
Called and spoke with patient. She is aware that I have left 2 samples of Breztri at the front desk for her. She will stop by the office tomorrow to pick them up.   Nothing further needed at time of call.

## 2022-07-13 NOTE — Telephone Encounter (Signed)
Pt is facing a financial hardship is needing a sample of breztri,

## 2022-07-20 ENCOUNTER — Inpatient Hospital Stay: Payer: Medicare HMO | Attending: Physician Assistant

## 2022-07-20 ENCOUNTER — Ambulatory Visit (HOSPITAL_COMMUNITY)
Admission: RE | Admit: 2022-07-20 | Discharge: 2022-07-20 | Disposition: A | Discharge status: Home / Self Care | Payer: Medicare HMO | Source: Ambulatory Visit | Attending: Internal Medicine | Admitting: Internal Medicine

## 2022-07-20 ENCOUNTER — Encounter (HOSPITAL_COMMUNITY): Payer: Self-pay

## 2022-07-20 DIAGNOSIS — K449 Diaphragmatic hernia without obstruction or gangrene: Secondary | ICD-10-CM | POA: Insufficient documentation

## 2022-07-20 DIAGNOSIS — C3490 Malignant neoplasm of unspecified part of unspecified bronchus or lung: Secondary | ICD-10-CM | POA: Diagnosis not present

## 2022-07-20 DIAGNOSIS — Z79899 Other long term (current) drug therapy: Secondary | ICD-10-CM | POA: Insufficient documentation

## 2022-07-20 DIAGNOSIS — Z79624 Long term (current) use of inhibitors of nucleotide synthesis: Secondary | ICD-10-CM | POA: Insufficient documentation

## 2022-07-20 DIAGNOSIS — I2510 Atherosclerotic heart disease of native coronary artery without angina pectoris: Secondary | ICD-10-CM | POA: Insufficient documentation

## 2022-07-20 DIAGNOSIS — Z9221 Personal history of antineoplastic chemotherapy: Secondary | ICD-10-CM | POA: Insufficient documentation

## 2022-07-20 DIAGNOSIS — J449 Chronic obstructive pulmonary disease, unspecified: Secondary | ICD-10-CM | POA: Insufficient documentation

## 2022-07-20 DIAGNOSIS — Z9981 Dependence on supplemental oxygen: Secondary | ICD-10-CM | POA: Insufficient documentation

## 2022-07-20 DIAGNOSIS — E7800 Pure hypercholesterolemia, unspecified: Secondary | ICD-10-CM | POA: Insufficient documentation

## 2022-07-20 DIAGNOSIS — E041 Nontoxic single thyroid nodule: Secondary | ICD-10-CM | POA: Diagnosis not present

## 2022-07-20 DIAGNOSIS — I700 Atherosclerosis of aorta: Secondary | ICD-10-CM | POA: Diagnosis not present

## 2022-07-20 DIAGNOSIS — Z923 Personal history of irradiation: Secondary | ICD-10-CM | POA: Insufficient documentation

## 2022-07-20 DIAGNOSIS — Z7951 Long term (current) use of inhaled steroids: Secondary | ICD-10-CM | POA: Insufficient documentation

## 2022-07-20 DIAGNOSIS — M5136 Other intervertebral disc degeneration, lumbar region: Secondary | ICD-10-CM | POA: Insufficient documentation

## 2022-07-20 DIAGNOSIS — Z7952 Long term (current) use of systemic steroids: Secondary | ICD-10-CM | POA: Insufficient documentation

## 2022-07-20 DIAGNOSIS — Z7982 Long term (current) use of aspirin: Secondary | ICD-10-CM | POA: Insufficient documentation

## 2022-07-20 DIAGNOSIS — R0602 Shortness of breath: Secondary | ICD-10-CM | POA: Insufficient documentation

## 2022-07-20 DIAGNOSIS — M129 Arthropathy, unspecified: Secondary | ICD-10-CM | POA: Insufficient documentation

## 2022-07-20 DIAGNOSIS — C3411 Malignant neoplasm of upper lobe, right bronchus or lung: Secondary | ICD-10-CM | POA: Insufficient documentation

## 2022-07-20 LAB — CBC WITH DIFFERENTIAL (CANCER CENTER ONLY)
Abs Immature Granulocytes: 0.02 10*3/uL (ref 0.00–0.07)
Basophils Absolute: 0 10*3/uL (ref 0.0–0.1)
Basophils Relative: 0 %
Eosinophils Absolute: 0.2 10*3/uL (ref 0.0–0.5)
Eosinophils Relative: 2 %
HCT: 35.2 % — ABNORMAL LOW (ref 36.0–46.0)
Hemoglobin: 11.6 g/dL — ABNORMAL LOW (ref 12.0–15.0)
Immature Granulocytes: 0 %
Lymphocytes Relative: 15 %
Lymphs Abs: 1 10*3/uL (ref 0.7–4.0)
MCH: 33.1 pg (ref 26.0–34.0)
MCHC: 33 g/dL (ref 30.0–36.0)
MCV: 100.6 fL — ABNORMAL HIGH (ref 80.0–100.0)
Monocytes Absolute: 0.6 10*3/uL (ref 0.1–1.0)
Monocytes Relative: 10 %
Neutro Abs: 4.7 10*3/uL (ref 1.7–7.7)
Neutrophils Relative %: 73 %
Platelet Count: 212 10*3/uL (ref 150–400)
RBC: 3.5 MIL/uL — ABNORMAL LOW (ref 3.87–5.11)
RDW: 12.2 % (ref 11.5–15.5)
WBC Count: 6.5 10*3/uL (ref 4.0–10.5)
nRBC: 0 % (ref 0.0–0.2)

## 2022-07-20 LAB — CMP (CANCER CENTER ONLY)
ALT: 16 U/L (ref 0–44)
AST: 18 U/L (ref 15–41)
Albumin: 3.8 g/dL (ref 3.5–5.0)
Alkaline Phosphatase: 78 U/L (ref 38–126)
Anion gap: 6 (ref 5–15)
BUN: 13 mg/dL (ref 8–23)
CO2: 30 mmol/L (ref 22–32)
Calcium: 9.7 mg/dL (ref 8.9–10.3)
Chloride: 97 mmol/L — ABNORMAL LOW (ref 98–111)
Creatinine: 0.62 mg/dL (ref 0.44–1.00)
GFR, Estimated: >60 mL/min (ref >60)
Glucose, Bld: 110 mg/dL — ABNORMAL HIGH (ref 70–99)
Potassium: 4.1 mmol/L (ref 3.5–5.1)
Sodium: 133 mmol/L — ABNORMAL LOW (ref 135–145)
Total Bilirubin: 0.4 mg/dL (ref 0.3–1.2)
Total Protein: 7.1 g/dL (ref 6.5–8.1)

## 2022-07-20 MED ORDER — IOHEXOL 300 MG/ML  SOLN
75.0000 mL | Freq: Once | INTRAMUSCULAR | Status: AC | PRN
  Administered 2022-07-20: 75 mL via INTRAVENOUS

## 2022-07-21 ENCOUNTER — Inpatient Hospital Stay: Payer: Medicare HMO

## 2022-07-23 ENCOUNTER — Inpatient Hospital Stay: Payer: Medicare HMO | Admitting: Internal Medicine

## 2022-07-23 ENCOUNTER — Other Ambulatory Visit: Payer: Self-pay

## 2022-07-23 VITALS — BP 132/54 | HR 80 | Temp 98.4°F | Resp 17 | Ht 65.0 in | Wt 157.8 lb

## 2022-07-23 DIAGNOSIS — Z7952 Long term (current) use of systemic steroids: Secondary | ICD-10-CM | POA: Diagnosis not present

## 2022-07-23 DIAGNOSIS — E7800 Pure hypercholesterolemia, unspecified: Secondary | ICD-10-CM | POA: Diagnosis not present

## 2022-07-23 DIAGNOSIS — C3490 Malignant neoplasm of unspecified part of unspecified bronchus or lung: Secondary | ICD-10-CM

## 2022-07-23 DIAGNOSIS — Z923 Personal history of irradiation: Secondary | ICD-10-CM | POA: Diagnosis not present

## 2022-07-23 DIAGNOSIS — Z7982 Long term (current) use of aspirin: Secondary | ICD-10-CM | POA: Diagnosis not present

## 2022-07-23 DIAGNOSIS — Z9981 Dependence on supplemental oxygen: Secondary | ICD-10-CM | POA: Diagnosis not present

## 2022-07-23 DIAGNOSIS — I2510 Atherosclerotic heart disease of native coronary artery without angina pectoris: Secondary | ICD-10-CM | POA: Diagnosis not present

## 2022-07-23 DIAGNOSIS — I700 Atherosclerosis of aorta: Secondary | ICD-10-CM | POA: Diagnosis not present

## 2022-07-23 DIAGNOSIS — R0602 Shortness of breath: Secondary | ICD-10-CM | POA: Diagnosis not present

## 2022-07-23 DIAGNOSIS — M129 Arthropathy, unspecified: Secondary | ICD-10-CM | POA: Diagnosis not present

## 2022-07-23 DIAGNOSIS — Z79899 Other long term (current) drug therapy: Secondary | ICD-10-CM | POA: Diagnosis not present

## 2022-07-23 DIAGNOSIS — Z79624 Long term (current) use of inhibitors of nucleotide synthesis: Secondary | ICD-10-CM | POA: Diagnosis not present

## 2022-07-23 DIAGNOSIS — K449 Diaphragmatic hernia without obstruction or gangrene: Secondary | ICD-10-CM | POA: Diagnosis not present

## 2022-07-23 DIAGNOSIS — J449 Chronic obstructive pulmonary disease, unspecified: Secondary | ICD-10-CM | POA: Diagnosis not present

## 2022-07-23 DIAGNOSIS — C3411 Malignant neoplasm of upper lobe, right bronchus or lung: Secondary | ICD-10-CM | POA: Diagnosis not present

## 2022-07-23 DIAGNOSIS — E041 Nontoxic single thyroid nodule: Secondary | ICD-10-CM | POA: Diagnosis not present

## 2022-07-23 DIAGNOSIS — Z7951 Long term (current) use of inhaled steroids: Secondary | ICD-10-CM | POA: Diagnosis not present

## 2022-07-23 DIAGNOSIS — M5136 Other intervertebral disc degeneration, lumbar region: Secondary | ICD-10-CM | POA: Diagnosis not present

## 2022-07-23 DIAGNOSIS — Z9221 Personal history of antineoplastic chemotherapy: Secondary | ICD-10-CM | POA: Diagnosis not present

## 2022-07-23 NOTE — [place] Notes (Signed)
[facility] Telephone:([phone]) [phone]   Fax:([phone]) [phone]  OFFICE PROGRESS NOTE  Shon Hale, MD [address] [place] [address]  DIAGNOSIS: Unresectable stage IIb (T1c, No/N1, M0) non-small cell lung cancer, adenocarcinoma.  The patient presented with right upper lobe groundglass lesion and hypermetabolic right hilar lymph node.  She was diagnosed in September 2023.   PRIOR THERAPY: Concurrent chemoradiation with weekly carboplatin for AUC of 2 and paclitaxel 45 Mg/M2.  First dose October 27, 2021.  Status post 7 cycles.  Last dose was given in December 08, 2021  CURRENT THERAPY: Observation  INTERVAL HISTORY: Cheyann Blecha Kinchen 77 y.o. female returns to the clinic today for follow-up visit accompanied by her daughter.  The patient continues to have the shortness of breath at baseline increased with exertion with mild cough and no hemoptysis or chest pain.  She denied having any recent weight loss or night sweats.  She has no fever or chills.  She has no headache or visual changes.  She denied having any nausea, vomiting, diarrhea or constipation.  She had repeat CT scan of the chest performed recently and she is here for evaluation and discussion of her scan results.   MEDICAL HISTORY: Past Medical History:  Diagnosis Date   Arthritis    back, hands, neck   COPD (chronic obstructive pulmonary disease) (HCC)    Coronary artery disease 11/28/2019   CCTA 11/28/19: Mild non-obstructive CAD, myocardial bridge mid LAD (cardiologist Dr. Georgeanna Lea, 08/19/21)   DDD (degenerative disc disease), lumbar    Dyspnea    uses oxygen at home   H/O bronchitis    allergy related   Hypercholesterolemia    Oxygen dependent    Palpitations    "fluttering"   Pneumonia    hx of    ALLERGIES:  is allergic to codeine, amoxicillin, latex, levofloxacin, and statins.  MEDICATIONS:  Current Outpatient Medications  Medication Sig Dispense Refill   acetaminophen  (TYLENOL) 500 MG tablet Take 1,000 mg by mouth every 6 (six) hours as needed for moderate pain.     albuterol (VENTOLIN HFA) 108 (90 Base) MCG/ACT inhaler Inhale 1-2 puffs into the lungs every 4 (four) hours as needed for wheezing or shortness of breath. 1 each 5   ascorbic acid (VITAMIN C) 500 MG tablet Take 500 mg by mouth daily.     aspirin 81 MG tablet Take 81 mg by mouth at bedtime.     azelastine (ASTELIN) 0.1 % nasal spray Place 1 spray into both nostrils as needed for rhinitis.     beta carotene w/minerals (OCUVITE) tablet Take 1 tablet by mouth at bedtime.     bisoprolol (ZEBETA) 5 MG tablet TAKE 1 TABLET EVERY DAY 90 tablet 3   Budeson-Glycopyrrol-Formoterol (BREZTRI AEROSPHERE) 160-9-4.8 MCG/ACT AERO Inhale 2 puffs into the lungs in the morning and at bedtime. 2 each 0   cetirizine (ZYRTEC) 10 MG tablet Take 10 mg by mouth at bedtime.     cholecalciferol (VITAMIN D3) 25 MCG (1000 UNIT) tablet Take 1,000 Units by mouth daily.     Cyanocobalamin (B-12) 2500 MCG TABS Take 2,500 mcg by mouth daily.     doxycycline (VIBRA-TABS) 100 MG tablet Take 1 tablet (100 mg total) by mouth 2 (two) times daily. 28 tablet 0   famotidine (PEPCID) 20 MG tablet One at bedtime (Patient not taking: Reported on 06/11/2022) 30 tablet 11   ibuprofen (ADVIL) 200 MG tablet Take 400 mg by mouth  every 6 (six) hours as needed for moderate pain.     ipratropium-albuterol (DUONEB) 0.5-2.5 (3) MG/3ML SOLN Take 3 mLs by nebulization every 4 (four) hours. Dx: J44.9 360 mL 1   Multiple Minerals-Vitamins (CAL MAG ZINC +D3) TABS Take 1-2 tablets by mouth See admin instructions. Take 1 tablet in the morning and 2 tablets in the evening     OXYGEN 2lpm with sleep     Polyethyl Glycol-Propyl Glycol (SYSTANE OP) Place 1 drop into both eyes daily as needed (dry eyes).     predniSONE (DELTASONE) 10 MG tablet Take 6 tabs by mouth for 3 days, then 5 for 3 days, 4 for 3 days, 3 for 3 days, 2 for 3 days, 1 for 3 days and stop 63 tablet  0   predniSONE (DELTASONE) 10 MG tablet Take 4 tablets (40 mg total) by mouth daily with breakfast. 56 tablet 0   prochlorperazine (COMPAZINE) 10 MG tablet Take 1 tablet (10 mg total) by mouth every 6 (six) hours as needed. 30 tablet 2   Respiratory Therapy Supplies (FLUTTER) DEVI 1 Device by Does not apply route as needed. 1 each 0   SYMBICORT 160-4.5 MCG/ACT inhaler Inhale 2 puffs into the lungs in the morning and at bedtime. 30.6 g 3   Tiotropium Bromide Monohydrate (SPIRIVA RESPIMAT) 2.5 MCG/ACT AERS INHALE 2 PUFFS INTO LUNGS ONCE DAILY 4 g 5   valACYclovir (VALTREX) 1000 MG tablet Take 500 mg by mouth every other day.      No current facility-administered medications for this visit.    SURGICAL HISTORY:  Past Surgical History:  Procedure Laterality Date   BRONCHIAL BIOPSY  09/29/2021   Procedure: BRONCHIAL BIOPSIES;  Surgeon: Leslye Peer, MD;  Location: [facility] ENDOSCOPY;  Service: Pulmonary;;   BRONCHIAL BRUSHINGS  09/29/2021   Procedure: BRONCHIAL BRUSHINGS;  Surgeon: Leslye Peer, MD;  Location[facility] ENDOSCOPY;  Service: Pulmonary;;   BRONCHIAL NEEDLE ASPIRATION BIOPSY  09/29/2021   Procedure: BRONCHIAL NEEDLE ASPIRATION BIOPSIES;  Surgeon: Leslye Peer, MD;  Location: [facility] ENDOSCOPY;  Service: Pulmonary;;   CARDIOVASCULAR STRESS TEST  11/16/2005   EF 70%, NO ISCHEMIA   CARPAL TUNNEL RELEASE Right ~1988   DILATION AND CURETTAGE OF UTERUS     x3: x2 for miscarrages and x1 for polyp   HEMI-MICRODISCECTOMY LUMBAR LAMINECTOMY LEVEL 1 Left 07/20/2014   Procedure: centeral decompression,L4-L5, HEMI-LAMINECTOMY MICRODISCECTOMY L4 - L5 ON THE LEFT  LEVEL 1, forimatomy 4-5 root on the left;  Surgeon: Ranee Gosselin, MD;  Location: [facility] ORS;  Service: Orthopedics;  Laterality: Left;   US ECHOCARDIOGRAPHY  11/19/2005   EF 55-60%   VIDEO BRONCHOSCOPY WITH ENDOBRONCHIAL ULTRASOUND Right 09/29/2021   Procedure: VIDEO BRONCHOSCOPY WITH ENDOBRONCHIAL ULTRASOUND;  Surgeon: Leslye Peer, MD;  Location:  [facility] ENDOSCOPY;  Service: Pulmonary;  Laterality: Right;   VIDEO BRONCHOSCOPY WITH RADIAL ENDOBRONCHIAL ULTRASOUND  09/29/2021   Procedure: VIDEO BRONCHOSCOPY WITH RADIAL ENDOBRONCHIAL ULTRASOUND;  Surgeon: Leslye Peer, MD;  Location: [facility] ENDOSCOPY;  Service: Pulmonary;;    REVIEW OF SYSTEMS:  Constitutional: positive for fatigue Eyes: negative Ears, nose, mouth, throat, and face: negative Respiratory: positive for cough and dyspnea on exertion Cardiovascular: negative Gastrointestinal: negative Genitourinary:negative Integument/breast: negative Hematologic/lymphatic: negative Musculoskeletal:negative Neurological: negative Behavioral/Psych: negative Endocrine: negative Allergic/Immunologic: negative   PHYSICAL EXAMINATION: General appearance: alert, cooperative, fatigued, and no distress Head: Normocephalic, without obvious abnormality, atraumatic Neck: no adenopathy, no JVD, supple, symmetrical, trachea midline, and thyroid not enlarged, symmetric, no tenderness/mass/nodules Lymph nodes: Cervical, supraclavicular,  and axillary nodes normal. Resp: clear to auscultation bilaterally Back: symmetric, no curvature. ROM normal. No CVA tenderness. Cardio: regular rate and rhythm, S1, S2 normal, no murmur, click, rub or gallop GI: soft, non-tender; bowel sounds normal; no masses,  no organomegaly Extremities: extremities normal, atraumatic, no cyanosis or edema Neurologic: Alert and oriented X 3, normal strength and tone. Normal symmetric reflexes. Normal coordination and gait  ECOG PERFORMANCE STATUS: 1 - Symptomatic but completely ambulatory  Blood pressure (!) 132/54, pulse 80, temperature 98.4 F (36.9 C), temperature source Oral, resp. rate 17, height 5\' 5"  (1.651 m), weight 157 lb 12.8 oz (71.6 kg), SpO2 95%.  LABORATORY DATA: Lab Results  Component Value Date   WBC 6.5 07/20/2022   HGB 11.6 (L) 07/20/2022   HCT 35.2 (L) 07/20/2022   MCV 100.6 (H) 07/20/2022   PLT 212  07/20/2022      Chemistry      Component Value Date/Time   NA 133 (L) 07/20/2022 1526   NA 137 05/08/2020 0909   K 4.1 07/20/2022 1526   CL 97 (L) 07/20/2022 1526   CO2 30 07/20/2022 1526   BUN 13 07/20/2022 1526   BUN 8 05/08/2020 0909   CREATININE 0.62 07/20/2022 1526      Component Value Date/Time   CALCIUM 9.7 07/20/2022 1526   ALKPHOS 78 07/20/2022 1526   AST 18 07/20/2022 1526   ALT 16 07/20/2022 1526   BILITOT 0.4 07/20/2022 1526       RADIOGRAPHIC STUDIES: CT Chest W Contrast  Result Date: 07/22/2022 CLINICAL DATA:  Staging non-small-cell lung cancer. * Tracking Code: BO * EXAM: CT CHEST WITH CONTRAST TECHNIQUE: Multidetector CT imaging of the chest was performed during intravenous contrast administration. RADIATION DOSE REDUCTION: This exam was performed according to the departmental dose-optimization program which includes automated exposure control, adjustment of the mA and/or kV according to patient size and/or use of iterative reconstruction technique. CONTRAST:  75mL OMNIPAQUE IOHEXOL 300 MG/ML  SOLN COMPARISON:  CT chest dated 04/21/2022 FINDINGS: Cardiovascular: Normal heart size. No significant pericardial fluid/thickening. Great vessels are normal in course and caliber. No central pulmonary emboli. Coronary artery calcifications and aortic atherosclerosis. Mediastinum/Nodes: Increased conspicuity of 1.6 cm right thyroid nodule (2:5). Normal esophagus. No pathologically enlarged axillary, supraclavicular, mediastinal, or hilar lymph nodes. Lungs/Pleura: The central airways are patent. Similar postradiation changes of the right upper lobe with linear scarring along the superior segment right lower lobe. Continued increased density of subsolid peribronchovascular nodule in the right upper lobe measuring 1.6 x 1.0 cm (7:60). Unchanged 10 mm right middle lobe ground-glass nodule (7:75) and 8 mm right lower lobe ground-glass nodule (7:80). No pneumothorax. No pleural effusion.  Upper abdomen: Small fat containing posteromedial right diaphragmatic hernia. Unchanged scattered hepatic hypodensities, likely cysts. Musculoskeletal: No acute or abnormal lytic or blastic osseous lesions. IMPRESSION: 1. Continued increased density of subsolid peribronchovascular nodule in the right upper lobe in an area of postradiation changes, likely also reflects posttreatment change. Continued attention on follow-up. 2. Unchanged 10 mm right middle lobe and 8 mm right lower lobe ground-glass nodules. No new or enlarging nodules. 3. Increased conspicuity of 1.6 cm right thyroid nodule. Recommend thyroid US (ref: J Am Coll Radiol. 2015 Feb;12(2): 143-50). 4. Aortic Atherosclerosis (ICD10-I70.0). Coronary artery calcifications. Assessment for potential risk factor modification, dietary therapy or pharmacologic therapy may be warranted, if clinically indicated. Electronically Signed   By: Agustin Cree M.D.   On: 07/22/2022 10:34    ASSESSMENT AND PLAN: This is  a very pleasant 77 years old white female with Unresectable stage IIb (T1c, No/N1, M0) non-small cell lung cancer, adenocarcinoma.  The patient presented with right upper lobe groundglass lesion and hypermetabolic right hilar lymph node.  She was diagnosed in September 2023. The patient underwent a course of concurrent chemoradiation with weekly carboplatin for AUC of 2 and paclitaxel 45 Mg/M2 status post 7 cycle. She has a rough time tolerating the previous treatment with increasing fatigue and weakness. He is currently on observation and feeling fine except for the baseline shortness of breath. She had repeat CT scan of the chest performed recently.  I personally and independently reviewed the scan images and discussed the result and showed the images to the patient and her daughter.  Her scan showed continuous evaluation of the postradiation changes in the right lung with no clear evidence for disease progression.  There was increased conspicuity of a  right thyroid nodule measuring 1.6 cm. I recommended for the patient to continue on observation with repeat CT scan of the chest in 6 months. Regarding the right thyroid nodule, I will order ultrasound of the thyroid and if suspicious we will consider her for fine-needle aspiration. For the history of COPD, the patient will continue her routine follow-up visit and evaluation by pulmonary medicine. She was advised to call immediately if she has any other concerning symptoms in the interval. The patient voices understanding of current disease status and treatment options and is in agreement with the current care plan.  All questions were answered. The patient knows to call the clinic with any problems, questions or concerns. We can certainly see the patient much sooner if necessary.  The total time spent in the appointment was 30 minutes.  Disclaimer: This note was dictated with voice recognition software. Similar sounding words can inadvertently be transcribed and may not be corrected upon review.

## 2022-07-28 ENCOUNTER — Ambulatory Visit (HOSPITAL_COMMUNITY)
Admission: RE | Admit: 2022-07-28 | Discharge: 2022-07-28 | Disposition: A | Discharge status: Home / Self Care | Payer: Medicare HMO | Source: Ambulatory Visit | Attending: Internal Medicine | Admitting: Internal Medicine

## 2022-07-28 DIAGNOSIS — E042 Nontoxic multinodular goiter: Secondary | ICD-10-CM | POA: Diagnosis not present

## 2022-07-28 DIAGNOSIS — C3490 Malignant neoplasm of unspecified part of unspecified bronchus or lung: Secondary | ICD-10-CM | POA: Insufficient documentation

## 2022-08-03 ENCOUNTER — Ambulatory Visit: Payer: Medicare HMO

## 2022-08-04 ENCOUNTER — Ambulatory Visit: Payer: Medicare HMO

## 2022-09-08 ENCOUNTER — Ambulatory Visit: Payer: Medicare HMO | Attending: Cardiology | Admitting: Cardiology

## 2022-09-08 ENCOUNTER — Encounter: Payer: Self-pay | Admitting: Cardiology

## 2022-09-08 VITALS — BP 124/66 | HR 74 | Ht 65.0 in | Wt 161.2 lb

## 2022-09-08 DIAGNOSIS — I5032 Chronic diastolic (congestive) heart failure: Secondary | ICD-10-CM

## 2022-09-08 DIAGNOSIS — E782 Mixed hyperlipidemia: Secondary | ICD-10-CM | POA: Diagnosis not present

## 2022-09-08 DIAGNOSIS — I2510 Atherosclerotic heart disease of native coronary artery without angina pectoris: Secondary | ICD-10-CM | POA: Diagnosis not present

## 2022-09-08 NOTE — Progress Notes (Signed)
Cardiology Office Note:    Date:  09/12/2022   ID:  Joanne Rogers, DOB Jun 21, 1945, MRN [id number]  PCP:  Shon Hale, MD  Cardiologist:  Thomasene Ripple, DO  Electrophysiologist:  None   Referring MD: Shon Hale, *   I am doing fine  History of Present Illness:    Joanne Rogers is a 77 y.o. female with a hx of mild coronary artery disease seen on coronary CTA, hyperlipidemia here for follow-up visit.  At her last visit we talked about her coronary artery disease her statin intolerance and possible consideration for other lipid-lowering agent including PCSK9 inhibitors.  I referred the patient to the lipid clinic fortunately she did not follow through with this.  She offers no specific complaints at this time.  Past Medical History:  Diagnosis Date   Arthritis    back, hands, neck   COPD (chronic obstructive pulmonary disease) (HCC)    Coronary artery disease 11/28/2019   CCTA 11/28/19: Mild non-obstructive CAD, myocardial bridge mid LAD (cardiologist Dr. Georgeanna Lea, 08/19/21)   DDD (degenerative disc disease), lumbar    Dyspnea    uses oxygen at home   H/O bronchitis    allergy related   Hypercholesterolemia    Oxygen dependent    Palpitations    "fluttering"   Pneumonia    hx of    Past Surgical History:  Procedure Laterality Date   BRONCHIAL BIOPSY  09/29/2021   Procedure: BRONCHIAL BIOPSIES;  Surgeon: Leslye Peer, MD;  Location: [facility] ENDOSCOPY;  Service: Pulmonary;;   BRONCHIAL BRUSHINGS  09/29/2021   Procedure: BRONCHIAL BRUSHINGS;  Surgeon: Leslye Peer, MD;  Location: [facility] ENDOSCOPY;  Service: Pulmonary;;   BRONCHIAL NEEDLE ASPIRATION BIOPSY  09/29/2021   Procedure: BRONCHIAL NEEDLE ASPIRATION BIOPSIES;  Surgeon: Leslye Peer, MD;  Location: [facility] ENDOSCOPY;  Service: Pulmonary;;   CARDIOVASCULAR STRESS TEST  11/16/2005   EF 70%, NO ISCHEMIA   CARPAL TUNNEL RELEASE Right ~1988   DILATION AND CURETTAGE OF UTERUS     x3: x2 for miscarrages and x1  for polyp   HEMI-MICRODISCECTOMY LUMBAR LAMINECTOMY LEVEL 1 Left 07/20/2014   Procedure: centeral decompression,L4-L5, HEMI-LAMINECTOMY MICRODISCECTOMY L4 - L5 ON THE LEFT  LEVEL 1, forimatomy 4-5 root on the left;  Surgeon: Ranee Gosselin, MD;  Location: [facility] ORS;  Service: Orthopedics;  Laterality: Left;   US ECHOCARDIOGRAPHY  11/19/2005   EF 55-60%   VIDEO BRONCHOSCOPY WITH ENDOBRONCHIAL ULTRASOUND Right 09/29/2021   Procedure: VIDEO BRONCHOSCOPY WITH ENDOBRONCHIAL ULTRASOUND;  Surgeon: Leslye Peer, MD;  Location: [facility] ENDOSCOPY;  Service: Pulmonary;  Laterality: Right;   VIDEO BRONCHOSCOPY WITH RADIAL ENDOBRONCHIAL ULTRASOUND  09/29/2021   Procedure: VIDEO BRONCHOSCOPY WITH RADIAL ENDOBRONCHIAL ULTRASOUND;  Surgeon: Leslye Peer, MD;  Location: [facility] ENDOSCOPY;  Service: Pulmonary;;    Current Medications: Current Meds  Medication Sig   acetaminophen (TYLENOL) 500 MG tablet Take 1,000 mg by mouth every 6 (six) hours as needed for moderate pain.   albuterol (VENTOLIN HFA) 108 (90 Base) MCG/ACT inhaler Inhale 1-2 puffs into the lungs every 4 (four) hours as needed for wheezing or shortness of breath.   ascorbic acid (VITAMIN C) 500 MG tablet Take 500 mg by mouth daily.   aspirin 81 MG tablet Take 81 mg by mouth at bedtime.   azelastine (ASTELIN) 0.1 % nasal spray Place 1 spray into both nostrils as needed for rhinitis.   beta carotene w/minerals (OCUVITE) tablet Take 1 tablet by mouth at bedtime.  bisoprolol (ZEBETA) 5 MG tablet TAKE 1 TABLET EVERY DAY   Budeson-Glycopyrrol-Formoterol (BREZTRI AEROSPHERE) 160-9-4.8 MCG/ACT AERO Inhale 2 puffs into the lungs in the morning and at bedtime.   cetirizine (ZYRTEC) 10 MG tablet Take 10 mg by mouth at bedtime.   cholecalciferol (VITAMIN D3) 25 MCG (1000 UNIT) tablet Take 1,000 Units by mouth daily.   Cyanocobalamin (B-12) 2500 MCG TABS Take 2,500 mcg by mouth daily.   famotidine (PEPCID) 20 MG tablet One at bedtime   ibuprofen (ADVIL) 200 MG tablet  Take 400 mg by mouth every 6 (six) hours as needed for moderate pain.   ipratropium-albuterol (DUONEB) 0.5-2.5 (3) MG/3ML SOLN Take 3 mLs by nebulization every 4 (four) hours. Dx: J44.9   Multiple Minerals-Vitamins (CAL MAG ZINC +D3) TABS Take 1-2 tablets by mouth See admin instructions. Take 1 tablet in the morning and 2 tablets in the evening   OXYGEN 2lpm with sleep   Polyethyl Glycol-Propyl Glycol (SYSTANE OP) Place 1 drop into both eyes daily as needed (dry eyes).   Respiratory Therapy Supplies (FLUTTER) DEVI 1 Device by Does not apply route as needed.   valACYclovir (VALTREX) 1000 MG tablet Take 500 mg by mouth every other day.      Allergies:   Codeine, Amoxicillin, Latex, Levofloxacin, and Statins   Social History   Socioeconomic History   Marital status: Widowed    Spouse name: Not on file   Number of children: Not on file   Years of education: Not on file   Highest education level: Not on file  Occupational History   Occupation: caregiver  Tobacco Use   Smoking status: Former    Current packs/day: 0.00    Average packs/day: 0.5 packs/day for 25.0 years (12.5 ttl pk-yrs)    Types: Cigarettes    Start date: 05/16/1980    Quit date: 05/16/2005    Years since quitting: 17.3   Smokeless tobacco: Never  Vaping Use   Vaping status: Never Used  Substance and Sexual Activity   Alcohol use: Yes    Alcohol/week: 6.0 standard drinks of alcohol    Types: 6 Standard drinks or equivalent per week    Comment: social beer   Drug use: No   Sexual activity: Not Currently  Other Topics Concern   Not on file  Social History Narrative   Children   Lives alone   Denies caffeine use.   Social Determinants of Health   Financial Resource Strain: Not on file  Food Insecurity: Not on file  Transportation Needs: Not on file  Physical Activity: Not on file  Stress: Not on file  Social Connections: Not on file     Family History: The patient's family history includes Breast cancer  (age of onset: 45) in her mother; Emphysema in her maternal grandfather.  ROS:   Review of Systems  Constitution: Negative for decreased appetite, fever and weight gain.  HENT: Negative for congestion, ear discharge, hoarse voice and sore throat.   Eyes: Negative for discharge, redness, vision loss in right eye and visual halos.  Cardiovascular: Negative for chest pain, dyspnea on exertion, leg swelling, orthopnea and palpitations.  Respiratory: Negative for cough, hemoptysis, shortness of breath and snoring.   Endocrine: Negative for heat intolerance and polyphagia.  Hematologic/Lymphatic: Negative for bleeding problem. Does not bruise/bleed easily.  Skin: Negative for flushing, nail changes, rash and suspicious lesions.  Musculoskeletal: Negative for arthritis, joint pain, muscle cramps, myalgias, neck pain and stiffness.  Gastrointestinal: Negative for abdominal pain, bowel  incontinence, diarrhea and excessive appetite.  Genitourinary: Negative for decreased libido, genital sores and incomplete emptying.  Neurological: Negative for brief paralysis, focal weakness, headaches and loss of balance.  Psychiatric/Behavioral: Negative for altered mental status, depression and suicidal ideas.  Allergic/Immunologic: Negative for HIV exposure and persistent infections.    EKGs/Labs/Other Studies Reviewed:    The following studies were reviewed today:   EKG:  The ekg ordered today demonstrates sinus rhythm, with premature atrial complexes.  Recent Labs: 07/20/2022: ALT 16; BUN 13; Creatinine 0.62; Hemoglobin 11.6; Platelet Count 212; Potassium 4.1; Sodium 133  Recent Lipid Panel    Component Value Date/Time   CHOL 255 (H) 08/19/2021 1030   TRIG 129 08/19/2021 1030   HDL 68 08/19/2021 1030   CHOLHDL 3.8 08/19/2021 1030   CHOLHDL 6 12/14/2013 1103   VLDL 28.2 12/14/2013 1103   LDLCALC 164 (H) 08/19/2021 1030   LDLDIRECT 171 (H) 11/17/2019 0831   LDLDIRECT 162.1 02/16/2013 0745     Physical Exam:    VS:  BP 124/66   Pulse 74   Ht 5\' 5"  (1.651 m)   Wt 161 lb 3.2 oz (73.1 kg)   SpO2 96%   BMI 26.83 kg/m     Wt Readings from Last 3 Encounters:  09/08/22 161 lb 3.2 oz (73.1 kg)  07/23/22 157 lb 12.8 oz (71.6 kg)  06/11/22 149 lb 12.8 oz (67.9 kg)     GEN: Well nourished, well developed in no acute distress HEENT: Normal NECK: No JVD; No carotid bruits LYMPHATICS: No lymphadenopathy CARDIAC: S1S2 noted,RRR, no murmurs, rubs, gallops RESPIRATORY:  Clear to auscultation without rales, wheezing or rhonchi  ABDOMEN: Soft, non-tender, non-distended, +bowel sounds, no guarding. EXTREMITIES: No edema, No cyanosis, no clubbing MUSCULOSKELETAL:  No deformity  SKIN: Warm and dry NEUROLOGIC:  Alert and oriented x 3, non-focal PSYCHIATRIC:  Normal affect, good insight  ASSESSMENT:    1. Coronary artery disease involving native coronary artery of native heart without angina pectoris   2. Chronic diastolic CHF (congestive heart failure) (HCC)   3. Mixed hyperlipidemia    PLAN:    No anginal symptoms in the office today.   Spoke with patient again about primary prevention with lipid lowering agent. She is still not interested.    Clinically euvolemic.   The patient is in agreement with the above plan. The patient left the office in stable condition.  The patient will follow up in   Medication Adjustments/Labs and Tests Ordered: Current medicines are reviewed at length with the patient today.  Concerns regarding medicines are outlined above.  Orders Placed This Encounter  Procedures   EKG 12-Lead   No orders of the defined types were placed in this encounter.   Patient Instructions  Medication Instructions:  Ask your Insurance provider about these medications- please let us know what they say: Repatha Bempedoic Acid *If you need a refill on your cardiac medications before your next appointment, please call your pharmacy*  Follow-Up: At [facility], you and your health needs are our priority.  As part of our continuing mission to provide you with exceptional heart care, we have created designated Provider Care Teams.  These Care Teams include your primary Cardiologist (physician) and Advanced Practice Providers (APPs -  Physician Assistants and Nurse Practitioners) who all work together to provide you with the care you need, when you need it.  We recommend signing up for the patient portal called "MyChart".  Sign up information is provided on this  After Visit Summary.  MyChart is used to connect with patients for Virtual Visits (Telemedicine).  Patients are able to view lab/test results, encounter notes, upcoming appointments, etc.  Non-urgent messages can be sent to your provider as well.   To learn more about what you can do with MyChart, go to [email or web address].    Your next appointment:   1 year(s)  Provider:   Thomasene Ripple, DO       Adopting a Healthy Lifestyle.  Know what a healthy weight is for you (roughly BMI <25) and aim to maintain this   Aim for 7+ servings of fruits and vegetables daily   65-80+ fluid ounces of water or unsweet tea for healthy kidneys   Limit to max 1 drink of alcohol per day; avoid smoking/tobacco   Limit animal fats in diet for cholesterol and heart health - choose grass fed whenever available   Avoid highly processed foods, and foods high in saturated/trans fats   Aim for low stress - take time to unwind and care for your mental health   Aim for 150 min of moderate intensity exercise weekly for heart health, and weights twice weekly for bone health   Aim for 7-9 hours of sleep daily   When it comes to diets, agreement about the perfect plan isnt easy to find, even among the experts. Experts at the [facility] of [employer] developed an idea known as the Healthy Eating Plate. Just imagine a plate divided into logical, healthy portions.   The emphasis is on diet  quality:   Load up on vegetables and fruits - one-half of your plate: Aim for color and variety, and remember that potatoes dont count.   Go for whole grains - one-quarter of your plate: Whole wheat, barley, wheat berries, quinoa, oats, brown rice, and foods made with them. If you want pasta, go with whole wheat pasta.   Protein power - one-quarter of your plate: Fish, chicken, beans, and nuts are all healthy, versatile protein sources. Limit red meat.   The diet, however, does go beyond the plate, offering a few other suggestions.   Use healthy plant oils, such as olive, canola, soy, corn, sunflower and peanut. Check the labels, and avoid partially hydrogenated oil, which have unhealthy trans fats.   If youre thirsty, drink water. Coffee and tea are good in moderation, but skip sugary drinks and limit milk and dairy products to one or two daily servings.   The type of carbohydrate in the diet is more important than the amount. Some sources of carbohydrates, such as vegetables, fruits, whole grains, and beans-are healthier than others.   Finally, stay active  Signed, Thomasene Ripple, DO  09/12/2022 12:01 AM    [facility]

## 2022-09-08 NOTE — Patient Instructions (Signed)
Medication Instructions:  Ask your Insurance provider about these medications- please let us know what they say: Repatha Bempedoic Acid *If you need a refill on your cardiac medications before your next appointment, please call your pharmacy*  Follow-Up: At [facility], you and your health needs are our priority.  As part of our continuing mission to provide you with exceptional heart care, we have created designated Provider Care Teams.  These Care Teams include your primary Cardiologist (physician) and Advanced Practice Providers (APPs -  Physician Assistants and Nurse Practitioners) who all work together to provide you with the care you need, when you need it.  We recommend signing up for the patient portal called "MyChart".  Sign up information is provided on this After Visit Summary.  MyChart is used to connect with patients for Virtual Visits (Telemedicine).  Patients are able to view lab/test results, encounter notes, upcoming appointments, etc.  Non-urgent messages can be sent to your provider as well.   To learn more about what you can do with MyChart, go to [email or web address].    Your next appointment:   1 year(s)  Provider:   Thomasene Ripple, DO

## 2022-09-10 DIAGNOSIS — E7800 Pure hypercholesterolemia, unspecified: Secondary | ICD-10-CM | POA: Diagnosis not present

## 2022-09-10 DIAGNOSIS — I700 Atherosclerosis of aorta: Secondary | ICD-10-CM | POA: Diagnosis not present

## 2022-09-10 DIAGNOSIS — E538 Deficiency of other specified B group vitamins: Secondary | ICD-10-CM | POA: Diagnosis not present

## 2022-09-10 DIAGNOSIS — Z23 Encounter for immunization: Secondary | ICD-10-CM | POA: Diagnosis not present

## 2022-09-10 DIAGNOSIS — Z0000 Encounter for general adult medical examination without abnormal findings: Secondary | ICD-10-CM | POA: Diagnosis not present

## 2022-09-10 DIAGNOSIS — J449 Chronic obstructive pulmonary disease, unspecified: Secondary | ICD-10-CM | POA: Diagnosis not present

## 2022-09-10 DIAGNOSIS — J9611 Chronic respiratory failure with hypoxia: Secondary | ICD-10-CM | POA: Diagnosis not present

## 2022-09-10 DIAGNOSIS — I5032 Chronic diastolic (congestive) heart failure: Secondary | ICD-10-CM | POA: Diagnosis not present

## 2022-09-10 DIAGNOSIS — M8589 Other specified disorders of bone density and structure, multiple sites: Secondary | ICD-10-CM | POA: Diagnosis not present

## 2022-09-10 DIAGNOSIS — R7301 Impaired fasting glucose: Secondary | ICD-10-CM | POA: Diagnosis not present

## 2022-09-10 DIAGNOSIS — R413 Other amnesia: Secondary | ICD-10-CM | POA: Diagnosis not present

## 2022-09-10 DIAGNOSIS — C3491 Malignant neoplasm of unspecified part of right bronchus or lung: Secondary | ICD-10-CM | POA: Diagnosis not present

## 2022-09-29 ENCOUNTER — Other Ambulatory Visit: Payer: Self-pay | Admitting: Family Medicine

## 2022-09-29 DIAGNOSIS — Z1231 Encounter for screening mammogram for malignant neoplasm of breast: Secondary | ICD-10-CM

## 2022-10-08 ENCOUNTER — Ambulatory Visit: Payer: Medicare HMO

## 2022-10-08 ENCOUNTER — Ambulatory Visit: Payer: Medicare HMO | Admitting: Emergency Medicine

## 2022-10-08 ENCOUNTER — Encounter: Payer: Self-pay | Admitting: Emergency Medicine

## 2022-10-08 VITALS — BP 118/60 | HR 79 | Temp 97.8°F | Ht 65.0 in | Wt 161.4 lb

## 2022-10-08 DIAGNOSIS — J449 Chronic obstructive pulmonary disease, unspecified: Secondary | ICD-10-CM

## 2022-10-08 DIAGNOSIS — R059 Cough, unspecified: Secondary | ICD-10-CM | POA: Diagnosis not present

## 2022-10-08 DIAGNOSIS — R918 Other nonspecific abnormal finding of lung field: Secondary | ICD-10-CM | POA: Diagnosis not present

## 2022-10-08 MED ORDER — SPIRIVA RESPIMAT 2.5 MCG/ACT IN AERS: 2.0000 | INHALATION_SPRAY | Freq: Every day | RESPIRATORY_TRACT | 5 refills | Status: DC

## 2022-10-08 MED ORDER — PREDNISONE 10 MG PO TABS: ORAL_TABLET | ORAL | 0 refills | Status: DC

## 2022-10-08 MED ORDER — BREO ELLIPTA 100-25 MCG/ACT IN AEPB: 1.0000 | INHALATION_SPRAY | Freq: Every day | RESPIRATORY_TRACT | 12 refills | Status: DC

## 2022-10-08 NOTE — Progress Notes (Signed)
Subjective:    Patient ID: BAMMA ANGELO, female    DOB: August 23, 1945, 77 y.o.   MRN: [id number]  HPI  CT chest 04/21/2022 reviewed by me shows that her right upper lobe subsolid nodule is smaller, more dense consistent with posttreatment changes.  The adjacent opacities in the right upper lobe and the superior right lower lobe are improved, more confluent and linear consistent with postradiation/pneumonitis scar.  There are stable groundglass pulmonary nodules in the right lower lobe, right middle lobe.  ROV 06/11/2022 --77 year old woman with severe COPD, right upper lobe adenocarcinoma (09/2021) that was treated with XRT and complicated by apparent radiation pneumonitis.  I treated her with an extended course of prednisone.  Infiltrate improved significantly on imaging.  Prednisone has now been tapered off.  She is currently managed on Spiriva and Symbicort.  Oxygen at 3 L/min.  Today she reports that she has been doing fairly well. She not using much albuterol, not every day. Occasional am cough - clear mucous. No pred since our long taper. Due for CT chest in July.   Acute office visit 10/08/2022 --Ms. Mcnaught is a 77 year old woman with severe COPD, history of a right upper lobe adenocarcinoma treated with SBRT that was complicated by radiation pneumonitis.  We treated her with prolonged course of prednisone with improvement in her pulmonary infiltrate (now off).  She has hypoxemic respiratory failure. She has been well until about 2 weeks ago. She developed mucous, congestion, has cough w clear mucous in the am. No hemoptysis. Her daughter told her that she heard some wheezing. She is on Breztri, has been using samples for 2-3 months. She isn't as sure that it is doing as well as he prior symbicort and spiriva  CT chest 07/20/2022 reviewed by me, shows continued increased density of her right upper lobe nodule postradiation.  Unchanged 10 mm right mid lobe nodule, 8 mm right lower lobe nodule.  No new  nodules present.   Review of Systems As per HPI     Objective:   Physical Exam Vitals:   10/08/22 1437  BP: 118/60  Pulse: 79  Temp: 97.8 F (36.6 C)  TempSrc: Oral  SpO2: 94%  Weight: 161 lb 6.4 oz (73.2 kg)  Height: 5\' 5"  (1.651 m)    Gen: Pleasant, well-nourished, in no distress,  normal affect  ENT: No lesions,  mouth clear,  oropharynx clear, no postnasal drip  Neck: No JVD, no stridor  Lungs: No use of accessory muscles, no crackles or wheezing on normal respiration, no wheeze on forced expiration  Cardiovascular: RRR, heart sounds normal, no murmur or gallops, no peripheral edem  Musculoskeletal: No deformities, no cyanosis or clubbing  Neuro: alert, awake, non focal  Skin: Warm, no lesions or rash      Assessment & Plan:  DYSPNEA Acute shortness of breath.  She is not wheezing on exam today.  She does not have crackles.  I will get a chest x-ray today.  She believes that she was doing better when she was on the Symbicort and Spiriva.  I will change the Breztri back to this.  Give her 5 days of prednisone to see if she is having a mild flare in absence of wheezing.  Did discuss with her the importance of maintaining her oxygen with exertion   Levy Pupa, MD, PhD 10/08/2022, 4:24 PM [facility] [phone] or if no answer [phone]

## 2022-10-08 NOTE — Patient Instructions (Addendum)
Stop your Markus Daft We will restart Symbicort 2 puffs twice a day.  Rinse and gargle after using We will restart Spiriva Respimat 2 puffs once daily Please take prednisone 30 mg once daily for 5 days, then stop. Continue your oxygen with all exertion. Follow with APP in 1 month to assess your status Follow Dr. Delton Coombes in 6 months, sooner if you have problems.

## 2022-10-08 NOTE — Assessment & Plan Note (Signed)
Acute shortness of breath.  She is not wheezing on exam today.  She does not have crackles.  I will get a chest x-ray today.  She believes that she was doing better when she was on the Symbicort and Spiriva.  I will change the Breztri back to this.  Give her 5 days of prednisone to see if she is having a mild flare in absence of wheezing.  Did discuss with her the importance of maintaining her oxygen with exertion

## 2022-10-15 ENCOUNTER — Encounter: Payer: Self-pay | Admitting: Nurse Practitioner

## 2022-10-15 ENCOUNTER — Ambulatory Visit: Payer: Medicare HMO | Admitting: Nurse Practitioner

## 2022-10-15 ENCOUNTER — Other Ambulatory Visit: Payer: Self-pay

## 2022-10-15 VITALS — BP 146/72 | HR 66 | Ht 65.0 in | Wt 164.0 lb

## 2022-10-15 DIAGNOSIS — J449 Chronic obstructive pulmonary disease, unspecified: Secondary | ICD-10-CM

## 2022-10-15 NOTE — Progress Notes (Unsigned)
@Patient  ID: Joanne Rogers, female    DOB: 26-Aug-1945, 77 y.o.   MRN: [id number]  Chief Complaint  Patient presents with   COPD    Gold III    Referring provider: Shon Hale, *  HPI:   TEST/EVENTS:   Allergies  Allergen Reactions   Codeine Hives   Amoxicillin Nausea And Vomiting   Latex Hives and Rash   Levofloxacin Nausea Only   Statins Other (See Comments)    Muscle aches   Wound Dressing Adhesive Rash    Immunization History  Administered Date(s) Administered   Influenza Split 01/20/1998, 11/24/1999, 11/02/2000, 11/03/2001, 01/22/2003, 11/05/2005, 10/26/2008, 10/29/2010, 11/04/2011, 10/24/2012, 09/13/2017   Influenza Whole 10/12/2009   Influenza, High Dose Seasonal PF 09/30/2016, 10/07/2017, 10/01/2020, 09/10/2022   Influenza, Quadrivalent, Recombinant, Inj, Pf 10/29/2018   Influenza,inj,Quad PF,6+ Mos 09/24/2015, 10/18/2018, 09/26/2019   Influenza,inj,quad, With Preservative 10/13/2013, 11/13/2014   Influenza-Unspecified 08/18/2018, 10/29/2018, 09/12/2020, 10/17/2021   Moderna Sars-Covid-2 Vaccination 02/02/2019, 02/15/2019   PFIZER(Purple Top)SARS-COV-2 Vaccination 01/31/2019, 02/21/2019, 10/17/2019   Pfizer(Comirnaty)Fall Seasonal Vaccine 12 years and older 10/22/2021   Pneumococcal Conjugate-13 06/29/2013   Pneumococcal Polysaccharide-23 02/26/2009, 07/03/2010, 08/22/2019   Pneumococcal-Unspecified 08/22/2019   Td 04/11/1990, 10/11/2002   Tdap 05/19/2007, 08/17/2018   Zoster Recombinant(Shingrix) 11/27/2016   Zoster, Live 05/28/2011, 08/12/2016, 11/27/2016    Past Medical History:  Diagnosis Date   Arthritis    back, hands, neck   COPD (chronic obstructive pulmonary disease) (HCC)    Coronary artery disease 11/28/2019   CCTA 11/28/19: Mild non-obstructive CAD, myocardial bridge mid LAD (cardiologist Dr. Georgeanna Lea, 08/19/21)   DDD (degenerative disc disease), lumbar    Dyspnea    uses oxygen at home   H/O bronchitis    allergy related    Hypercholesterolemia    Oxygen dependent    Palpitations    "fluttering"   Pneumonia    hx of    Tobacco History: Social History   Tobacco Use  Smoking Status Former   Current packs/day: 0.00   Average packs/day: 0.5 packs/day for 25.0 years (12.5 ttl pk-yrs)   Types: Cigarettes   Start date: 05/16/1980   Quit date: 05/16/2005   Years since quitting: 17.4  Smokeless Tobacco Never   Counseling given: Not Answered   Outpatient Medications Prior to Visit  Medication Sig Dispense Refill   acetaminophen (TYLENOL) 500 MG tablet Take 1,000 mg by mouth every 6 (six) hours as needed for moderate pain.     albuterol (VENTOLIN HFA) 108 (90 Base) MCG/ACT inhaler Inhale 1-2 puffs into the lungs every 4 (four) hours as needed for wheezing or shortness of breath. 1 each 5   ascorbic acid (VITAMIN C) 500 MG tablet Take 500 mg by mouth daily.     aspirin 81 MG tablet Take 81 mg by mouth at bedtime.     azelastine (ASTELIN) 0.1 % nasal spray Place 1 spray into both nostrils as needed for rhinitis.     beta carotene w/minerals (OCUVITE) tablet Take 1 tablet by mouth at bedtime.     bisoprolol (ZEBETA) 5 MG tablet TAKE 1 TABLET EVERY DAY 90 tablet 3   Budeson-Glycopyrrol-Formoterol (BREZTRI AEROSPHERE) 160-9-4.8 MCG/ACT AERO Inhale 2 puffs into the lungs in the morning and at bedtime. 2 each 0   cetirizine (ZYRTEC) 10 MG tablet Take 10 mg by mouth at bedtime.     cholecalciferol (VITAMIN D3) 25 MCG (1000 UNIT) tablet Take 1,000 Units by mouth daily.     Cyanocobalamin (B-12) 2500 MCG TABS  Take 2,500 mcg by mouth daily.     famotidine (PEPCID) 20 MG tablet One at bedtime 30 tablet 11   fluticasone furoate-vilanterol (BREO ELLIPTA) 100-25 MCG/ACT AEPB Inhale 1 puff into the lungs daily. 180 each 12   ibuprofen (ADVIL) 200 MG tablet Take 400 mg by mouth every 6 (six) hours as needed for moderate pain.     ipratropium-albuterol (DUONEB) 0.5-2.5 (3) MG/3ML SOLN Take 3 mLs by nebulization every 4 (four)  hours. Dx: J44.9 360 mL 1   Multiple Minerals-Vitamins (CAL MAG ZINC +D3) TABS Take 1-2 tablets by mouth See admin instructions. Take 1 tablet in the morning and 2 tablets in the evening     OXYGEN 2lpm with sleep     Polyethyl Glycol-Propyl Glycol (SYSTANE OP) Place 1 drop into both eyes daily as needed (dry eyes).     Respiratory Therapy Supplies (FLUTTER) DEVI 1 Device by Does not apply route as needed. 1 each 0   Tiotropium Bromide Monohydrate (SPIRIVA RESPIMAT) 2.5 MCG/ACT AERS Inhale 2 puffs into the lungs daily. 4 g 5   valACYclovir (VALTREX) 1000 MG tablet Take 500 mg by mouth every other day.      predniSONE (DELTASONE) 10 MG tablet 30 mg once daily for 5 days then stop. 15 tablet 0   No facility-administered medications prior to visit.     Review of Systems:   Constitutional: No weight loss or gain, night sweats, fevers, chills, fatigue, or lassitude. HEENT: No headaches, difficulty swallowing, tooth/dental problems, or sore throat. No sneezing, itching, ear ache, nasal congestion, or post nasal drip CV:  No chest pain, orthopnea, PND, swelling in lower extremities, anasarca, dizziness, palpitations, syncope Resp: No shortness of breath with exertion or at rest. No excess mucus or change in color of mucus. No productive or non-productive. No hemoptysis. No wheezing.  No chest wall deformity GI:  No heartburn, indigestion, abdominal pain, nausea, vomiting, diarrhea, change in bowel habits, loss of appetite, bloody stools.  GU: No dysuria, change in color of urine, urgency or frequency.  No flank pain, no hematuria  Skin: No rash, lesions, ulcerations MSK:  No joint pain or swelling.  No decreased range of motion.  No back pain. Neuro: No dizziness or lightheadedness.  Psych: No depression or anxiety. Mood stable.     Physical Exam:  BP (!) 146/72   Pulse 66   Ht 5\' 5"  (1.651 m)   Wt 164 lb (74.4 kg)   SpO2 97%   BMI 27.29 kg/m   GEN: Pleasant, interactive,  well-nourished/chronically-ill appearing/acutely-ill appearing/poorly-nourished/morbidly obese; in no acute distress.****** HEENT:  Normocephalic and atraumatic. EACs patent bilaterally. TM pearly gray with present light reflex bilaterally. PERRLA. Sclera white. Nasal turbinates pink, moist and patent bilaterally. No rhinorrhea present. Oropharynx pink and moist, without exudate or edema. No lesions, ulcerations, or postnasal drip.  NECK:  Supple w/ fair ROM. No JVD present. Normal carotid impulses w/o bruits. Thyroid symmetrical with no goiter or nodules palpated. No lymphadenopathy.   CV: RRR, no m/r/g, no peripheral edema. Pulses intact, +2 bilaterally. No cyanosis, pallor or clubbing. PULMONARY:  Unlabored, regular breathing. Clear bilaterally A&P w/o wheezes/rales/rhonchi. No accessory muscle use.  GI: BS present and normoactive. Soft, non-tender to palpation. No organomegaly or masses detected. No CVA tenderness. MSK: No erythema, warmth or tenderness. Cap refil <2 sec all extrem. No deformities or joint swelling noted.  Neuro: A/Ox3. No focal deficits noted.   Skin: Warm, no lesions or rashe Psych: Normal affect and behavior. Judgement  and thought content appropriate.     Lab Results:  CBC    Component Value Date/Time   WBC 6.5 07/20/2022 1526   WBC 7.7 09/29/2021 0855   RBC 3.50 (L) 07/20/2022 1526   HGB 11.6 (L) 07/20/2022 1526   HCT 35.2 (L) 07/20/2022 1526   PLT 212 07/20/2022 1526   MCV 100.6 (H) 07/20/2022 1526   MCH 33.1 07/20/2022 1526   MCHC 33.0 07/20/2022 1526   RDW 12.2 07/20/2022 1526   LYMPHSABS 1.0 07/20/2022 1526   MONOABS 0.6 07/20/2022 1526   EOSABS 0.2 07/20/2022 1526   BASOSABS 0.0 07/20/2022 1526    BMET    Component Value Date/Time   NA 133 (L) 07/20/2022 1526   NA 137 05/08/2020 0909   K 4.1 07/20/2022 1526   CL 97 (L) 07/20/2022 1526   CO2 30 07/20/2022 1526   GLUCOSE 110 (H) 07/20/2022 1526   BUN 13 07/20/2022 1526   BUN 8 05/08/2020 0909    CREATININE 0.62 07/20/2022 1526   CALCIUM 9.7 07/20/2022 1526   GFRNONAA >60 07/20/2022 1526   GFRAA 101 11/14/2019 0000    BNP No results found for: "BNP"   Imaging:  No results found.  Administration History     None          Latest Ref Rng & Units 10/13/2021    9:42 AM 07/22/2015   10:51 AM  PFT Results  FVC-Pre L 1.63  P 1.90   FVC-Predicted Pre % 55  P 60   FVC-Post L 1.72  P 2.04   FVC-Predicted Post % 58  P 64   Pre FEV1/FVC % % 57  P 53   Post FEV1/FCV % % 51  P 58   FEV1-Pre L 0.93  P 1.01   FEV1-Predicted Pre % 42  P 42   FEV1-Post L 0.87  P 1.18   DLCO uncorrected ml/min/mmHg 14.84  P 11.82   DLCO UNC% % 73  P 43   DLCO corrected ml/min/mmHg 14.93  P   DLCO COR %Predicted % 73  P   DLVA Predicted % 100  P 76   TLC L 5.47  P 5.02   TLC % Predicted % 102  P 93   RV % Predicted % 148  P 130     P Preliminary result    No results found for: "NITRICOXIDE"      Assessment & Plan:   No problem-specific Assessment & Plan notes found for this encounter.   I spent *** minutes of dedicated to the care of this patient on the date of this encounter to include pre-visit review of records, face-to-face time with the patient discussing conditions above, post visit ordering of testing, clinical documentation with the electronic health record, making appropriate referrals as documented, and communicating necessary findings to members of the patients care team.  Noemi Chapel, NP 10/15/2022  Pt aware and understands NP's role.

## 2022-10-20 ENCOUNTER — Ambulatory Visit
Admission: RE | Admit: 2022-10-20 | Discharge: 2022-10-20 | Disposition: A | Discharge status: Home / Self Care | Payer: Medicare HMO | Source: Ambulatory Visit | Attending: Family Medicine | Admitting: Family Medicine

## 2022-10-20 DIAGNOSIS — Z1231 Encounter for screening mammogram for malignant neoplasm of breast: Secondary | ICD-10-CM

## 2022-10-26 ENCOUNTER — Telehealth: Payer: Self-pay | Admitting: Emergency Medicine

## 2022-10-26 NOTE — Telephone Encounter (Signed)
Patient states needs refill for Breztri. Needs 3 month supply. Pharmacy is [facility] mail order. Patient phone number is [phone].

## 2022-10-29 NOTE — Telephone Encounter (Signed)
Last visit Joanne Rogers was d/c'ed Need to speak with the pt  Called pt and there was no answer- LMTCB

## 2022-11-26 ENCOUNTER — Encounter: Payer: Self-pay | Admitting: Emergency Medicine

## 2022-11-26 ENCOUNTER — Ambulatory Visit: Payer: Medicare HMO | Admitting: Emergency Medicine

## 2022-11-26 VITALS — BP 124/71 | HR 71 | Temp 98.1°F | Ht 65.5 in | Wt 166.4 lb

## 2022-11-26 DIAGNOSIS — J9611 Chronic respiratory failure with hypoxia: Secondary | ICD-10-CM

## 2022-11-26 DIAGNOSIS — R911 Solitary pulmonary nodule: Secondary | ICD-10-CM

## 2022-11-26 DIAGNOSIS — J449 Chronic obstructive pulmonary disease, unspecified: Secondary | ICD-10-CM

## 2022-11-26 DIAGNOSIS — J9612 Chronic respiratory failure with hypercapnia: Secondary | ICD-10-CM | POA: Diagnosis not present

## 2022-11-26 NOTE — Patient Instructions (Addendum)
Please start taking your Breztri 2 puffs twice a day.  Rinse and gargle after using. Keep albuterol available to use 2 puffs when individuals breath, chest tightness, wheezing. Continue oxygen 3 L/min We will repeat your CT scan of the chest in January to compare with your priors. Follow Dr. Delton Coombes in January after your CT so we can review those results together

## 2022-11-26 NOTE — Progress Notes (Signed)
Subjective:    Patient ID: Joanne Rogers, female    DOB: [birth year], 77 y.o.   MRN: [id number]  HPI  CT chest 04/21/2022 reviewed by me shows that her right upper lobe subsolid nodule is smaller, more dense consistent with posttreatment changes.  The adjacent opacities in the right upper lobe and the superior right lower lobe are improved, more confluent and linear consistent with postradiation/pneumonitis scar.  There are stable groundglass pulmonary nodules in the right lower lobe, right middle lobe.  ROV 06/11/2022 --77 year old woman with severe COPD, right upper lobe adenocarcinoma (09/2021) that was treated with XRT and complicated by apparent radiation pneumonitis.  I treated her with an extended course of prednisone.  Infiltrate improved significantly on imaging.  Prednisone has now been tapered off.  She is currently managed on Spiriva and Symbicort.  Oxygen at 3 L/min.  Today she reports that she has been doing fairly well. She not using much albuterol, not every day. Occasional am cough - clear mucous. No pred since our long taper. Due for CT chest in July.   Acute office visit 10/08/2022 --Joanne Rogers is a 77 year old woman with severe COPD, history of a right upper lobe adenocarcinoma treated with SBRT that was complicated by radiation pneumonitis.  We treated her with prolonged course of prednisone with improvement in her pulmonary infiltrate (now off).  She has hypoxemic respiratory failure. She has been well until about 2 weeks ago. She developed mucous, congestion, has cough w clear mucous in the am. No hemoptysis. Her daughter told her that she heard some wheezing. She is on Breztri, has been using samples for 2-3 months. She isn't as sure that it is doing as well as he prior symbicort and spiriva  CT chest 07/20/2022 reviewed by me, shows continued increased density of her right upper lobe nodule postradiation.  Unchanged 10 mm right mid lobe nodule, 8 mm right lower lobe nodule.  No new  nodules present.  ROV 11/26/2022 --follow-up visit 77 year old woman with history of severe COPD and right upper lobe adenocarcinoma that was treated with SBRT and complicated by radiation pneumonitis.  She completed a long course of prednisone with improvement in her pulmonary infiltrate.  Currently managed on Breztri. She feels a bit better, is wearing her O2 reliably at 3L/min. She has some cough and mucous in the am when she gets up. She is on breztri but is only taking qd   Review of Systems As per HPI     Objective:   Physical Exam Vitals:   11/26/22 1153  BP: 124/71  Pulse: 71  Temp: 98.1 F (36.7 C)  TempSrc: Oral  SpO2: 96%  Weight: 166 lb 6.4 oz (75.5 kg)  Height: 5' 5.5" (1.664 m)    Gen: Pleasant, well-nourished, in no distress,  normal affect  ENT: No lesions,  mouth clear,  oropharynx clear, no postnasal drip  Neck: No JVD, no stridor  Lungs: No use of accessory muscles, no crackles or wheezing on normal respiration, no wheeze on forced expiration  Cardiovascular: RRR, heart sounds normal, no murmur or gallops, no peripheral edem  Musculoskeletal: No deformities, no cyanosis or clubbing  Neuro: alert, awake, non focal  Skin: Warm, no lesions or rash      Assessment & Plan:  Pulmonary nodule Stable pulm nodules and no evidence recurrence on her CT chest 07/2022. Plan surveillance CT after 6 months to follow nodules and also pneumonitis (significantly improved)  COPD GOLD III Has only been using her  breztri every day, need to increase to bid. Script sent. Plan continue as long as adequate insurance coverage. If not then change back to spiriva and symbicort  Chronic respiratory failure with hypoxia and hypercapnia (HCC) Continue oxygen 3 L/min    Levy Pupa, MD, PhD 11/27/2022, 2:35 PM [facility] [phone] or if no answer [phone]

## 2022-11-27 NOTE — Assessment & Plan Note (Signed)
Continue oxygen 3 L/min

## 2022-11-27 NOTE — Assessment & Plan Note (Signed)
Stable pulm nodules and no evidence recurrence on her CT chest 07/2022. Plan surveillance CT after 6 months to follow nodules and also pneumonitis (significantly improved)

## 2022-11-27 NOTE — Assessment & Plan Note (Signed)
Has only been using her breztri every day, need to increase to bid. Script sent. Plan continue as long as adequate insurance coverage. If not then change back to spiriva and symbicort

## 2022-12-16 DIAGNOSIS — C3491 Malignant neoplasm of unspecified part of right bronchus or lung: Secondary | ICD-10-CM | POA: Diagnosis not present

## 2022-12-16 DIAGNOSIS — Z9981 Dependence on supplemental oxygen: Secondary | ICD-10-CM | POA: Diagnosis not present

## 2022-12-16 DIAGNOSIS — J449 Chronic obstructive pulmonary disease, unspecified: Secondary | ICD-10-CM | POA: Diagnosis not present

## 2022-12-16 DIAGNOSIS — I509 Heart failure, unspecified: Secondary | ICD-10-CM | POA: Diagnosis not present

## 2022-12-16 DIAGNOSIS — R413 Other amnesia: Secondary | ICD-10-CM | POA: Diagnosis not present

## 2023-01-15 ENCOUNTER — Ambulatory Visit (HOSPITAL_COMMUNITY)
Admission: RE | Admit: 2023-01-15 | Discharge: 2023-01-15 | Disposition: A | Discharge status: Home / Self Care | Payer: Medicare HMO | Source: Ambulatory Visit | Attending: Internal Medicine | Admitting: Internal Medicine

## 2023-01-15 ENCOUNTER — Inpatient Hospital Stay: Payer: Medicare HMO | Attending: Internal Medicine

## 2023-01-15 DIAGNOSIS — I700 Atherosclerosis of aorta: Secondary | ICD-10-CM | POA: Diagnosis not present

## 2023-01-15 DIAGNOSIS — C3490 Malignant neoplasm of unspecified part of unspecified bronchus or lung: Secondary | ICD-10-CM | POA: Insufficient documentation

## 2023-01-15 DIAGNOSIS — R4189 Other symptoms and signs involving cognitive functions and awareness: Secondary | ICD-10-CM | POA: Insufficient documentation

## 2023-01-15 DIAGNOSIS — C3411 Malignant neoplasm of upper lobe, right bronchus or lung: Secondary | ICD-10-CM | POA: Insufficient documentation

## 2023-01-15 DIAGNOSIS — Z9221 Personal history of antineoplastic chemotherapy: Secondary | ICD-10-CM | POA: Diagnosis not present

## 2023-01-15 DIAGNOSIS — J441 Chronic obstructive pulmonary disease with (acute) exacerbation: Secondary | ICD-10-CM | POA: Diagnosis not present

## 2023-01-15 DIAGNOSIS — Z923 Personal history of irradiation: Secondary | ICD-10-CM | POA: Diagnosis not present

## 2023-01-15 DIAGNOSIS — J984 Other disorders of lung: Secondary | ICD-10-CM | POA: Diagnosis not present

## 2023-01-15 LAB — CBC WITH DIFFERENTIAL (CANCER CENTER ONLY)
Abs Immature Granulocytes: 0.02 10*3/uL (ref 0.00–0.07)
Basophils Absolute: 0 10*3/uL (ref 0.0–0.1)
Basophils Relative: 0 %
Eosinophils Absolute: 0.2 10*3/uL (ref 0.0–0.5)
Eosinophils Relative: 2 %
HCT: 33.4 % — ABNORMAL LOW (ref 36.0–46.0)
Hemoglobin: 10.9 g/dL — ABNORMAL LOW (ref 12.0–15.0)
Immature Granulocytes: 0 %
Lymphocytes Relative: 17 %
Lymphs Abs: 1.2 10*3/uL (ref 0.7–4.0)
MCH: 33 pg (ref 26.0–34.0)
MCHC: 32.6 g/dL (ref 30.0–36.0)
MCV: 101.2 fL — ABNORMAL HIGH (ref 80.0–100.0)
Monocytes Absolute: 0.7 10*3/uL (ref 0.1–1.0)
Monocytes Relative: 10 %
Neutro Abs: 5.2 10*3/uL (ref 1.7–7.7)
Neutrophils Relative %: 71 %
Platelet Count: 207 10*3/uL (ref 150–400)
RBC: 3.3 MIL/uL — ABNORMAL LOW (ref 3.87–5.11)
RDW: 11.8 % (ref 11.5–15.5)
WBC Count: 7.4 10*3/uL (ref 4.0–10.5)
nRBC: 0 % (ref 0.0–0.2)

## 2023-01-15 LAB — CMP (CANCER CENTER ONLY)
ALT: 24 U/L (ref 0–44)
AST: 20 U/L (ref 15–41)
Albumin: 4 g/dL (ref 3.5–5.0)
Alkaline Phosphatase: 81 U/L (ref 38–126)
Anion gap: 6 (ref 5–15)
BUN: 11 mg/dL (ref 8–23)
CO2: 34 mmol/L — ABNORMAL HIGH (ref 22–32)
Calcium: 9.4 mg/dL (ref 8.9–10.3)
Chloride: 98 mmol/L (ref 98–111)
Creatinine: 0.45 mg/dL (ref 0.44–1.00)
GFR, Estimated: >60 mL/min (ref >60)
Glucose, Bld: 110 mg/dL — ABNORMAL HIGH (ref 70–99)
Potassium: 4 mmol/L (ref 3.5–5.1)
Sodium: 138 mmol/L (ref 135–145)
Total Bilirubin: 0.3 mg/dL (ref 0.0–1.2)
Total Protein: 6.8 g/dL (ref 6.5–8.1)

## 2023-01-15 MED ORDER — IOHEXOL 300 MG/ML  SOLN
75.0000 mL | Freq: Once | INTRAMUSCULAR | Status: AC | PRN
  Administered 2023-01-15: 75 mL via INTRAVENOUS

## 2023-01-19 ENCOUNTER — Inpatient Hospital Stay (HOSPITAL_BASED_OUTPATIENT_CLINIC_OR_DEPARTMENT_OTHER): Payer: Medicare HMO | Admitting: Internal Medicine

## 2023-01-19 VITALS — BP 141/46 | HR 79 | Temp 98.2°F | Resp 16 | Ht 65.5 in | Wt 169.9 lb

## 2023-01-19 DIAGNOSIS — C3411 Malignant neoplasm of upper lobe, right bronchus or lung: Secondary | ICD-10-CM | POA: Diagnosis not present

## 2023-01-19 DIAGNOSIS — I700 Atherosclerosis of aorta: Secondary | ICD-10-CM | POA: Diagnosis not present

## 2023-01-19 DIAGNOSIS — Z9221 Personal history of antineoplastic chemotherapy: Secondary | ICD-10-CM | POA: Diagnosis not present

## 2023-01-19 DIAGNOSIS — C3490 Malignant neoplasm of unspecified part of unspecified bronchus or lung: Secondary | ICD-10-CM

## 2023-01-19 DIAGNOSIS — J441 Chronic obstructive pulmonary disease with (acute) exacerbation: Secondary | ICD-10-CM | POA: Diagnosis not present

## 2023-01-19 DIAGNOSIS — R4189 Other symptoms and signs involving cognitive functions and awareness: Secondary | ICD-10-CM | POA: Diagnosis not present

## 2023-01-19 DIAGNOSIS — Z923 Personal history of irradiation: Secondary | ICD-10-CM | POA: Diagnosis not present

## 2023-01-19 NOTE — Progress Notes (Signed)
 [facility] Telephone:([phone]) [phone]   [phone]) [phone]  OFFICE PROGRESS NOTE  Chrystal Lamarr RAMAN, MD [address] [place] [address]  DIAGNOSIS: Unresectable stage IIb (T1c, No/N1, M0) non-small cell lung cancer, adenocarcinoma.  The patient presented with right upper lobe groundglass lesion and hypermetabolic right hilar lymph node.  She was diagnosed in September 2023.   PRIOR THERAPY: Concurrent chemoradiation with weekly carboplatin  for AUC of 2 and paclitaxel  45 Mg/M2.  First dose October 27, 2021.  Status post 7 cycles.  Last dose was given in December 08, 2021  CURRENT THERAPY: Observation  INTERVAL HISTORY: Joanne Rogers 78 y.o. female returns to the clinic today for follow-up visit accompanied by family member and her daughter Stephane was available by phone during the visit.Discussed the use of AI scribe software for clinical note transcription with the patient, who gave verbal consent to proceed.  History of Present Illness   The patient, a 78 year old individual diagnosed with unresectable stage 2B non-small cell lung cancer (adenocarcinoma) in September 2023, underwent a seven-week course of chemotherapy and radiation. The treatment was challenging for the patient, but she reported some improvement post-treatment.  Since the last visit six months ago, the patient reported feeling pretty good with no new complaints other than persistent shortness of breath and foggy brain. The patient is on home oxygen  for the breathing issue. There were no reports of headaches, changes in vision, chest pain, or hemoptysis. The patient denied any weight loss in the past six months; instead, she gained four pounds.  The patient also has a history of COPD, which contributes to her breathing difficulties. The patient's recent CT scan showed no growth or changes from the previous scan six months ago. Some areas on the scan showed a decrease, but it was not  considered significant. The patient is under the care of a pulmonologist for her breathing issues, which were exacerbated by the radiation treatment. The radiation left a scar in the area where the tumor was, causing some issues with breathing.         MEDICAL HISTORY: Past Medical History:  Diagnosis Date   Arthritis    back, hands, neck   COPD (chronic obstructive pulmonary disease) (HCC)    Coronary artery disease 11/28/2019   CCTA 11/28/19: Mild non-obstructive CAD, myocardial bridge mid LAD (cardiologist Dr. Cordell Huntsman, 08/19/21)   DDD (degenerative disc disease), lumbar    Dyspnea    uses oxygen  at home   H/O bronchitis    allergy related   Hypercholesterolemia    Oxygen  dependent    Palpitations    fluttering   Pneumonia    hx of    ALLERGIES:  is allergic to codeine, amoxicillin , latex, levofloxacin, statins, and wound dressing adhesive.  MEDICATIONS:  Current Outpatient Medications  Medication Sig Dispense Refill   acetaminophen  (TYLENOL ) 500 MG tablet Take 1,000 mg by mouth every 6 (six) hours as needed for moderate pain.     albuterol  (VENTOLIN  HFA) 108 (90 Base) MCG/ACT inhaler Inhale 1-2 puffs into the lungs every 4 (four) hours as needed for wheezing or shortness of breath. 1 each 5   ascorbic acid (VITAMIN C) 500 MG tablet Take 500 mg by mouth daily.     aspirin 81 MG tablet Take 81 mg by mouth at bedtime.     azelastine (ASTELIN) 0.1 % nasal spray Place 1 spray into both nostrils as needed for rhinitis.     beta carotene w/minerals (  OCUVITE) tablet Take 1 tablet by mouth at bedtime.     bisoprolol  (ZEBETA ) 5 MG tablet TAKE 1 TABLET EVERY DAY 90 tablet 3   cetirizine  (ZYRTEC ) 10 MG tablet Take 10 mg by mouth at bedtime.     cholecalciferol (VITAMIN D3) 25 MCG (1000 UNIT) tablet Take 1,000 Units by mouth daily.     Cyanocobalamin  (B-12) 2500 MCG TABS Take 2,500 mcg by mouth daily.     famotidine  (PEPCID ) 20 MG tablet One at bedtime 30 tablet 11   fluticasone  furoate-vilanterol (BREO ELLIPTA ) 100-25 MCG/ACT AEPB Inhale 1 puff into the lungs daily. 180 each 12   ibuprofen (ADVIL) 200 MG tablet Take 400 mg by mouth every 6 (six) hours as needed for moderate pain.     ipratropium-albuterol  (DUONEB) 0.5-2.5 (3) MG/3ML SOLN Take 3 mLs by nebulization every 4 (four) hours. Dx: J44.9 360 mL 1   Multiple Minerals-Vitamins (CAL MAG ZINC  +D3) TABS Take 1-2 tablets by mouth See admin instructions. Take 1 tablet in the morning and 2 tablets in the evening     OXYGEN  2lpm with sleep     Polyethyl Glycol-Propyl Glycol (SYSTANE OP) Place 1 drop into both eyes daily as needed (dry eyes).     Respiratory Therapy Supplies (FLUTTER) DEVI 1 Device by Does not apply route as needed. 1 each 0   Tiotropium Bromide  Monohydrate (SPIRIVA  RESPIMAT) 2.5 MCG/ACT AERS Inhale 2 puffs into the lungs daily. 4 g 5   valACYclovir  (VALTREX ) 1000 MG tablet Take 500 mg by mouth every other day.      No current facility-administered medications for this visit.    SURGICAL HISTORY:  Past Surgical History:  Procedure Laterality Date   BRONCHIAL BIOPSY  09/29/2021   Procedure: BRONCHIAL BIOPSIES;  Surgeon: Shelah Lamar RAMAN, MD;  Location: [facility];  Service: Pulmonary;;   BRONCHIAL BRUSHINGS  09/29/2021   Procedure: BRONCHIAL BRUSHINGS;  Surgeon: Shelah Lamar RAMAN, MD;  Location: [facility] ENDOSCOPY;  Service: Pulmonary;;   BRONCHIAL NEEDLE ASPIRATION BIOPSY  09/29/2021   Procedure: BRONCHIAL NEEDLE ASPIRATION BIOPSIES;  Surgeon: Shelah Lamar RAMAN, MD;  Location: [facility] ENDOSCOPY;  Service: Pulmonary;;   CARDIOVASCULAR STRESS TEST  11/16/2005   EF 70%, NO ISCHEMIA   CARPAL TUNNEL RELEASE Right ~1988   DILATION AND CURETTAGE OF UTERUS     x3: x2 for miscarrages and x1 for polyp   HEMI-MICRODISCECTOMY LUMBAR LAMINECTOMY LEVEL 1 Left 07/20/2014   Procedure: centeral decompression,L4-L5, HEMI-LAMINECTOMY MICRODISCECTOMY L4 - L5 ON THE LEFT  LEVEL 1, forimatomy 4-5 root on the left;  Surgeon: Tanda Heading, MD;  Location: [facility];  Service: Orthopedics;  Laterality: Left;   US  ECHOCARDIOGRAPHY  11/19/2005   EF 55-60%   VIDEO BRONCHOSCOPY WITH ENDOBRONCHIAL ULTRASOUND Right 09/29/2021   Procedure: VIDEO BRONCHOSCOPY WITH ENDOBRONCHIAL ULTRASOUND;  Surgeon: Shelah Lamar RAMAN, MD;  Location: [facility]COPY;  Service: Pulmonary;  Laterality: Right;   VIDEO BRONCHOSCOPY WITH RADIAL ENDOBRONCHIAL ULTRASOUND  09/29/2021   Procedure: VIDEO BRONCHOSCOPY WITH RADIAL ENDOBRONCHIAL ULTRASOUND;  Surgeon: Shelah Lamar RAMAN, MD;  Location: [facility] ENDOSCOPY;  Service: Pulmonary;;    REVIEW OF SYSTEMS:  Constitutional: positive for fatigue Eyes: negative Ears, nose, mouth, throat, and face: negative Respiratory: positive for cough and dyspnea on exertion Cardiovascular: negative Gastrointestinal: negative Genitourinary:negative Integument/breast: negative Hematologic/lymphatic: negative Musculoskeletal:negative Neurological: negative Behavioral/Psych: negative Endocrine: negative Allergic/Immunologic: negative   PHYSICAL EXAMINATION: General appearance: alert, cooperative, fatigued, and no distress Head: Normocephalic, without obvious abnormality, atraumatic Neck: no adenopathy, no JVD, supple, symmetrical, trachea  midline, and thyroid  not enlarged, symmetric, no tenderness/mass/nodules Lymph nodes: Cervical, supraclavicular, and axillary nodes normal. Resp: clear to auscultation bilaterally Back: symmetric, no curvature. ROM normal. No CVA tenderness. Cardio: regular rate and rhythm, S1, S2 normal, no murmur, click, rub or gallop GI: soft, non-tender; bowel sounds normal; no masses,  no organomegaly Extremities: extremities normal, atraumatic, no cyanosis or edema Neurologic: Alert and oriented X 3, normal strength and tone. Normal symmetric reflexes. Normal coordination and gait  ECOG PERFORMANCE STATUS: 1 - Symptomatic but completely ambulatory  Blood pressure (!) 141/46, pulse 79, temperature 98.2 F  (36.8 C), temperature source Temporal, resp. rate 16, height 5' 5.5 (1.664 m), weight 169 lb 14.4 oz (77.1 kg), SpO2 100%.  LABORATORY DATA: Lab Results  Component Value Date   WBC 7.4 01/15/2023   HGB 10.9 (L) 01/15/2023   HCT 33.4 (L) 01/15/2023   MCV 101.2 (H) 01/15/2023   PLT 207 01/15/2023      Chemistry      Component Value Date/Time   NA 138 01/15/2023 1557   NA 137 05/08/2020 0909   K 4.0 01/15/2023 1557   CL 98 01/15/2023 1557   CO2 34 (H) 01/15/2023 1557   BUN 11 01/15/2023 1557   BUN 8 05/08/2020 0909   CREATININE 0.45 01/15/2023 1557      Component Value Date/Time   CALCIUM 9.4 01/15/2023 1557   ALKPHOS 81 01/15/2023 1557   AST 20 01/15/2023 1557   ALT 24 01/15/2023 1557   BILITOT 0.3 01/15/2023 1557       RADIOGRAPHIC STUDIES: CT Chest W Contrast Result Date: 01/19/2023 CLINICAL DATA:  Malignant non-small-cell lung cancer staging. * Tracking Code: BO * EXAM: CT CHEST WITH CONTRAST TECHNIQUE: Multidetector CT imaging of the chest was performed during intravenous contrast administration. RADIATION DOSE REDUCTION: This exam was performed according to the departmental dose-optimization program which includes automated exposure control, adjustment of the mA and/or kV according to patient size and/or use of iterative reconstruction technique. CONTRAST:  75mL OMNIPAQUE  IOHEXOL  300 MG/ML  SOLN COMPARISON:  CT 07/20/2022 and older. FINDINGS: Cardiovascular: Heart is nonenlarged. No pericardial effusion. Coronary artery calcifications are seen. Thoracic aorta has a normal course and caliber with some scattered calcified plaque. Plaque also seen extending along the great vessels. There is a enlargement of the pulmonary arteries. Please correlate for evidence of pulmonary artery hypertension. This has seen previously as well. Mediastinum/Nodes: Slightly heterogeneous thyroid  gland with some cystic nodules, unchanged from previous. Please correlate with prior thyroid  ultrasound  of 07/28/2022. No specific abnormal lymph node enlargement identified in the axillary regions, hilum or mediastinum. Lungs/Pleura: Stable dependent atelectasis and scarring along the lingula. Left lung is without consolidation or pneumothorax. Mild areas of bronchiectasis. Right lung again has perihilar patchy opacity with distortion, scarring and atelectatic changes. Some nodularity. Example right upper lobe area which previously measured 16 x 10 mm, today on series 6, image 60 measures 14 by 10 mm. 10 mm focus in the middle lobe of the ground-glass nodule, today is measured on series 6, image 78 at 7 mm. Smaller areas of ground-glass in the right lower lobe on the prior is less well defined today. Extent and distribution is similar to the previous examination. There are areas along the right upper lobe as well as in the superior segment of the right lower lobe. No pleural effusion or pneumothorax. Upper Abdomen: No acute abnormality. Stable small hepatic cystic foci. Musculoskeletal: No chest wall abnormality. No acute or significant osseous findings. Scattered  degenerative changes. IMPRESSION: Stable nodular perihilar changes in the right lung with some distortion. Other areas of subtle ground-glass appears similar to slightly decreased today. Recommend continued follow up no new dominant mass lesion, pleural effusion or lymph node enlargement. Aortic Atherosclerosis (ICD10-I70.0). Electronically Signed   By: Ranell Bring M.D.   On: 01/19/2023 13:26    ASSESSMENT AND PLAN: This is a very pleasant 78 years old white female with Unresectable stage IIb (T1c, No/N1, M0) non-small cell lung cancer, adenocarcinoma.  The patient presented with right upper lobe groundglass lesion and hypermetabolic right hilar lymph node.  She was diagnosed in September 2023. The patient underwent a course of concurrent chemoradiation with weekly carboplatin  for AUC of 2 and paclitaxel  45 Mg/M2 status post 7 cycle. She has a rough  time tolerating the previous treatment with increasing fatigue and weakness. He is currently on observation and feeling fine except for the baseline shortness of breath. The patient had repeat CT scan of the chest performed recently.  I personally and independently reviewed the scan and discussed the result with the patient and her daughter.  Her scan showed no concerning findings for disease progression.    Stage IIB Non-Small Cell Lung Cancer (NSCLC) Diagnosed in September 2023 with unresectable stage IIB NSCLC, specifically adenocarcinoma. Completed seven weeks of chemotherapy and radiation, resulting in lung tissue scarring. Currently under observation with no new growths or significant changes since the last CT scan in July 2024. Reports shortness of breath and cognitive impairment. - Continue observation every six months - Perform CT scan and lab work one week before each follow-up visit - Maintain current routine until five years of stability  Chronic Obstructive Pulmonary Disease (COPD) COPD exacerbated by radiation treatment for lung cancer. On home oxygen  therapy. Pulmonologist Dr. Ruther manages COPD and may adjust treatment with steroids as needed. - Continue home oxygen  therapy - Consult with pulmonologist Dr. Shelah for management and potential steroid use  Cognitive Impairment Experiencing cognitive impairment without headaches, vision changes, or chest pain. Potential side effect of cancer treatment or other underlying conditions. - Monitor cognitive symptoms - Discuss with primary care physician if symptoms persist or worsen  General Health Maintenance Gained four pounds in the last six months. No significant weight loss or other concerning symptoms. - Encourage balanced diet and regular physical activity as tolerated  Follow-up - Schedule follow-up visit in six months - Perform CT scan and lab work one week prior to the next visit.   The patient was advised to call  immediately if she has any other concerning symptoms in the interval. The patient voices understanding of current disease status and treatment options and is in agreement with the current care plan.  All questions were answered. The patient knows to call the clinic with any problems, questions or concerns. We can certainly see the patient much sooner if necessary.  The total time spent in the appointment was 30 minutes.  Disclaimer: This note was dictated with voice recognition software. Similar sounding words can inadvertently be transcribed and may not be corrected upon review.

## 2023-01-21 ENCOUNTER — Other Ambulatory Visit: Payer: Medicare HMO

## 2023-02-04 ENCOUNTER — Ambulatory Visit: Payer: Medicare HMO | Admitting: Emergency Medicine

## 2023-02-04 ENCOUNTER — Encounter: Payer: Self-pay | Admitting: Emergency Medicine

## 2023-02-04 VITALS — BP 137/71 | HR 82 | Ht 65.5 in | Wt 169.0 lb

## 2023-02-04 DIAGNOSIS — J9611 Chronic respiratory failure with hypoxia: Secondary | ICD-10-CM | POA: Diagnosis not present

## 2023-02-04 DIAGNOSIS — J700 Acute pulmonary manifestations due to radiation: Secondary | ICD-10-CM

## 2023-02-04 DIAGNOSIS — J449 Chronic obstructive pulmonary disease, unspecified: Secondary | ICD-10-CM | POA: Diagnosis not present

## 2023-02-04 DIAGNOSIS — C3491 Malignant neoplasm of unspecified part of right bronchus or lung: Secondary | ICD-10-CM | POA: Diagnosis not present

## 2023-02-04 DIAGNOSIS — J9612 Chronic respiratory failure with hypercapnia: Secondary | ICD-10-CM

## 2023-02-04 NOTE — Patient Instructions (Addendum)
Please continue your oxygen at 3L/min Our goal is to keep your saturations above 90% Continue Breztri - please increase to twice a day Keep albuterol available to use 2 puffs up to every 4 hours if needed for shortness of breath, chest tightness, wheezing.  Flu shot up to date Reviewed your CT scan of the chest from 01/15/2023.  This is stable compared with your priors.  Good news. Get your repeat CT in 6 months as per Dr. Asa Lente plans Follow Dr. Delton Coombes in 6 months, sooner if you have any problems.

## 2023-02-04 NOTE — Assessment & Plan Note (Signed)
Continue Breztri - please increase to twice a day Keep albuterol available to use 2 puffs up to every 4 hours if needed for shortness of breath, chest tightness, wheezing.  Flu shot up to date Follow Dr. Delton Coombes in 6 months, sooner if you have any problems.

## 2023-02-04 NOTE — Assessment & Plan Note (Signed)
Please continue your oxygen at 3L/min Our goal is to keep your saturations above 90%

## 2023-02-04 NOTE — Progress Notes (Signed)
   Subjective:    Patient ID: Joanne Rogers, female    DOB: Jun 06, 1945, 78 y.o.   MRN: [id number]  HPI  ROV 11/26/2022 --follow-up visit 78 year old woman with history of severe COPD and right upper lobe adenocarcinoma that was treated with SBRT and complicated by radiation pneumonitis.  She completed a long course of prednisone with improvement in her pulmonary infiltrate.  Currently managed on Breztri. She feels a bit better, is wearing her O2 reliably at 3L/min. She has some cough and mucous in the am when she gets up. She is on breztri but is only taking every day   ROV 02/04/2023 --78 year old woman with severe COPD, SBRT to the right upper lobe adenocarcinoma complicated by radiation pneumonitis and treated with prednisone (improved).  I last saw her in November.  We have been following pulmonary nodular disease and her pneumonitis with serial CT scan imaging.  She is on Breztri, increased to twice daily at her last visit. She remains SOB, rare albuterol use like w shopping.  Still not taking the Breztri bid. No flares. Flu shot up to date.   CT chest 01/15/2023 reviewed by me, shows stable nodular perihilar changes in the right lung with some distortion and some improvement in subtle bilateral groundglass nodularity.  No new nodules or masses.  No adenopathy.   Review of Systems As per HPI     Objective:   Physical Exam Vitals:   02/04/23 1103  BP: 137/71  Pulse: 82  SpO2: 92%  Weight: 169 lb (76.7 kg)  Height: 5' 5.5" (1.664 m)    Gen: Pleasant, well-nourished, in no distress,  normal affect  ENT: No lesions,  mouth clear,  oropharynx clear, no postnasal drip  Neck: No JVD, no stridor  Lungs: No use of accessory muscles, no crackles or wheezing on normal respiration, no wheeze on forced expiration  Cardiovascular: RRR, heart sounds normal, no murmur or gallops, no peripheral edem  Musculoskeletal: No deformities, no cyanosis or clubbing  Neuro: alert, awake, non  focal  Skin: Warm, no lesions or rash      Assessment & Plan:  Chronic respiratory failure with hypoxia and hypercapnia (HCC) Please continue your oxygen at 3L/min Our goal is to keep your saturations above 90%  COPD GOLD III Continue Breztri - please increase to twice a day Keep albuterol available to use 2 puffs up to every 4 hours if needed for shortness of breath, chest tightness, wheezing.  Flu shot up to date Follow Dr. Delton Coombes in 6 months, sooner if you have any problems.  Adenocarcinoma of right lung, stage 2 [facility]) Reviewed your CT scan of the chest from 01/15/2023.  This is stable compared with your priors.  Good news. Get your repeat CT in 6 months as per Dr. Asa Lente plans  Radiation pneumonitis [facility]) There may be a small amount of residual scar present, quite subtle.  No evidence of recurrent pneumonitis.  Reassured the patient about these results.     Levy Pupa, MD, PhD 02/04/2023, 1:37 PM [facility] [phone] or if no answer [phone]

## 2023-02-04 NOTE — Assessment & Plan Note (Signed)
There may be a small amount of residual scar present, quite subtle.  No evidence of recurrent pneumonitis.  Reassured the patient about these results.

## 2023-02-04 NOTE — Assessment & Plan Note (Signed)
Reviewed your CT scan of the chest from 01/15/2023.  This is stable compared with your priors.  Good news. Get your repeat CT in 6 months as per Dr. Asa Lente plans

## 2023-02-25 DIAGNOSIS — L821 Other seborrheic keratosis: Secondary | ICD-10-CM | POA: Diagnosis not present

## 2023-02-25 DIAGNOSIS — Z85828 Personal history of other malignant neoplasm of skin: Secondary | ICD-10-CM | POA: Diagnosis not present

## 2023-02-25 DIAGNOSIS — L905 Scar conditions and fibrosis of skin: Secondary | ICD-10-CM | POA: Diagnosis not present

## 2023-03-08 ENCOUNTER — Telehealth: Payer: Self-pay | Admitting: Emergency Medicine

## 2023-03-08 MED ORDER — DOXYCYCLINE HYCLATE 100 MG PO TABS: 100.0000 mg | ORAL_TABLET | Freq: Two times a day (BID) | ORAL | 0 refills | Status: DC

## 2023-03-08 NOTE — Telephone Encounter (Signed)
 Called and spoke with patient.   Patient stated she has been having increased SOB, head, and chest congestion.  Patient stated she is now having productive cough with thick,yellow sputum.   Patient stated is using Albuterol, nebs, and Spiriva.  Patient stated the only OTC meds she has taken is Tylenol for headache. Patient is requesting any prescriptions to be sent to [facility].  Message routed to Dr. Delton Coombes to advise

## 2023-03-08 NOTE — Telephone Encounter (Signed)
 Please ask her to take doxycycline 100 mg twice daily for 7 days.  If she is not improving then we need to see her in the office in the next week.

## 2023-03-08 NOTE — Telephone Encounter (Signed)
 Patient states having shortness of breath, cough and mucus. Triage nurses not available. Waited over 5 minutes. Pharmacy is [facility] [address]. Patient phone number is [phone].

## 2023-03-08 NOTE — Telephone Encounter (Signed)
 I called and spoke with the pt and notified of response from Dr Delton Coombes  She verbalized understanding  Nothing further needed  Rx was sent to preferred pharm

## 2023-03-18 DIAGNOSIS — J449 Chronic obstructive pulmonary disease, unspecified: Secondary | ICD-10-CM | POA: Diagnosis not present

## 2023-03-18 DIAGNOSIS — F321 Major depressive disorder, single episode, moderate: Secondary | ICD-10-CM | POA: Diagnosis not present

## 2023-03-24 ENCOUNTER — Other Ambulatory Visit: Payer: Self-pay | Admitting: Emergency Medicine

## 2023-04-07 ENCOUNTER — Encounter: Payer: Self-pay | Admitting: Emergency Medicine

## 2023-04-07 ENCOUNTER — Ambulatory Visit: Payer: Medicare HMO | Admitting: Emergency Medicine

## 2023-04-07 VITALS — BP 122/54 | HR 73 | Ht 65.0 in | Wt 165.8 lb

## 2023-04-07 DIAGNOSIS — J449 Chronic obstructive pulmonary disease, unspecified: Secondary | ICD-10-CM | POA: Diagnosis not present

## 2023-04-07 DIAGNOSIS — J9611 Chronic respiratory failure with hypoxia: Secondary | ICD-10-CM

## 2023-04-07 DIAGNOSIS — C3491 Malignant neoplasm of unspecified part of right bronchus or lung: Secondary | ICD-10-CM | POA: Diagnosis not present

## 2023-04-07 DIAGNOSIS — J9612 Chronic respiratory failure with hypercapnia: Secondary | ICD-10-CM | POA: Diagnosis not present

## 2023-04-07 DIAGNOSIS — J301 Allergic rhinitis due to pollen: Secondary | ICD-10-CM

## 2023-04-07 DIAGNOSIS — Z780 Asymptomatic menopausal state: Secondary | ICD-10-CM | POA: Diagnosis not present

## 2023-04-07 DIAGNOSIS — Z01419 Encounter for gynecological examination (general) (routine) without abnormal findings: Secondary | ICD-10-CM | POA: Diagnosis not present

## 2023-04-07 NOTE — Assessment & Plan Note (Signed)
 Continue your oxygen at 3 L/min at all times. Our goal is to keep your oxygen saturations > 90%.  Please do some spot checks of your saturations both at rest and with exertion to ensure that you are not going below 90%.  If this is happening then we should discuss

## 2023-04-07 NOTE — Assessment & Plan Note (Signed)
 Continue Zyrtec through the allergy season.  If you have increased nasal congestion and drainage then okay to get back on your Astelin nasal spray

## 2023-04-07 NOTE — Assessment & Plan Note (Signed)
 Please continue your Breztri 2 puffs twice a day.  Rinse and gargle after using. Keep albuterol available to use 2 puffs when needed for shortness of breath, chest tightness, wheezing.

## 2023-04-07 NOTE — Assessment & Plan Note (Signed)
 Get your repeat CT scan of the chest in June as per Dr. Asa Lente plans

## 2023-04-07 NOTE — Progress Notes (Addendum)
 Subjective:    Patient ID: Joanne Rogers, female    DOB: [birth year], 78 y.o.   MRN: [id number]  HPI  ROV 11/26/2022 --follow-up visit 78 year old woman with history of severe COPD and right upper lobe adenocarcinoma that was treated with SBRT and complicated by radiation pneumonitis.  She completed a long course of prednisone  with improvement in her pulmonary infiltrate.  Currently managed on Breztri . She feels a bit better, is wearing her O2 reliably at 3L/min. She has some cough and mucous in the am when she gets up. She is on breztri  but is only taking every day   ROV 02/04/2023 --78 year old woman with severe COPD, SBRT to the right upper lobe adenocarcinoma complicated by radiation pneumonitis and treated with prednisone  (improved).  I last saw her in November.  We have been following pulmonary nodular disease and her pneumonitis with serial CT scan imaging.  She is on Breztri , increased to twice daily at her last visit. She remains SOB, rare albuterol  use like w shopping.  Still not taking the Breztri  bid. No flares. Flu shot up to date.   CT chest 01/15/2023 reviewed by me, shows stable nodular perihilar changes in the right lung with some distortion and some improvement in subtle bilateral groundglass nodularity.  No new nodules or masses.  No adenopathy.  ROV 04/07/2023 --Annely is 1 with severe COPD, history of right upper lobe adenocarcinoma that was treated by SBRT complicated by radiation pneumonitis (improved after prednisone ).  We have been following CT scans of the chest to follow infiltrates, nodular disease.  She has been managed on Breztri .  Unfortunately she had suspected influenza, she was treated w pred and doxycycline  for an AE by me. She is on 3L/min. She confirmed that she is on breztri . She is on zyrtec  prn, is not using any nasal sprays right now.    Review of Systems As per HPI     Objective:   Physical Exam Vitals:   04/07/23 1053  BP: (!) 122/54  Pulse: 73   SpO2: 97%  Weight: 165 lb 12.8 oz (75.2 kg)  Height: 5' 5 (1.651 m)    Gen: Pleasant, well-nourished, in no distress,  normal affect  ENT: No lesions,  mouth clear,  oropharynx clear, no postnasal drip  Neck: No JVD, no stridor  Lungs: No use of accessory muscles, no crackles or wheezing on normal respiration, no wheeze on forced expiration  Cardiovascular: RRR, heart sounds normal, no murmur or gallops, no peripheral edem  Musculoskeletal: No deformities, no cyanosis or clubbing  Neuro: alert, awake, non focal  Skin: Warm, no lesions or rash      Assessment & Plan:  COPD GOLD III Please continue your Breztri  2 puffs twice a day.  Rinse and gargle after using. Keep albuterol  available to use 2 puffs when needed for shortness of breath, chest tightness, wheezing.  Chronic respiratory failure with hypoxia and hypercapnia (HCC) Continue your oxygen  at 3 L/min at all times. Our goal is to keep your oxygen  saturations > 90%.  Please do some spot checks of your saturations both at rest and with exertion to ensure that you are not going below 90%.  If this is happening then we should discuss  Adenocarcinoma of right lung, stage 2 [facility]) Get your repeat CT scan of the chest in June as per Dr. Jeannett plans  Allergic rhinitis Continue Zyrtec  through the allergy season.  If you have increased nasal congestion and drainage then okay to get back on  your Astelin nasal spray    Lamar Chris, MD, PhD 04/07/2023, 4:22 PM [facility] [phone] or if no answer [phone]   Addendum:  I have confirmed that the patient needs a Rollator / rolling walker with a seat due to her chronic respiratory failure and functional limitation caused by COPD and adenocarcinoma of the lung. This will help her with mobility, overall functional capacity, ADL's, independence.   Lamar Chris, MD, PhD 07/23/2023, 12:01 PM  [facility] [phone] or if  no answer before 7:00PM call [phone] For any issues after 7:00PM please call eLink [phone]

## 2023-04-07 NOTE — Patient Instructions (Signed)
 Please continue your Breztri 2 puffs twice a day.  Rinse and gargle after using. Keep albuterol available to use 2 puffs when needed for shortness of breath, chest tightness, wheezing. Continue your oxygen at 3 L/min at all times. Our goal is to keep your oxygen saturations > 90%.  Please do some spot checks of your saturations both at rest and with exertion to ensure that you are not going below 90%.  If this is happening then we should discuss. Get your repeat CT scan of the chest in June as per Dr. Asa Lente plans Continue Zyrtec through the allergy season.  If you have increased nasal congestion and drainage then okay to get back on your Astelin nasal spray Follow-up with Dr. Delton Coombes in June after your CT so we can review those results together.

## 2023-05-11 ENCOUNTER — Telehealth: Payer: Self-pay

## 2023-05-11 NOTE — Telephone Encounter (Signed)
 Pt daughter is concerned as she has been receiving Breo in the mail instead of Breztri  she is using the Breo one puff once daily. They have never received an RX for Breztri  only samples in the past she thinks she started using the Breo when the Breztri  samples ran out. Please advise  If going on Breztri  from [facility] do we need to taper in or how do we handle this.  Please advise    Copied from CRM [id number]. Topic: General - Other >> May 10, 2023 11:45 AM Juliaette Ober wrote: Reason for CRM: patient daughter calling to speak with nurse about patient inhaler, please call at [phone]

## 2023-05-12 MED ORDER — BREZTRI AEROSPHERE 160-9-4.8 MCG/ACT IN AERO: 2.0000 | INHALATION_SPRAY | Freq: Two times a day (BID) | RESPIRATORY_TRACT | 11 refills | Status: DC

## 2023-05-12 NOTE — Addendum Note (Signed)
 Addended by: Denson Flake on: 05/12/2023 05:18 PM   Modules accepted: Orders

## 2023-05-12 NOTE — Telephone Encounter (Signed)
 Discussed the situation with the patient's daughter [facility] by phone.  Unclear to me how the Breztri  got changed to [facility], question whether there was an old prescription still open at her pharmacy that she was able to get.  I do want her to stay on the Breztri  and I will send a new prescription now

## 2023-05-12 NOTE — Telephone Encounter (Signed)
 Copied from CRM [id number]. Topic: Clinical - Medical Advice >> May 12, 2023  2:31 PM Tyronne Galloway wrote: Reason for CRM: [name] daughter Zenon Hilda is calling back regarding medication breo and breztri  inhalers. Hope's concern is that her mother has been receiving Breo in the mail instead of Breztri . Lucious Sabina CMA routed an encounter to Dr. Baldwin Levee yesterday morning, however he has not responded. CAL did not answer. Please call the patient's daughter back at [phone]. Hope will not be answering the phone from 4pm-5pm due to physical therapy (today) Okay to call back tomorrow if need be. Okay to leave a vm.  Spoke with patient's daughter [place] regarding prior message . Advised Hope I will send another message to Dr.Byrum as he is seeing patient;'s today in clinic . Hope stated she is ok with waiting until tomorrow .   Dr.Byrum can you please advise . Thank you

## 2023-05-17 DIAGNOSIS — H4720 Unspecified optic atrophy: Secondary | ICD-10-CM | POA: Diagnosis not present

## 2023-05-17 DIAGNOSIS — Z961 Presence of intraocular lens: Secondary | ICD-10-CM | POA: Diagnosis not present

## 2023-05-17 DIAGNOSIS — H35372 Puckering of macula, left eye: Secondary | ICD-10-CM | POA: Diagnosis not present

## 2023-05-17 DIAGNOSIS — H52203 Unspecified astigmatism, bilateral: Secondary | ICD-10-CM | POA: Diagnosis not present

## 2023-05-17 DIAGNOSIS — H26493 Other secondary cataract, bilateral: Secondary | ICD-10-CM | POA: Diagnosis not present

## 2023-05-17 DIAGNOSIS — H524 Presbyopia: Secondary | ICD-10-CM | POA: Diagnosis not present

## 2023-06-21 DIAGNOSIS — R2243 Localized swelling, mass and lump, lower limb, bilateral: Secondary | ICD-10-CM | POA: Diagnosis not present

## 2023-06-21 DIAGNOSIS — R7301 Impaired fasting glucose: Secondary | ICD-10-CM | POA: Diagnosis not present

## 2023-06-29 DIAGNOSIS — J449 Chronic obstructive pulmonary disease, unspecified: Secondary | ICD-10-CM | POA: Diagnosis not present

## 2023-06-30 ENCOUNTER — Telehealth: Payer: Self-pay | Admitting: Emergency Medicine

## 2023-06-30 ENCOUNTER — Ambulatory Visit: Admitting: Emergency Medicine

## 2023-06-30 NOTE — Telephone Encounter (Signed)
 [name] daughter is calling (Hope/DPR) We had called the mom to change the sched appt today because her CT was not until 6/30. The mom has a bit of dementia so she said that was fine when we called her but when daughter got message she did not understand why and called upset this AM. When I explained why she said she wanted a more immediate appt after the CT and with Dr. Baldwin Levee, not Ms. Margit Shelling. Daughter also has questions about moms O2 intake. She averages 92-96 levels and is on setting #3 now 24/7. She is wondering if they need to increase levels or if possible the POC is not putting out a strong enough concentration of O2. Daughter is at [phone]

## 2023-07-01 DIAGNOSIS — H26493 Other secondary cataract, bilateral: Secondary | ICD-10-CM | POA: Diagnosis not present

## 2023-07-01 NOTE — Telephone Encounter (Signed)
 Spoke with daughter [place] per [place]. I explained to pts daughter why appointment was cancelled and she understood. Patient has early stage dementia and was confused to why it was cancelled. RB has no sooner appts. Offered daughter appt options to be scheduled with MW and pt has been scheduled for 7/8 since she has seen him before. Hope wanted me to schedule appt slot so it did not get taken and is going to make sure it is OK with patient to keep that appt. Pt is scheduled 7/8 & 7/18, will wait for callback to cancel whichever appt pt prefers.  Daughter also informed me pt wears 3L POC and sats were 92-93% . Daughter is concerned pt is not getting enough oxygen  on POC. I advised daughter to bring mothers POC into ov so we can evaluate her.  Will wait for call back.

## 2023-07-05 DIAGNOSIS — Z9981 Dependence on supplemental oxygen: Secondary | ICD-10-CM | POA: Diagnosis not present

## 2023-07-12 ENCOUNTER — Other Ambulatory Visit: Payer: Medicare HMO

## 2023-07-12 ENCOUNTER — Ambulatory Visit (HOSPITAL_COMMUNITY)
Admission: RE | Admit: 2023-07-12 | Discharge: 2023-07-12 | Disposition: A | Discharge status: Home / Self Care | Source: Ambulatory Visit | Attending: Internal Medicine | Admitting: Internal Medicine

## 2023-07-12 ENCOUNTER — Inpatient Hospital Stay: Attending: Internal Medicine

## 2023-07-12 DIAGNOSIS — C3491 Malignant neoplasm of unspecified part of right bronchus or lung: Secondary | ICD-10-CM | POA: Diagnosis not present

## 2023-07-12 DIAGNOSIS — C3490 Malignant neoplasm of unspecified part of unspecified bronchus or lung: Secondary | ICD-10-CM

## 2023-07-12 DIAGNOSIS — E041 Nontoxic single thyroid nodule: Secondary | ICD-10-CM | POA: Diagnosis not present

## 2023-07-12 DIAGNOSIS — I700 Atherosclerosis of aorta: Secondary | ICD-10-CM | POA: Diagnosis not present

## 2023-07-12 LAB — CBC WITH DIFFERENTIAL (CANCER CENTER ONLY)
Abs Immature Granulocytes: 0.03 10*3/uL (ref 0.00–0.07)
Basophils Absolute: 0 10*3/uL (ref 0.0–0.1)
Basophils Relative: 0 %
Eosinophils Absolute: 0.2 10*3/uL (ref 0.0–0.5)
Eosinophils Relative: 2 %
HCT: 33.3 % — ABNORMAL LOW (ref 36.0–46.0)
Hemoglobin: 10.7 g/dL — ABNORMAL LOW (ref 12.0–15.0)
Immature Granulocytes: 0 %
Lymphocytes Relative: 13 %
Lymphs Abs: 1 10*3/uL (ref 0.7–4.0)
MCH: 31.4 pg (ref 26.0–34.0)
MCHC: 32.1 g/dL (ref 30.0–36.0)
MCV: 97.7 fL (ref 80.0–100.0)
Monocytes Absolute: 0.7 10*3/uL (ref 0.1–1.0)
Monocytes Relative: 8 %
Neutro Abs: 6 10*3/uL (ref 1.7–7.7)
Neutrophils Relative %: 77 %
Platelet Count: 207 10*3/uL (ref 150–400)
RBC: 3.41 MIL/uL — ABNORMAL LOW (ref 3.87–5.11)
RDW: 12.5 % (ref 11.5–15.5)
WBC Count: 7.9 10*3/uL (ref 4.0–10.5)
nRBC: 0 % (ref 0.0–0.2)

## 2023-07-12 LAB — CMP (CANCER CENTER ONLY)
ALT: 18 U/L (ref 0–44)
AST: 18 U/L (ref 15–41)
Albumin: 4.1 g/dL (ref 3.5–5.0)
Alkaline Phosphatase: 86 U/L (ref 38–126)
Anion gap: 4 — ABNORMAL LOW (ref 5–15)
BUN: 13 mg/dL (ref 8–23)
CO2: 35 mmol/L — ABNORMAL HIGH (ref 22–32)
Calcium: 10.1 mg/dL (ref 8.9–10.3)
Chloride: 100 mmol/L (ref 98–111)
Creatinine: 0.5 mg/dL (ref 0.44–1.00)
GFR, Estimated: >60 mL/min (ref >60)
Glucose, Bld: 122 mg/dL — ABNORMAL HIGH (ref 70–99)
Potassium: 4.3 mmol/L (ref 3.5–5.1)
Sodium: 139 mmol/L (ref 135–145)
Total Bilirubin: 0.4 mg/dL (ref 0.0–1.2)
Total Protein: 7.2 g/dL (ref 6.5–8.1)

## 2023-07-12 MED ORDER — IOHEXOL 300 MG/ML  SOLN
75.0000 mL | Freq: Once | INTRAMUSCULAR | Status: AC | PRN
  Administered 2023-07-12: 75 mL via INTRAVENOUS

## 2023-07-12 MED ORDER — SODIUM CHLORIDE (PF) 0.9 % IJ SOLN
INTRAMUSCULAR | Status: AC
  Filled 2023-07-12: qty 50

## 2023-07-12 NOTE — Telephone Encounter (Signed)
 Copied from CRM [id number]. Topic: General - Call Back - No Documentation >> Jul 02, 2023  2:15 PM Dustin F wrote: Reason for CRM: [name] daughter [facility] returning phone call to [place] CMA. Hope had talked with her yesterday regarding scheduling an appt for her mother as well as oxygen  concerns. I called CAL as the clinic is closing very soon, and the front desk said she was not available. Hope's call back phone number [phone]. Hope stated she wanted to be on a list for an appt with Dr. Shelah following the ct scan on 6/30. She requested to cancel the appt on 07/20/2023 at 130pm at Dr. Darlean.  Appt cancelled per pt request.

## 2023-07-13 ENCOUNTER — Telehealth: Payer: Self-pay

## 2023-07-13 DIAGNOSIS — J449 Chronic obstructive pulmonary disease, unspecified: Secondary | ICD-10-CM

## 2023-07-13 NOTE — Telephone Encounter (Signed)
 Copied from CRM [id number]. Topic: Clinical - Order For Equipment >> Jun 25, 2023  4:12 PM Celestine FALCON wrote: Reason for CRM: Pt is requesting an order for a new rollator walker with seat. [name] best phone number is [phone] ok to leave a vm.  Dr. Shelah please advise if OK to place dme order for walker.  Order is pended.

## 2023-07-14 NOTE — Telephone Encounter (Signed)
 Yes this is okay

## 2023-07-14 NOTE — Telephone Encounter (Signed)
Order has been placed and patient is aware

## 2023-07-20 ENCOUNTER — Ambulatory Visit: Admitting: Internal Medicine

## 2023-07-20 ENCOUNTER — Telehealth: Payer: Self-pay | Admitting: Internal Medicine

## 2023-07-20 NOTE — Telephone Encounter (Signed)
 Rescheduled appointments with the patient per provider out of office.

## 2023-07-21 ENCOUNTER — Telehealth: Payer: Self-pay | Admitting: Emergency Medicine

## 2023-07-21 NOTE — Telephone Encounter (Signed)
 Need to know what they need to see written

## 2023-07-21 NOTE — Telephone Encounter (Signed)
 Per Avelina at [facility]-  We are needing narrative entered into a progress note by provider within the last 90days please.

## 2023-07-21 NOTE — Telephone Encounter (Signed)
 Joanne Rogers is this in regards to the rollator walker with seat? Can you please specify what they need written in the progress note

## 2023-07-22 ENCOUNTER — Inpatient Hospital Stay: Payer: Medicare HMO | Admitting: Internal Medicine

## 2023-07-22 NOTE — Telephone Encounter (Signed)
 It is. I have messaged the DME to get more clarification.

## 2023-07-22 NOTE — Telephone Encounter (Signed)
 Per Avelina at [employer]-  Just stating due to (insert DX) pt needs a Rollator/ rolling walker with seat. The patient will benefit (XYZ).

## 2023-07-22 NOTE — Telephone Encounter (Signed)
 Dr. Byrum can you add that information to progress note?

## 2023-07-23 NOTE — Telephone Encounter (Signed)
 I am happy to addend her last office visit, but it looks like it was more than 90 days ago. I will go ahead and addend the OV from 04/07/23.

## 2023-07-23 NOTE — Telephone Encounter (Signed)
 Put in appointment note,will do at next OV

## 2023-07-23 NOTE — Telephone Encounter (Signed)
 Patient has upcoming OV coming up with Lauraine.   Routing to Lauraine so patient can obtain rollator/rolling walker with a seat since lov is out of 90 day time frame.   Per Avelina at [employer]-   Just stating due to (insert DX) pt needs a Rollator/ rolling walker with seat. The patient will benefit (XYZ).   Please see message above can you add that to patients upcoming office visit (7/17) progress note.  Routing to [place] as well as FYI Thank you!

## 2023-07-26 NOTE — Progress Notes (Unsigned)
 History of Present Illness Joanne Rogers is a 78 y.o. female with ***   07/26/2023  I placed the order for this 7/2. All that is needed is clarification in progress note at upcoming ov 7/17 stating why this patient needs rollator/rolling walking with seat, patients diagnosis and how this patient will benefit using walker   Per Avelina at [facility]- Just stating due to (insert DX) pt needs a Rollator/ rolling walker with seat. The patient will benefit (XYZ).  Test Results:     Latest Ref Rng & Units 07/12/2023    9:53 AM 01/15/2023    3:57 PM 07/20/2022    3:26 PM  CBC  WBC 4.0 - 10.5 K/uL 7.9  7.4  6.5   Hemoglobin 12.0 - 15.0 g/dL 89.2  89.0  88.3   Hematocrit 36.0 - 46.0 % 33.3  33.4  35.2   Platelets 150 - 400 K/uL 207  207  212        Latest Ref Rng & Units 07/12/2023    9:53 AM 01/15/2023    3:57 PM 07/20/2022    3:26 PM  BMP  Glucose 70 - 99 mg/dL 877  889  889   BUN 8 - 23 mg/dL 13  11  13    Creatinine 0.44 - 1.00 mg/dL 9.49  9.54  9.37   Sodium 135 - 145 mmol/L 139  138  133   Potassium 3.5 - 5.1 mmol/L 4.3  4.0  4.1   Chloride 98 - 111 mmol/L 100  98  97   CO2 22 - 32 mmol/L 35  34  30   Calcium 8.9 - 10.3 mg/dL 89.8  9.4  9.7     BNP No results found for: BNP  ProBNP No results found for: PROBNP  PFT    Component Value Date/Time   FEV1PRE 0.93 10/13/2021 0942   FEV1POST 0.87 10/13/2021 0942   FVCPRE 1.63 10/13/2021 0942   FVCPOST 1.72 10/13/2021 0942   TLC 5.47 10/13/2021 0942   DLCOUNC 14.84 10/13/2021 0942   PREFEV1FVCRT 57 10/13/2021 0942   PSTFEV1FVCRT 51 10/13/2021 0942    CT Chest W Contrast Result Date: 07/12/2023 CLINICAL DATA:  Non-small cell lung cancer (NSCLC), staging. * Tracking Code: BO * EXAM: CT CHEST WITH CONTRAST TECHNIQUE: Multidetector CT imaging of the chest was performed during intravenous contrast administration. RADIATION DOSE REDUCTION: This exam was performed according to the departmental dose-optimization program which  includes automated exposure control, adjustment of the mA and/or kV according to patient size and/or use of iterative reconstruction technique. CONTRAST:  75mL OMNIPAQUE  IOHEXOL  300 MG/ML  SOLN COMPARISON:  CT scan chest from 01/15/2023. FINDINGS: Cardiovascular: Normal cardiac size. No pericardial effusion. No aortic aneurysm. There are coronary artery calcifications, in keeping with coronary artery disease. There are also moderate peripheral atherosclerotic vascular calcifications of thoracic aorta and its major branches. Mediastinum/Nodes: Redemonstration of heterogeneous thyroid  gland containing a 1.1 x 1.6 cm hypoattenuating nodule in the right posterior thyroid  lobe, incompletely characterized on the current exam but grossly similar to the prior study. Please refer to ultrasound thyroid  from 07/28/2022 for details. No solid / cystic mediastinal masses. The esophagus is nondistended precluding optimal assessment. There are few mildly prominent mediastinal and hilar lymph nodes, which do not meet the size criteria for lymphadenopathy and appear grossly similar to the prior study. No axillary lymphadenopathy by size criteria. Lungs/Pleura: The central tracheo-bronchial tree is patent. Redemonstration of irregular heterogeneous soft tissue surrounding the right upper lobe central bronchial  tree which is essentially unchanged since the prior study. There are also stable linear areas of scarring/atelectasis in the right upper lobe, superior segment of right lower lobe and lingular segment of left upper lobe, unchanged. No mass or consolidation. No pleural effusion or pneumothorax. No new or suspicious lung nodules. Previously described faint ground-glass nodule in the middle lobe is unchanged (series 6, image 72). Upper Abdomen: There are at least 4, ill-defined hypoattenuating foci in the left hepatic lobe with largest measuring up to 1.0 x 1.4 cm, which can be characterized as a simple cyst. However, other lesions  are too small to adequately characterize but favored to represent cysts as well. Remaining visualized upper abdominal viscera within normal limits. Musculoskeletal: The visualized soft tissues of the chest wall are grossly unremarkable. No suspicious osseous lesions. There are mild multilevel degenerative changes in the visualized spine. IMPRESSION: 1. Stable exam. No new or enlarging lung mass or suspicious lung nodule. 2. Multiple other nonacute observations, as described above. Aortic Atherosclerosis (ICD10-I70.0). Electronically Signed   By: Ree Molt M.D.   On: 07/12/2023 11:54     Past medical hx Past Medical History:  Diagnosis Date   Arthritis    back, hands, neck   COPD (chronic obstructive pulmonary disease) (HCC)    Coronary artery disease 11/28/2019   CCTA 11/28/19: Mild non-obstructive CAD, myocardial bridge mid LAD (cardiologist Dr. Cordell Huntsman, 08/19/21)   DDD (degenerative disc disease), lumbar    Dyspnea    uses oxygen  at home   H/O bronchitis    allergy related   Hypercholesterolemia    Oxygen  dependent    Palpitations    fluttering   Pneumonia    hx of     Social History   Tobacco Use   Smoking status: Former    Current packs/day: 0.00    Average packs/day: 0.5 packs/day for 25.0 years (12.5 ttl pk-yrs)    Types: Cigarettes    Start date: 05/16/1980    Quit date: 05/16/2005    Years since quitting: 18.2   Smokeless tobacco: Never  Vaping Use   Vaping status: Never Used  Substance Use Topics   Alcohol use: Yes    Alcohol/week: 6.0 standard drinks of alcohol    Types: 6 Standard drinks or equivalent per week    Comment: social beer   Drug use: No    Joanne Rogers reports that she quit smoking about 18 years ago. Her smoking use included cigarettes. She started smoking about 43 years ago. She has a 12.5 pack-year smoking history. She has never used smokeless tobacco. She reports current alcohol use of about 6.0 standard drinks of alcohol per week. She reports  that she does not use drugs.  Tobacco Cessation: Counseling given: Not Answered   Past surgical hx, Family hx, Social hx all reviewed.  Current Outpatient Medications on File Prior to Visit  Medication Sig   acetaminophen  (TYLENOL ) 500 MG tablet Take 1,000 mg by mouth every 6 (six) hours as needed for moderate pain.   albuterol  (VENTOLIN  HFA) 108 (90 Base) MCG/ACT inhaler Inhale 1-2 puffs into the lungs every 4 (four) hours as needed for wheezing or shortness of breath.   ascorbic acid (VITAMIN C) 500 MG tablet Take 500 mg by mouth daily.   aspirin 81 MG tablet Take 81 mg by mouth at bedtime.   azelastine (ASTELIN) 0.1 % nasal spray Place 1 spray into both nostrils as needed for rhinitis.   beta carotene w/minerals (OCUVITE) tablet Take 1 tablet  by mouth at bedtime.   bisoprolol  (ZEBETA ) 5 MG tablet TAKE 1 TABLET EVERY DAY   budeson-glycopyrrolate -formoterol  (BREZTRI  AEROSPHERE) 160-9-4.8 MCG/ACT AERO inhaler Inhale 2 puffs into the lungs in the morning and at bedtime.   cetirizine  (ZYRTEC ) 10 MG tablet Take 10 mg by mouth at bedtime.   cholecalciferol (VITAMIN D3) 25 MCG (1000 UNIT) tablet Take 1,000 Units by mouth daily.   Cyanocobalamin  (B-12) 2500 MCG TABS Take 2,500 mcg by mouth daily.   doxycycline  (VIBRA -TABS) 100 MG tablet Take 1 tablet (100 mg total) by mouth 2 (two) times daily.   famotidine  (PEPCID ) 20 MG tablet One at bedtime   ibuprofen (ADVIL) 200 MG tablet Take 400 mg by mouth every 6 (six) hours as needed for moderate pain.   ipratropium-albuterol  (DUONEB) 0.5-2.5 (3) MG/3ML SOLN Take 3 mLs by nebulization every 4 (four) hours. Dx: J44.9   Multiple Minerals-Vitamins (CAL MAG ZINC  +D3) TABS Take 1-2 tablets by mouth See admin instructions. Take 1 tablet in the morning and 2 tablets in the evening   OXYGEN  2lpm with sleep   Polyethyl Glycol-Propyl Glycol (SYSTANE OP) Place 1 drop into both eyes daily as needed (dry eyes).   Respiratory Therapy Supplies (FLUTTER) DEVI 1  Device by Does not apply route as needed.   valACYclovir  (VALTREX ) 1000 MG tablet Take 500 mg by mouth every other day.    No current facility-administered medications on file prior to visit.     Allergies  Allergen Reactions   Codeine Hives   Amoxicillin  Nausea And Vomiting   Latex Hives and Rash   Levofloxacin Nausea Only   Statins Other (See Comments)    Muscle aches   Wound Dressing Adhesive Rash    Review Of Systems:  Constitutional:   No  weight loss, night sweats,  Fevers, chills, fatigue, or  lassitude.  HEENT:   No headaches,  Difficulty swallowing,  Tooth/dental problems, or  Sore throat,                No sneezing, itching, ear ache, nasal congestion, post nasal drip,   CV:  No chest pain,  Orthopnea, PND, swelling in lower extremities, anasarca, dizziness, palpitations, syncope.   GI  No heartburn, indigestion, abdominal pain, nausea, vomiting, diarrhea, change in bowel habits, loss of appetite, bloody stools.   Resp: No shortness of breath with exertion or at rest.  No excess mucus, no productive cough,  No non-productive cough,  No coughing up of blood.  No change in color of mucus.  No wheezing.  No chest wall deformity  Skin: no rash or lesions.  GU: no dysuria, change in color of urine, no urgency or frequency.  No flank pain, no hematuria   MS:  No joint pain or swelling.  No decreased range of motion.  No back pain.  Psych:  No change in mood or affect. No depression or anxiety.  No memory loss.   Vital Signs There were no vitals taken for this visit.   Physical Exam:  General- No distress,  A&Ox3 ENT: No sinus tenderness, TM clear, pale nasal mucosa, no oral exudate,no post nasal drip, no LAN Cardiac: S1, S2, regular rate and rhythm, no murmur Chest: No wheeze/ rales/ dullness; no accessory muscle use, no nasal flaring, no sternal retractions Abd.: Soft Non-tender Ext: No clubbing cyanosis, edema Neuro:  normal strength Skin: No rashes, warm and  dry Psych: normal mood and behavior   Assessment/Plan  No problem-specific Assessment & Plan notes found for this  encounter.    Lauraine JULIANNA Lites, NP 07/26/2023  3:00 PM

## 2023-07-26 NOTE — Telephone Encounter (Signed)
 I placed the order for this 7/2. All that is needed is clarification in progress note at upcoming ov 7/17 stating why this patient needs rollator/rolling walking with seat, patients diagnosis and how this patient will benefit using walker  Per Avelina at [facility]- Just stating due to (insert DX) pt needs a Rollator/ rolling walker with seat. The patient will benefit (XYZ).

## 2023-07-29 ENCOUNTER — Ambulatory Visit: Admitting: Acute Care

## 2023-07-29 ENCOUNTER — Telehealth: Payer: Self-pay | Admitting: Cardiology

## 2023-07-29 ENCOUNTER — Encounter: Payer: Self-pay | Admitting: Acute Care

## 2023-07-29 VITALS — BP 118/57 | HR 72 | Ht 65.0 in | Wt 164.4 lb

## 2023-07-29 DIAGNOSIS — J700 Acute pulmonary manifestations due to radiation: Secondary | ICD-10-CM | POA: Diagnosis not present

## 2023-07-29 DIAGNOSIS — Z923 Personal history of irradiation: Secondary | ICD-10-CM | POA: Diagnosis not present

## 2023-07-29 DIAGNOSIS — R918 Other nonspecific abnormal finding of lung field: Secondary | ICD-10-CM

## 2023-07-29 DIAGNOSIS — C3411 Malignant neoplasm of upper lobe, right bronchus or lung: Secondary | ICD-10-CM | POA: Diagnosis not present

## 2023-07-29 DIAGNOSIS — J449 Chronic obstructive pulmonary disease, unspecified: Secondary | ICD-10-CM

## 2023-07-29 MED ORDER — BREZTRI AEROSPHERE 160-9-4.8 MCG/ACT IN AERO: 2.0000 | INHALATION_SPRAY | Freq: Two times a day (BID) | RESPIRATORY_TRACT | Status: DC

## 2023-07-29 MED ORDER — BREZTRI AEROSPHERE 160-9-4.8 MCG/ACT IN AERO: 2.0000 | INHALATION_SPRAY | Freq: Two times a day (BID) | RESPIRATORY_TRACT | 3 refills | Status: DC

## 2023-07-29 NOTE — Telephone Encounter (Signed)
 Returned pt's phone call regarding swelling in BLE. Pt states she has equal amount of swelling in both feet and ankles that started about one week ago. Denies SOB (is currently on continuous Oxygen , 3L) but breathing patterns have not changed, denies dizziness/lightheadedness, and denies Chest Pain. Also denies areas of redness and increased warmth in feet/ankles.    Advised pt to wear compression hose during the day and elevate legs when ever possible. Will route to scheduling to hopefully add her to a day on Dr. Ginette schedule for 08/2023.

## 2023-07-29 NOTE — Telephone Encounter (Signed)
 Pt c/o swelling/edema: STAT if pt has developed SOB within 24 hours  If swelling, where is the swelling located? Legs and ankles  How much weight have you gained and in what time span? Not sure  Have you gained 2 pounds in a day or 5 pounds in a week? Not sure  Do you have a log of your daily weights (if so, list)? no  Are you currently taking a fluid pill? no  Are you currently SOB? yes  Have you traveled recently in a car or plane for an extended period of time? No Pt went to Pulmonary and they told her to contact us 

## 2023-07-29 NOTE — Patient Instructions (Addendum)
 It is good to see you today Your CT chest is stable.  This is good news. Dr. Gatha will most likely do a 6 month follow up scan due in December or January. You will follow up with Dr. Sherrod 7/29 to review the results. We have [place] the [place] paperwork to decrease cost of Breztri .  Continue using Breztri  2 puffs twice daily . Rinse mouth after use.  Ok to increase your portable oxygen  to 4 L for a short time when you get winded due to exertion Use albuterol  as needed for breakthrough shortness of breath or wheezing. Note your daily symptoms > remember red flags for COPD:  Increase in cough, increase in sputum production, increase in shortness of breath or activity intolerance. If you notice these symptoms, please call to be seen.    We have written statements to support you getting your rolator chair with seat.  This should help you get your chair sooner.  Follow up with cardiology for the swelling in your legs. Continue using your compression stockings and elevating your legs as needed.  Limit salt intake Weigh daily. Wake up, void, them weigh yourself to see if you have any weight gain.  Follow up in 3 months with Dr. Shelah to make sure you are still doing well. Call if you need us  sooner. Please contact office for sooner follow up if symptoms do not improve or worsen or seek emergency care

## 2023-07-30 ENCOUNTER — Ambulatory Visit: Admitting: Acute Care

## 2023-08-02 NOTE — Telephone Encounter (Signed)
 Is in note from visit on 7/17

## 2023-08-02 NOTE — Telephone Encounter (Signed)
 Patient scheduled to come in on 07/23 at 10:55AM to see Dayna Dunn,PA. FYI

## 2023-08-02 NOTE — Progress Notes (Unsigned)
 Cardiology Office Note    Date:  08/04/2023  ID:  Zameria, Vogl April 17, 1945, MRN [id number] PCP:  Chrystal Lamarr RAMAN, MD  Cardiologist:  Dub Huntsman, DO  Electrophysiologist:  None   Chief Complaint: edema  History of Present Illness: .    Joanne Rogers is a 78 y.o. female with visit-pertinent history of mild CAD, HLD intolerant of statins, arthritis, severe COPD with chronic respiratory failure on 3L home O2, RUL adenocarcinoma treated with SRT complicated by radiation pneumonitis, IRBBB+LAFB seen for evaluation of swelling. Cor CT 2021 with mild nonobstructive CAD, CAC 150, myocardial bridge in mLAD. Patient has deferred non-statin lipid lowering agents. She is followed by pulmonology. When she remotely saw Dr. Alveta she also carried hx of chronic diastolic CHF as she previously had G1DD. Last echo 2021 EF 55-60%, normal RV, normal diastolic parameters. She is on bisoprolol  for history of palpitations.   She is seen for evaluation of edema. In early June she noticed some increasing LE edema so saw her PCP. Labs were checked with normal Cr, albumin, BNP. Her daughter purchased some compression hose and had her elevate her legs and edema resolved. She now experiences it maybe once a week, worse in the evening after she's been on her feet. No worsening dyspnea from baseline, orthopnea, abdominal distension, or weight gain (weight down 5lb from 01/2023).   Labwork independently reviewed: 06/2023 Hgb 10.7, plt 207 chronic appearing anemia, K 4.3, Cr 0.50, LFTs ok, CareEverywhere TSH wnl, BNP wnl (44) 08/2022 TSH ok, LDL 215, trig 154 08/2021 LDL 164, trig 129  ROS: .    Please see the history of present illness.  All other systems are reviewed and otherwise negative.  Studies Reviewed: SABRA    EKG:  EKG is ordered today, personally reviewed, demonstrating: EKG Interpretation Date/Time:  Wednesday August 04 2023 11:04:57 EDT Ventricular Rate:  73 PR Interval:  186 QRS  Duration:  104 QT Interval:  408 QTC Calculation: 449 R Axis:   -46  Text Interpretation: Sinus rhythm with occasional Premature ventricular complexes Incomplete right bundle branch block Left anterior fascicular block Left ventricular hypertrophy ( R in aVL , Cornell product , Romhilt-Estes ) Cannot rule out Septal infarct (cited on or before 29-Sep-2021) Occasional PVCs otherwise n o acute change from prior Confirmed by Kei Mcelhiney [phone]) on 08/04/2023 11:15:21 AM    CV Studies: Cardiac studies reviewed are outlined and summarized above. Otherwise please see EMR for full report.   Current Reported Medications:.    Current Meds  Medication Sig   acetaminophen  (TYLENOL ) 500 MG tablet Take 1,000 mg by mouth every 6 (six) hours as needed for moderate pain.   albuterol  (VENTOLIN  HFA) 108 (90 Base) MCG/ACT inhaler Inhale 1-2 puffs into the lungs every 4 (four) hours as needed for wheezing or shortness of breath.   ascorbic acid (VITAMIN C) 500 MG tablet Take 500 mg by mouth daily.   aspirin 81 MG tablet Take 81 mg by mouth at bedtime.   azelastine (ASTELIN) 0.1 % nasal spray Place 1 spray into both nostrils as needed for rhinitis.   beta carotene w/minerals (OCUVITE) tablet Take 1 tablet by mouth at bedtime.   bisoprolol  (ZEBETA ) 5 MG tablet TAKE 1 TABLET EVERY DAY   budesonide -glycopyrrolate -formoterol  (BREZTRI  AEROSPHERE) 160-9-4.8 MCG/ACT AERO inhaler Inhale 2 puffs into the lungs in the morning and at bedtime.   cetirizine  (ZYRTEC ) 10 MG tablet Take 10 mg by mouth at bedtime.   cholecalciferol (VITAMIN D3) 25  MCG (1000 UNIT) tablet Take 1,000 Units by mouth daily.   Cyanocobalamin  (B-12) 2500 MCG TABS Take 2,500 mcg by mouth daily.   famotidine  (PEPCID ) 20 MG tablet One at bedtime   ibuprofen (ADVIL) 200 MG tablet Take 400 mg by mouth every 6 (six) hours as needed for moderate pain.   ipratropium-albuterol  (DUONEB) 0.5-2.5 (3) MG/3ML SOLN Take 3 mLs by nebulization every 4 (four) hours.  Dx: J44.9   Multiple Minerals-Vitamins (CAL MAG ZINC  +D3) TABS Take 1-2 tablets by mouth See admin instructions. Take 1 tablet in the morning and 2 tablets in the evening   OXYGEN  2lpm with sleep   Respiratory Therapy Supplies (FLUTTER) DEVI 1 Device by Does not apply route as needed.   valACYclovir  (VALTREX ) 1000 MG tablet Take 500 mg by mouth every other day.     Physical Exam:    VS:  BP 130/64   Pulse 73   Ht 5' 5 (1.651 m)   Wt 164 lb 9.6 oz (74.7 kg)   SpO2 100%   BMI 27.39 kg/m    Wt Readings from Last 3 Encounters:  08/04/23 164 lb 9.6 oz (74.7 kg)  07/29/23 164 lb 6.4 oz (74.6 kg)  04/07/23 165 lb 12.8 oz (75.2 kg)    GEN: Well nourished, well developed in no acute distress NECK: No JVD; No carotid bruits CARDIAC: RRR, no murmurs, rubs, gallops RESPIRATORY:  Clear to auscultation without rales, wheezing or rhonchi  ABDOMEN: Soft, non-tender, non-distended EXTREMITIES:  No edema; No acute deformity   Asessement and Plan:.    1. Lower extremity edema - occurred earlier in the summer, enjoys being outside. With normal pro BNP and albumin, suspect related to some venous insufficiency. Will obtain 2d echocardiogram to evaluate LV/RV given chronic hypoxic respiratory failure. Note she has chronically elevated CO2 likely in setting of her lung disease. Given significant improvement in symptoms with conservative management, hold off standing diuretic therapy, but can consider some low dose PRN Lasix in the future if this recurs. No signs of VTE on exam today. Have encouraged continued use of compression hose and lower sodium diet.   2. Hx of chronic diastolic CHF - noted historically in Dr. Allena notes though doesn't seem to have been a significant issue during her care here. Not on standing diuretic, commentary as above. Post visit I went back in the records to determine why on bisoprolol . Looks like this was prescribed for remote palpitations and she has done well ever since.  Occasional PVCs noted on EKG, rare on auscultation. Has iRBBB+LAFB on EKG which is stable. F/u echocardiogram as planned.  3. Mild CAD, Hyperlipidemia intolerant of statins - no accelerating angina or dyspnea. Continue low dose ASA. Anemia is followed by PCP and cancer center. She has been intolerant of statins and has not wished to pursue alternative options for lipid management.  Disposition: F/u with Dr. Sheena or me in 3 months.  Signed, Geanine Vandekamp N Marzell Isakson, PA-C

## 2023-08-04 ENCOUNTER — Encounter: Payer: Self-pay | Admitting: Physician Assistant

## 2023-08-04 ENCOUNTER — Ambulatory Visit: Attending: Physician Assistant | Admitting: Physician Assistant

## 2023-08-04 VITALS — BP 130/64 | HR 73 | Ht 65.0 in | Wt 164.6 lb

## 2023-08-04 DIAGNOSIS — R600 Localized edema: Secondary | ICD-10-CM

## 2023-08-04 DIAGNOSIS — E785 Hyperlipidemia, unspecified: Secondary | ICD-10-CM

## 2023-08-04 DIAGNOSIS — I2510 Atherosclerotic heart disease of native coronary artery without angina pectoris: Secondary | ICD-10-CM

## 2023-08-04 DIAGNOSIS — I5032 Chronic diastolic (congestive) heart failure: Secondary | ICD-10-CM | POA: Diagnosis not present

## 2023-08-04 DIAGNOSIS — Z87898 Personal history of other specified conditions: Secondary | ICD-10-CM

## 2023-08-04 DIAGNOSIS — Z9981 Dependence on supplemental oxygen: Secondary | ICD-10-CM | POA: Diagnosis not present

## 2023-08-04 NOTE — Patient Instructions (Signed)
 Medication Instructions:  Your physician recommends that you continue on your current medications as directed. Please refer to the Current Medication list given to you today.  *If you need a refill on your cardiac medications before your next appointment, please call your pharmacy*  Lab Work: None ordered  If you have labs (blood work) drawn today and your tests are completely normal, you will receive your results only by: MyChart Message (if you have MyChart) OR A paper copy in the mail If you have any lab test that is abnormal or we need to change your treatment, we will call you to review the results.  Testing/Procedures: Your physician has requested that you have an echocardiogram. Echocardiography is a painless test that uses sound waves to create images of your heart. It provides your doctor with information about the size and shape of your heart and how well your heart's chambers and valves are working. This procedure takes approximately one hour. There are no restrictions for this procedure. Please do NOT wear cologne, perfume, aftershave, or lotions (deodorant is allowed). Please arrive 15 minutes prior to your appointment time.  Please note: We ask at that you not bring children with you during ultrasound (echo/ vascular) testing. Due to room size and safety concerns, children are not allowed in the ultrasound rooms during exams. Our front office staff cannot provide observation of children in our lobby area while testing is being conducted. An adult accompanying a patient to their appointment will only be allowed in the ultrasound room at the discretion of the ultrasound technician under special circumstances. We apologize for any inconvenience.   Follow-Up: At [facility], you and your health needs are our priority.  As part of our continuing mission to provide you with exceptional heart care, our providers are all part of one team.  This team includes your primary  Cardiologist (physician) and Advanced Practice Providers or APPs (Physician Assistants and Nurse Practitioners) who all work together to provide you with the care you need, when you need it.  Your next appointment:   3 month(s)  Provider:   Kardie Tobb, DO or Dayna Dunn, PA-C          We recommend signing up for the patient portal called MyChart.  Sign up information is provided on this After Visit Summary.  MyChart is used to connect with patients for Virtual Visits (Telemedicine).  Patients are able to view lab/test results, encounter notes, upcoming appointments, etc.  Non-urgent messages can be sent to your provider as well.   To learn more about what you can do with MyChart, go to [email or web address].   Other Instructions

## 2023-08-09 ENCOUNTER — Other Ambulatory Visit: Payer: Self-pay | Admitting: Nurse Practitioner

## 2023-08-09 DIAGNOSIS — C3491 Malignant neoplasm of unspecified part of right bronchus or lung: Secondary | ICD-10-CM

## 2023-08-09 NOTE — Progress Notes (Unsigned)
 Patient Care Team: Chrystal Lamarr RAMAN, MD as PCP - General (Family Medicine) Tobb, Kardie, DO as PCP - Cardiology (Cardiology) Humberto Charmaine HERO, DMD (Dentistry) Humberto Charmaine HERO, DMD (Dentistry) Darlean Ozell NOVAK, MD as Consulting Physician (Pulmonary Disease) Dunn, Dayna N, PA-C as Physician Assistant (Cardiology)  Clinic Day:  08/10/2023  Referring physician: Chrystal Lamarr RAMAN, *  ASSESSMENT & PLAN:   Assessment & Plan: Adenocarcinoma of right lung, stage 2 (HCC) Unresectable stage IIb (T1c, No/N1, M0) non-small cell lung cancer, adenocarcinoma.  The patient presented with right upper lobe groundglass lesion and hypermetabolic right hilar lymph node.  She was diagnosed in September 2023. The patient underwent a course of concurrent chemoradiation with weekly carboplatin  for AUC of 2 and paclitaxel  45 Mg/M2 status post 7 cycle. She has a rough time tolerating the previous treatment with increasing fatigue and weakness. He is currently on observation and feeling fine except for the baseline shortness of breath. The patient had repeat CT scan of the chest performed recently.  Dr. Sherrod personally and independently reviewed the scan and discussed the result with the patient and her daughter.  Her scan showed no concerning findings for disease progression.    Stage IIB Non-Small Cell Lung Cancer (NSCLC) Diagnosed in September 2023 with unresectable stage IIB NSCLC, specifically adenocarcinoma. Completed seven weeks of chemotherapy and radiation, resulting in lung tissue scarring. Currently under observation with no new growths or significant changes since the last CT scan in July 2024. Reports shortness of breath and cognitive impairment. CT chest with contrast from 07/12/2023 showed no new or enlarging lung masses or suspicious lung nodules.  - Continue observation every six months - Perform CT scan and lab work one week before each follow-up visit - Maintain current routine until  five years of stability -Plan was reviewed by Dr. Sherrod and myself, with the patient and her daughter.     COPD  Patient does have underlying COPD. She uses Breztri  maintenance inhaler twice daily and has a rescue inhaler and nebulizer treatments to use as needed. She does have oxygen  requirement of 3 lpm. Her saturations are good at 96% with 3 LPM of O2.  She is followed routinely by pulmonology.  Mild anemia Patient's Hgb 10.7 and HCT 33.3.  These values appear stable and baseline for patient.  She inquired about adding iron supplement to improve anemia.  Discussed addition of Ferrusol daily if desired..  Continue with vitamin C as this does help with absorption of iron supplement.  Will continue to monitor every 6 months.  Plan Today's visit was shared with Dr. Sherrod. Labs from 07/12/2023 reviewed with patient. - Mild and stable anemia with Hgb 10.7 and HCT 33.3.  She is able to take oral iron supplement daily.  Continue vitamin C daily.  Recheck at 37-month follow-up. - CMP unremarkable. Reviewed CT chest from 07/12/2023.  This shows no new or enlarging lung masses or suspicious lung nodules.  Plan to repeat scan in 6 months for surveillance. Exam stable. Labs and follow-up in 6 months.  CT chest for surveillance, 1 week prior.   The patient understands the plans discussed today and is in agreement with them.  She knows to contact our office if she develops concerns prior to her next appointment.  I provided 25 minutes of face-to-face time during this encounter and > 50% was spent counseling as documented under my assessment and plan.    Powell FORBES Lessen, NP  [facility] - A DEPT OF [facility] [address] [place] [address] Dept: [phone] Dept Fax: [phone]   Orders Placed This Encounter  Procedures   CT Chest W Contrast    Standing Status:   Future    Expected Date:   02/10/2024    Expiration  Date:   08/09/2024    If indicated for the ordered procedure, I authorize the administration of contrast media per Radiology protocol:   Yes    Does the patient have a contrast media/X-ray dye allergy?:   No    Preferred imaging location?:   [facility]      CHIEF COMPLAINT:  CC: Follow-up lung cancer  Current Treatment: Observation  INTERVAL HISTORY:  Joanne Rogers is here today for repeat clinical assessment.  She was last seen by Dr. Sherrod on 01/19/2023.  If she had CT chest with contrast on 07/12/2023.  There were no new or enlarging lung masses or suspicious lung nodules.  The patient states she has been feeling well since her last visit.  Does have chronic shortness of breath due to COPD.  Using 3 L nasal cannula oxygen  at all times.  She denies chest pain, chest pressure, or unusual shortness of breath. She denies headaches or visual disturbances. She denies abdominal pain, nausea, vomiting, or changes in bowel or bladder habits.  She denies fevers or chills. She denies pain. Her appetite is good. Her weight has decreased 2 pounds over last 6 months.  I have reviewed the past medical history, past surgical history, social history and family history with the patient and they are unchanged from previous note.  ALLERGIES:  is allergic to codeine, amoxicillin , latex, levofloxacin, statins, and wound dressing adhesive.  MEDICATIONS:  Current Outpatient Medications  Medication Sig Dispense Refill   acetaminophen  (TYLENOL ) 500 MG tablet Take 1,000 mg by mouth every 6 (six) hours as needed for moderate pain.     albuterol  (VENTOLIN  HFA) 108 (90 Base) MCG/ACT inhaler Inhale 1-2 puffs into the lungs every 4 (four) hours as needed for wheezing or shortness of breath. 1 each 5   ascorbic acid (VITAMIN C) 500 MG tablet Take 500 mg by mouth daily.     aspirin 81 MG tablet Take 81 mg by mouth at bedtime.     azelastine (ASTELIN) 0.1 % nasal spray Place 1 spray into both nostrils as needed for  rhinitis.     beta carotene w/minerals (OCUVITE) tablet Take 1 tablet by mouth at bedtime.     bisoprolol  (ZEBETA ) 5 MG tablet TAKE 1 TABLET EVERY DAY 90 tablet 3   budesonide -glycopyrrolate -formoterol  (BREZTRI  AEROSPHERE) 160-9-4.8 MCG/ACT AERO inhaler Inhale 2 puffs into the lungs in the morning and at bedtime.     cetirizine  (ZYRTEC ) 10 MG tablet Take 10 mg by mouth at bedtime.     cholecalciferol (VITAMIN D3) 25 MCG (1000 UNIT) tablet Take 1,000 Units by mouth daily.     Cyanocobalamin  (B-12) 2500 MCG TABS Take 2,500 mcg by mouth daily.     famotidine  (PEPCID ) 20 MG tablet One at bedtime 30 tablet 11   ibuprofen (ADVIL) 200 MG tablet Take 400 mg by mouth every 6 (six) hours as needed for moderate pain.     ipratropium-albuterol  (DUONEB) 0.5-2.5 (3) MG/3ML SOLN Take 3 mLs by nebulization every 4 (four) hours. Dx: J44.9 360 mL 1   Multiple Minerals-Vitamins (CAL MAG ZINC  +D3) TABS Take 1-2 tablets by mouth See admin instructions. Take 1 tablet in the morning  and 2 tablets in the evening     OXYGEN  2lpm with sleep     Respiratory Therapy Supplies (FLUTTER) DEVI 1 Device by Does not apply route as needed. 1 each 0   valACYclovir  (VALTREX ) 1000 MG tablet Take 500 mg by mouth every other day.      No current facility-administered medications for this visit.    HISTORY OF PRESENT ILLNESS:   Oncology History  Adenocarcinoma of right lung, stage 2 (HCC)  10/16/2021 Initial Diagnosis   Adenocarcinoma of right lung, stage 2 (HCC)   10/27/2021 - 12/08/2021 Chemotherapy   Patient is on Treatment Plan : LUNG Carboplatin  + Paclitaxel  + XRT q7d     07/12/2023 Imaging   CT chest with contrast  IMPRESSION: 1. Stable exam. No new or enlarging lung mass or suspicious lung nodule. 2. Multiple other nonacute observations, as described above       REVIEW OF SYSTEMS:   Constitutional: Denies fevers, chills or abnormal weight loss Eyes: Denies blurriness of vision Ears, nose, mouth, throat, and  face: Denies mucositis or sore throat Respiratory: Denies cough or wheezes.  She does have baseline shortness of breath and oxygen  requirement of 3 L/min per nasal cannula. Cardiovascular: Denies palpitation, chest discomfort or lower extremity swelling Gastrointestinal:  Denies nausea, heartburn or change in bowel habits Skin: Denies abnormal skin rashes Lymphatics: Denies new lymphadenopathy or easy bruising Neurological:Denies numbness, tingling or new weaknesses Behavioral/Psych: Mood is stable, no new changes  All other systems were reviewed with the patient and are negative.   VITALS:   Today's Vitals   08/10/23 0857  BP: (!) 118/56  Pulse: 73  Temp: 98.6 F (37 C)  TempSrc: Temporal  SpO2: 96%  Weight: 162 lb 9.6 oz (73.8 kg)  Height: 5' 5 (1.651 m)  PF: (!) 3 L/min  PainSc: 0-No pain   Body mass index is 27.06 kg/m.   Wt Readings from Last 3 Encounters:  08/10/23 162 lb 9.6 oz (73.8 kg)  08/04/23 164 lb 9.6 oz (74.7 kg)  07/29/23 164 lb 6.4 oz (74.6 kg)    Body mass index is 27.06 kg/m.  Performance status (ECOG): 1 - Symptomatic but completely ambulatory  PHYSICAL EXAM:   GENERAL:alert, no distress and comfortable SKIN: skin color, texture, turgor are normal, no rashes or significant lesions EYES: normal, Conjunctiva are pink and non-injected, sclera clear OROPHARYNX:no exudate, no erythema and lips, buccal mucosa, and tongue normal  NECK: supple, thyroid  normal size, non-tender, without nodularity LYMPH:  no palpable lymphadenopathy in the cervical, axillary or inguinal LUNGS: clear to auscultation and percussion with normal breathing effort.  Breath sounds are slightly diminished throughout the lung fields.  Quiet wheezing heard in left upper lobe of the lungs. HEART: regular rate & rhythm and no murmurs and no lower extremity edema ABDOMEN:abdomen soft, non-tender and normal bowel sounds Musculoskeletal:no cyanosis of digits and no clubbing  NEURO:  alert & oriented x 3 with fluent speech, no focal motor/sensory deficits  LABORATORY DATA:  I have reviewed the data as listed    Component Value Date/Time   NA 139 07/12/2023 0953   NA 137 05/08/2020 0909   K 4.3 07/12/2023 0953   CL 100 07/12/2023 0953   CO2 35 (H) 07/12/2023 0953   GLUCOSE 122 (H) 07/12/2023 0953   BUN 13 07/12/2023 0953   BUN 8 05/08/2020 0909   CREATININE 0.50 07/12/2023 0953   CALCIUM 10.1 07/12/2023 0953   PROT 7.2 07/12/2023 0953   ALBUMIN 4.1  07/12/2023 0953   AST 18 07/12/2023 0953   ALT 18 07/12/2023 0953   ALKPHOS 86 07/12/2023 0953   BILITOT 0.4 07/12/2023 0953   GFRNONAA >60 07/12/2023 0953   GFRAA 101 11/14/2019 0000    Lab Results  Component Value Date   WBC 7.9 07/12/2023   NEUTROABS 6.0 07/12/2023   HGB 10.7 (L) 07/12/2023   HCT 33.3 (L) 07/12/2023   MCV 97.7 07/12/2023   PLT 207 07/12/2023    RADIOGRAPHIC STUDIES: CT Chest W Contrast Result Date: 07/12/2023 CLINICAL DATA:  Non-small cell lung cancer (NSCLC), staging. * Tracking Code: BO * EXAM: CT CHEST WITH CONTRAST TECHNIQUE: Multidetector CT imaging of the chest was performed during intravenous contrast administration. RADIATION DOSE REDUCTION: This exam was performed according to the departmental dose-optimization program which includes automated exposure control, adjustment of the mA and/or kV according to patient size and/or use of iterative reconstruction technique. CONTRAST:  75mL OMNIPAQUE  IOHEXOL  300 MG/ML  SOLN COMPARISON:  CT scan chest from 01/15/2023. FINDINGS: Cardiovascular: Normal cardiac size. No pericardial effusion. No aortic aneurysm. There are coronary artery calcifications, in keeping with coronary artery disease. There are also moderate peripheral atherosclerotic vascular calcifications of thoracic aorta and its major branches. Mediastinum/Nodes: Redemonstration of heterogeneous thyroid  gland containing a 1.1 x 1.6 cm hypoattenuating nodule in the right posterior  thyroid  lobe, incompletely characterized on the current exam but grossly similar to the prior study. Please refer to ultrasound thyroid  from 07/28/2022 for details. No solid / cystic mediastinal masses. The esophagus is nondistended precluding optimal assessment. There are few mildly prominent mediastinal and hilar lymph nodes, which do not meet the size criteria for lymphadenopathy and appear grossly similar to the prior study. No axillary lymphadenopathy by size criteria. Lungs/Pleura: The central tracheo-bronchial tree is patent. Redemonstration of irregular heterogeneous soft tissue surrounding the right upper lobe central bronchial tree which is essentially unchanged since the prior study. There are also stable linear areas of scarring/atelectasis in the right upper lobe, superior segment of right lower lobe and lingular segment of left upper lobe, unchanged. No mass or consolidation. No pleural effusion or pneumothorax. No new or suspicious lung nodules. Previously described faint ground-glass nodule in the middle lobe is unchanged (series 6, image 72). Upper Abdomen: There are at least 4, ill-defined hypoattenuating foci in the left hepatic lobe with largest measuring up to 1.0 x 1.4 cm, which can be characterized as a simple cyst. However, other lesions are too small to adequately characterize but favored to represent cysts as well. Remaining visualized upper abdominal viscera within normal limits. Musculoskeletal: The visualized soft tissues of the chest wall are grossly unremarkable. No suspicious osseous lesions. There are mild multilevel degenerative changes in the visualized spine. IMPRESSION: 1. Stable exam. No new or enlarging lung mass or suspicious lung nodule. 2. Multiple other nonacute observations, as described above. Aortic Atherosclerosis (ICD10-I70.0). Electronically Signed   By: Ree Molt M.D.   On: 07/12/2023 11:54   ADDENDUM: Hematology/Oncology Attending:  I had a face-to-face  encounter with the patient today.  I reviewed her records, lab, scan and recommended her care plan.  This is a very pleasant 78 years old white female with unresectable stage IIb non-small cell lung cancer, adenocarcinoma diagnosed in September 2023 status post a course of concurrent chemoradiation with weekly carboplatin  and paclitaxel  completed in November 2023 and she has been in observation since that time.  The patient is feeling fine today with no concerning complaints except for mild fatigue and shortness  of breath increased with exertion. She had repeat CT scan of the chest performed recently.  I personally and independently reviewed the scan and discussed the result with the patient today.  Her scan showed no concerning findings for disease recurrence or metastasis. I recommended for her to continue on observation with repeat CT scan of the chest in 6 months. She was advised to call immediately if she has any concerning symptoms in the interval. The total time spent in the appointment was 20 minutes including review of chart and various tests results, discussions about plan of care and coordination of care plan . Disclaimer: This note was dictated with voice recognition software. Similar sounding words can inadvertently be transcribed and may be missed upon review. Sherrod MARLA Sherrod, MD

## 2023-08-09 NOTE — Assessment & Plan Note (Signed)
 Unresectable stage IIb (T1c, No/N1, M0) non-small cell lung cancer, adenocarcinoma.  The patient presented with right upper lobe groundglass lesion and hypermetabolic right hilar lymph node.  She was diagnosed in September 2023. The patient underwent a course of concurrent chemoradiation with weekly carboplatin  for AUC of 2 and paclitaxel  45 Mg/M2 status post 7 cycle. She has a rough time tolerating the previous treatment with increasing fatigue and weakness. He is currently on observation and feeling fine except for the baseline shortness of breath. The patient had repeat CT scan of the chest performed recently.  I personally and independently reviewed the scan and discussed the result with the patient and her daughter.  Her scan showed no concerning findings for disease progression.    Stage IIB Non-Small Cell Lung Cancer (NSCLC) Diagnosed in September 2023 with unresectable stage IIB NSCLC, specifically adenocarcinoma. Completed seven weeks of chemotherapy and radiation, resulting in lung tissue scarring. Currently under observation with no new growths or significant changes since the last CT scan in July 2024. Reports shortness of breath and cognitive impairment. - Continue observation every six months - Perform CT scan and lab work one week before each follow-up visit - Maintain current routine until five years of stability

## 2023-08-10 ENCOUNTER — Encounter: Payer: Self-pay | Admitting: Nurse Practitioner

## 2023-08-10 ENCOUNTER — Inpatient Hospital Stay: Attending: Internal Medicine | Admitting: Nurse Practitioner

## 2023-08-10 VITALS — BP 118/56 | HR 73 | Temp 98.6°F | Ht 65.0 in | Wt 162.6 lb

## 2023-08-10 DIAGNOSIS — C3411 Malignant neoplasm of upper lobe, right bronchus or lung: Secondary | ICD-10-CM | POA: Insufficient documentation

## 2023-08-10 DIAGNOSIS — J449 Chronic obstructive pulmonary disease, unspecified: Secondary | ICD-10-CM | POA: Insufficient documentation

## 2023-08-10 DIAGNOSIS — I700 Atherosclerosis of aorta: Secondary | ICD-10-CM | POA: Diagnosis not present

## 2023-08-10 DIAGNOSIS — C3491 Malignant neoplasm of unspecified part of right bronchus or lung: Secondary | ICD-10-CM | POA: Diagnosis not present

## 2023-08-10 DIAGNOSIS — Z79624 Long term (current) use of inhibitors of nucleotide synthesis: Secondary | ICD-10-CM | POA: Insufficient documentation

## 2023-08-10 DIAGNOSIS — Z923 Personal history of irradiation: Secondary | ICD-10-CM | POA: Insufficient documentation

## 2023-08-10 DIAGNOSIS — R4189 Other symptoms and signs involving cognitive functions and awareness: Secondary | ICD-10-CM | POA: Insufficient documentation

## 2023-08-10 DIAGNOSIS — Z7982 Long term (current) use of aspirin: Secondary | ICD-10-CM | POA: Diagnosis not present

## 2023-08-10 DIAGNOSIS — D649 Anemia, unspecified: Secondary | ICD-10-CM | POA: Diagnosis not present

## 2023-08-10 DIAGNOSIS — Z79899 Other long term (current) drug therapy: Secondary | ICD-10-CM | POA: Diagnosis not present

## 2023-08-10 DIAGNOSIS — Z9981 Dependence on supplemental oxygen: Secondary | ICD-10-CM | POA: Diagnosis not present

## 2023-08-10 DIAGNOSIS — Z9221 Personal history of antineoplastic chemotherapy: Secondary | ICD-10-CM | POA: Diagnosis not present

## 2023-08-10 NOTE — Addendum Note (Signed)
 Addended by: SHERROD SHERROD on: 08/10/2023 08:51 PM   Modules accepted: Level of Service

## 2023-08-29 DIAGNOSIS — J449 Chronic obstructive pulmonary disease, unspecified: Secondary | ICD-10-CM | POA: Diagnosis not present

## 2023-09-04 DIAGNOSIS — Z9981 Dependence on supplemental oxygen: Secondary | ICD-10-CM | POA: Diagnosis not present

## 2023-09-08 DIAGNOSIS — R413 Other amnesia: Secondary | ICD-10-CM | POA: Diagnosis not present

## 2023-09-08 DIAGNOSIS — E538 Deficiency of other specified B group vitamins: Secondary | ICD-10-CM | POA: Diagnosis not present

## 2023-09-08 DIAGNOSIS — E7800 Pure hypercholesterolemia, unspecified: Secondary | ICD-10-CM | POA: Diagnosis not present

## 2023-09-08 DIAGNOSIS — R7301 Impaired fasting glucose: Secondary | ICD-10-CM | POA: Diagnosis not present

## 2023-09-10 DIAGNOSIS — E7800 Pure hypercholesterolemia, unspecified: Secondary | ICD-10-CM | POA: Diagnosis not present

## 2023-09-10 DIAGNOSIS — I700 Atherosclerosis of aorta: Secondary | ICD-10-CM | POA: Diagnosis not present

## 2023-09-10 DIAGNOSIS — J9611 Chronic respiratory failure with hypoxia: Secondary | ICD-10-CM | POA: Diagnosis not present

## 2023-09-10 DIAGNOSIS — R413 Other amnesia: Secondary | ICD-10-CM | POA: Diagnosis not present

## 2023-09-10 DIAGNOSIS — D499 Neoplasm of unspecified behavior of unspecified site: Secondary | ICD-10-CM | POA: Diagnosis not present

## 2023-09-10 DIAGNOSIS — Z23 Encounter for immunization: Secondary | ICD-10-CM | POA: Diagnosis not present

## 2023-09-10 DIAGNOSIS — I5032 Chronic diastolic (congestive) heart failure: Secondary | ICD-10-CM | POA: Diagnosis not present

## 2023-09-10 DIAGNOSIS — R7301 Impaired fasting glucose: Secondary | ICD-10-CM | POA: Diagnosis not present

## 2023-09-10 DIAGNOSIS — E538 Deficiency of other specified B group vitamins: Secondary | ICD-10-CM | POA: Diagnosis not present

## 2023-09-10 DIAGNOSIS — Z0000 Encounter for general adult medical examination without abnormal findings: Secondary | ICD-10-CM | POA: Diagnosis not present

## 2023-09-10 DIAGNOSIS — C3491 Malignant neoplasm of unspecified part of right bronchus or lung: Secondary | ICD-10-CM | POA: Diagnosis not present

## 2023-09-10 DIAGNOSIS — J449 Chronic obstructive pulmonary disease, unspecified: Secondary | ICD-10-CM | POA: Diagnosis not present

## 2023-09-14 ENCOUNTER — Ambulatory Visit (HOSPITAL_COMMUNITY)
Admission: RE | Admit: 2023-09-14 | Discharge: 2023-09-14 | Disposition: A | Discharge status: Home / Self Care | Source: Ambulatory Visit | Attending: Cardiology | Admitting: Cardiology

## 2023-09-14 DIAGNOSIS — R600 Localized edema: Secondary | ICD-10-CM | POA: Insufficient documentation

## 2023-09-14 DIAGNOSIS — I5032 Chronic diastolic (congestive) heart failure: Secondary | ICD-10-CM | POA: Insufficient documentation

## 2023-09-14 LAB — ECHOCARDIOGRAM COMPLETE
Area-P 1/2: 2.63 cm2
Est EF: 55
S' Lateral: 2.7 cm

## 2023-09-15 ENCOUNTER — Ambulatory Visit: Payer: Self-pay | Admitting: Physician Assistant

## 2023-09-29 DIAGNOSIS — J449 Chronic obstructive pulmonary disease, unspecified: Secondary | ICD-10-CM | POA: Diagnosis not present

## 2023-10-05 DIAGNOSIS — Z9981 Dependence on supplemental oxygen: Secondary | ICD-10-CM | POA: Diagnosis not present

## 2023-10-14 DIAGNOSIS — L820 Inflamed seborrheic keratosis: Secondary | ICD-10-CM | POA: Diagnosis not present

## 2023-10-26 DIAGNOSIS — Z1231 Encounter for screening mammogram for malignant neoplasm of breast: Secondary | ICD-10-CM | POA: Diagnosis not present

## 2023-10-27 ENCOUNTER — Encounter: Payer: Self-pay | Admitting: Emergency Medicine

## 2023-10-27 ENCOUNTER — Ambulatory Visit: Admitting: Emergency Medicine

## 2023-10-27 VITALS — BP 120/62 | HR 75 | Temp 98.0°F | Ht 65.5 in | Wt 152.0 lb

## 2023-10-27 DIAGNOSIS — C3491 Malignant neoplasm of unspecified part of right bronchus or lung: Secondary | ICD-10-CM | POA: Diagnosis not present

## 2023-10-27 DIAGNOSIS — J9612 Chronic respiratory failure with hypercapnia: Secondary | ICD-10-CM | POA: Diagnosis not present

## 2023-10-27 DIAGNOSIS — J9611 Chronic respiratory failure with hypoxia: Secondary | ICD-10-CM | POA: Diagnosis not present

## 2023-10-27 DIAGNOSIS — J700 Acute pulmonary manifestations due to radiation: Secondary | ICD-10-CM | POA: Diagnosis not present

## 2023-10-27 DIAGNOSIS — J449 Chronic obstructive pulmonary disease, unspecified: Secondary | ICD-10-CM | POA: Diagnosis not present

## 2023-10-27 NOTE — Progress Notes (Unsigned)
   Subjective:    Patient ID: Joanne Rogers, female    DOB: 06/19/1945, 78 y.o.   MRN: [id number]  COPD Her past medical history is significant for COPD.    ROV 04/07/2023 --Vonzella is 56 with severe COPD, history of right upper lobe adenocarcinoma that was treated by SBRT complicated by radiation pneumonitis (improved after prednisone ).  We have been following CT scans of the chest to follow infiltrates, nodular disease.  She has been managed on Breztri .  Unfortunately she had suspected influenza, she was treated w pred and doxycycline  for an AE by me. She is on 3L/min. She confirmed that she is on breztri . She is on zyrtec  prn, is not using any nasal sprays right now.   ROV 10/27/2023 --78 year old woman with a history of severe COPD, SBRT to a right upper lobe adenocarcinoma that was complicated by radiation pneumonitis, treated with prednisone  and then improved clinically and radiographically.  We have been following serial CT scans of the chest.  She feels ok, has been doing well. She is on 3L/min.  She has not had a flare since lasty time, no pred or abx. Uses her albuterol  about once a week. She has some mucous in the am to clear, none during the day.   Ct chest 07/12/2023 reviewed by me. Stable exam without any new infiltrates, nodules or lesions.  No new enlarging masses.    Review of Systems As per HPI     Objective:   Physical Exam Vitals:   10/27/23 1557  BP: 120/62  Pulse: 75  Temp: 98 F (36.7 C)  SpO2: 95%  Weight: 152 lb (68.9 kg)  Height: 5' 5.5 (1.664 m)    Gen: Pleasant, overweight elderly woman, chronically ill but in no distress,  normal affect.  In a wheelchair  ENT: No lesions,  mouth clear,  oropharynx clear, no postnasal drip  Neck: No JVD, no stridor  Lungs: No use of accessory muscles, distant bilaterally.  No crackles or wheezes  Cardiovascular: RRR, heart sounds normal, no murmur or gallops, no peripheral edem  Musculoskeletal: No deformities, no  cyanosis or clubbing  Neuro: alert, awake, non focal  Skin: Warm, no lesions or rash      Assessment & Plan:  COPD GOLD III Please continue your Breztri  2 puffs twice a day. Rinse and gargle after using.  Keep albuterol  available to use 2 puffs or 1 nebulizer treatment up to every 4 hours if needed for shortness of breath, chest tightness, wheezing.  Flu, covid, pneumonia vaccines are all up to date.  Follow in our office in 6 months Follow with Dr. Shelah in 12 months or sooner if you have any problems.   Chronic respiratory failure with hypoxia and hypercapnia (HCC) Wear your oxygen  at 3L/min at all times. Our goal is to keep your saturations above 90%  Adenocarcinoma of right lung, stage 2 [facility]) Get your repeat Ct chest with Dr Sherrod in December as planned.   Radiation pneumonitis Resolved with corticosteroids.  No evidence of recurrence on current imaging.  Continue to follow serial imaging     Lamar Shelah, MD, PhD 10/28/2023, 7:36 AM [facility] [phone] or if no answer [phone]

## 2023-10-27 NOTE — Patient Instructions (Signed)
 Please continue your Breztri  2 puffs twice a day. Rinse and gargle after using.  Keep albuterol  available to use 2 puffs or 1 nebulizer treatment up to every 4 hours if needed for shortness of breath, chest tightness, wheezing.  Flu, covid, pneumonia vaccines are all up to date.  Wear your oxygen  at 3L/min at all times. Our goal is to keep your saturations above 90% Get your repeat Ct chest with Dr Sherrod in December as planned.  Follow in our office in 6 months Follow with Dr. Shelah in 12 months or sooner if you have any problems.

## 2023-10-28 NOTE — Assessment & Plan Note (Addendum)
 Please continue your Breztri  2 puffs twice a day. Rinse and gargle after using.  Keep albuterol  available to use 2 puffs or 1 nebulizer treatment up to every 4 hours if needed for shortness of breath, chest tightness, wheezing.  Flu, covid, pneumonia vaccines are all up to date.  Follow in our office in 6 months Follow with Dr. Shelah in 12 months or sooner if you have any problems.

## 2023-10-28 NOTE — Assessment & Plan Note (Addendum)
 Resolved with corticosteroids.  No evidence of recurrence on current imaging.  Continue to follow serial imaging

## 2023-10-28 NOTE — Assessment & Plan Note (Signed)
 Wear your oxygen  at 3L/min at all times. Our goal is to keep your saturations above 90%

## 2023-10-28 NOTE — Assessment & Plan Note (Signed)
 Get your repeat Ct chest with Dr Sherrod in December as planned.

## 2023-10-28 NOTE — Assessment & Plan Note (Deleted)
 Please continue your Breztri  2 puffs twice a day. Rinse and gargle after using.  Keep albuterol  available to use 2 puffs or 1 nebulizer treatment up to every 4 hours if needed for shortness of breath, chest tightness, wheezing.  Flu, covid, pneumonia vaccines are all up to date.  Follow in our office in 6 months Follow with Dr. Shelah in 12 months or sooner if you have any problems.

## 2023-10-29 DIAGNOSIS — J449 Chronic obstructive pulmonary disease, unspecified: Secondary | ICD-10-CM | POA: Diagnosis not present

## 2023-11-04 DIAGNOSIS — Z9981 Dependence on supplemental oxygen: Secondary | ICD-10-CM | POA: Diagnosis not present

## 2023-11-15 ENCOUNTER — Encounter: Payer: Self-pay | Admitting: Physician Assistant

## 2023-11-15 ENCOUNTER — Ambulatory Visit: Attending: Physician Assistant | Admitting: Physician Assistant

## 2023-11-15 ENCOUNTER — Other Ambulatory Visit: Payer: Self-pay

## 2023-11-15 VITALS — BP 118/54 | HR 73 | Ht 65.5 in | Wt 162.2 lb

## 2023-11-15 DIAGNOSIS — I452 Bifascicular block: Secondary | ICD-10-CM | POA: Diagnosis not present

## 2023-11-15 DIAGNOSIS — I5032 Chronic diastolic (congestive) heart failure: Secondary | ICD-10-CM | POA: Diagnosis not present

## 2023-11-15 DIAGNOSIS — R002 Palpitations: Secondary | ICD-10-CM | POA: Diagnosis not present

## 2023-11-15 DIAGNOSIS — R600 Localized edema: Secondary | ICD-10-CM | POA: Diagnosis not present

## 2023-11-15 DIAGNOSIS — I2510 Atherosclerotic heart disease of native coronary artery without angina pectoris: Secondary | ICD-10-CM

## 2023-11-15 DIAGNOSIS — E785 Hyperlipidemia, unspecified: Secondary | ICD-10-CM

## 2023-11-15 MED ORDER — BREZTRI AEROSPHERE 160-9-4.8 MCG/ACT IN AERO: 2.0000 | INHALATION_SPRAY | Freq: Two times a day (BID) | RESPIRATORY_TRACT | 12 refills | Status: AC

## 2023-11-15 NOTE — Progress Notes (Signed)
 Cardiology Office Note    Date:  11/15/2023  ID:  Joanne Rogers, DOB 1945/12/07, MRN [id number] PCP:  Chrystal Lamarr RAMAN, MD  Cardiologist:  Dub Huntsman, DO  Electrophysiologist:  None   Chief Complaint: f/u edema  History of Present Illness: .    Joanne Rogers is a 78 y.o. female with visit-pertinent history of mild CAD, iRBBB/LAFB, occasional PVCs by EKG, HLD intolerant of statins/ezetimibe , arthritis, severe COPD with chronic respiratory failure on 3L home O2, RUL adenocarcinoma treated with SRT complicated by radiation pneumonitis, seen for evaluation of swelling. Coronary CT 2021 showed mild nonobstructive CAD, CAC 150, myocardial bridge in mLAD. Patient has deferred non-statin lipid lowering agents. She is followed by pulmonology. When she remotely saw Dr. Alveta she also carried hx of chronic diastolic CHF as she previously had G1DD. She is on bisoprolol  for history of palpitations. She was seen in July 2025 for increasing lower extremity edema during the summer weather. She had seen per PCP who checked labs with normal Cr, albumin, BNP. This resolved with nonpharmacologic intervention of elevation and compression. EKG showed NSR with occasional PVCs for which she was otherwise asymptomatic. 2D echo performed 09/2023 showed EF 55%, G1DD, mildly dilated LA, normal RV.  She is seen back for follow-up with her daughter. She has not had significant issues with recurrent LE edema. She denies any accelerating SOB from baseline COPD. No chest pain, palpitations, or syncope. Primary care has been following her cholesterol with last LDL of 200 in 08/2023 by [facility] as outlined in labs below.  Labwork independently reviewed: [facility] 08/2023 TChol 281, LDL 200, trig 122, HDL 69, A1c 5.9, Hgb 11.3, K 4.2, ALT wnl 06/2023 Hgb 10.7, plt 207 chronic appearing anemia, K 4.3, Cr 0.50, LFTs ok, CareEverywhere TSH wnl, BNP wnl (44) 08/2022 TSH ok, LDL 215, trig 154 08/2021 LDL 164, trig 129  ROS: .    Please see  the history of present illness.  All other systems are reviewed and otherwise negative.  Studies Reviewed: SABRA    EKG:  EKG is not ordered today  CV Studies: Cardiac studies reviewed are outlined and summarized above. Otherwise please see EMR for full report.   Current Reported Medications:.    Current Meds  Medication Sig   acetaminophen  (TYLENOL ) 500 MG tablet Take 1,000 mg by mouth every 6 (six) hours as needed for moderate pain.   albuterol  (VENTOLIN  HFA) 108 (90 Base) MCG/ACT inhaler Inhale 1-2 puffs into the lungs every 4 (four) hours as needed for wheezing or shortness of breath.   ascorbic acid (VITAMIN C) 500 MG tablet Take 500 mg by mouth daily.   aspirin 81 MG tablet Take 81 mg by mouth at bedtime.   azelastine (ASTELIN) 0.1 % nasal spray Place 1 spray into both nostrils as needed for rhinitis.   beta carotene w/minerals (OCUVITE) tablet Take 1 tablet by mouth at bedtime.   bisoprolol  (ZEBETA ) 5 MG tablet TAKE 1 TABLET EVERY DAY   budesonide -glycopyrrolate -formoterol  (BREZTRI  AEROSPHERE) 160-9-4.8 MCG/ACT AERO inhaler Inhale 2 puffs into the lungs in the morning and at bedtime.   cetirizine  (ZYRTEC ) 10 MG tablet Take 10 mg by mouth at bedtime.   cholecalciferol (VITAMIN D3) 25 MCG (1000 UNIT) tablet Take 1,000 Units by mouth daily.   Cyanocobalamin  (B-12) 2500 MCG TABS Take 2,500 mcg by mouth daily.   famotidine  (PEPCID ) 20 MG tablet One at bedtime   ibuprofen (ADVIL) 200 MG tablet Take 400 mg by mouth every 6 (six)  hours as needed for moderate pain.   ipratropium-albuterol  (DUONEB) 0.5-2.5 (3) MG/3ML SOLN Take 3 mLs by nebulization every 4 (four) hours. Dx: J44.9   Multiple Minerals-Vitamins (CAL MAG ZINC  +D3) TABS Take 1-2 tablets by mouth See admin instructions. Take 1 tablet in the morning and 2 tablets in the evening   OXYGEN  2lpm with sleep   Respiratory Therapy Supplies (FLUTTER) DEVI 1 Device by Does not apply route as needed.   valACYclovir  (VALTREX ) 1000 MG tablet  Take 500 mg by mouth every other day.     Physical Exam:    VS:  BP (!) 118/54   Pulse 73   Ht 5' 5.5 (1.664 m)   Wt 162 lb 3.2 oz (73.6 kg)   SpO2 96%   PF (!) 4 L/min   BMI 26.58 kg/m    Wt Readings from Last 3 Encounters:  11/15/23 162 lb 3.2 oz (73.6 kg)  10/27/23 152 lb (68.9 kg)  08/10/23 162 lb 9.6 oz (73.8 kg)    GEN: Well nourished, well developed in no acute distress NECK: No JVD; No carotid bruits CARDIAC: RRR, no murmurs, rubs, gallops RESPIRATORY:  Diffusely diminished but no rales, wheezing or rhonchi  ABDOMEN: Soft, non-tender, non-distended EXTREMITIES:  No edema; No acute deformity   Asessement and Plan:.    1. Lower extremity edema - occurred earlier in the summer during the heat, suspect related to venous insufficiency given normal pBNP and albumin at that time. 2D echo with reassuring LV/RV function. She has not required diuretic prescription. She is doing a good job keeping her legs elevated when she's been on her feet during the day. She has also been wearing some hose. Will continue to follow clinically.  2. Chronic HFpEF - noted historically in Dr. Allena notes though doesn't seem to have been a significant issue during her care here. Not on standing diuretic, commentary as above. Had mild diastolic dysfunction on echocardiogram. Follow clinically.  3. Palpitations, occasional PVCs, iRBBB+LAFB - historically on bisoprolol  for remote palpitations. Occasional PVCs noted on EKG last OV, quiescent on exam today. She has not had any symptoms to suggest bradycardia or worsening respiratory status. No wheezing today. Continue bisoprolol  at present dose.  4. Mild CAD with intolerant to statins uninterested in alternative options for therapy - no accelerating angina or dyspnea. Continue aspirin 81mg  daily. Anemia has been followed by PCP/cancer center, last Hgb stable in 08/2023. She has been intolerant of statins and ezetimibe  per chart review. Her LDL is at a level  that I might expect familial hyperlipidemia. I gave her literature today to consider PCSK9 inhibitors. Per shared decision making we will place a referral to pharmD to discuss and she will call to schedule if they decide to move forward. I also recommended that family members get cholesterol checked.    Disposition: F/u with Dr. Sheena or me in 6 months.  Signed, Eloy Fehl N Idaliz Tinkle, PA-C

## 2023-11-15 NOTE — Patient Instructions (Addendum)
 Medication Instructions:  Your physician recommends that you continue on your current medications as directed. Please refer to the Current Medication list given to you today.  *If you need a refill on your cardiac medications before your next appointment, please call your pharmacy*  Lab Work: None ordered.  If you have labs (blood work) drawn today and your tests are completely normal, you will receive your results only by: MyChart Message (if you have MyChart) OR A paper copy in the mail If you have any lab test that is abnormal or we need to change your treatment, we will call you to review the results.  Testing/Procedures: None ordered.   Follow-Up: At [facility], you and your health needs are our priority.  As part of our continuing mission to provide you with exceptional heart care, our providers are all part of one team.  This team includes your primary Cardiologist (physician) and Advanced Practice Providers or APPs (Physician Assistants and Nurse Practitioners) who all work together to provide you with the care you need, when you need it.  Your next appointment:   6 months with Dayna Dunn, PA-C or Dr Sheena  We recommend signing up for the patient portal called MyChart.  Sign up information is provided on this After Visit Summary.  MyChart is used to connect with patients for Virtual Visits (Telemedicine).  Patients are able to view lab/test results, encounter notes, upcoming appointments, etc.  Non-urgent messages can be sent to your provider as well.   To learn more about what you can do with MyChart, go to [email or web address].   Other Instructions Referral to Lipid clinic placed

## 2023-11-15 NOTE — Telephone Encounter (Signed)
 Breztri  rx faxed to [facility].

## 2023-11-18 IMAGING — CT CT CHEST SUPER D W/O CM
2 of 4 series · 15 of 36 positions shown, 18 images · non-contrast
Comparison: Multiple priors including most recent CT June 17, 2020

CLINICAL DATA: Follow-up pulmonary nodules.

EXAM:
CT CHEST WITHOUT CONTRAST
TECHNIQUE: Multidetector CT imaging of the chest was performed using thin slice
collimation for electromagnetic bronchoscopy planning purposes,
without intravenous contrast.
RADIATION DOSE REDUCTION: This exam was performed according to the
departmental dose-optimization program which includes automated
exposure control, adjustment of the mA and/or kV according to
patient size and/or use of iterative reconstruction technique.

[Series 3: super d · axial · 0.64mm/px · z∈[-312,-20]mm · 12 of 401 slices shown, 15 images]
[im 18/401  mediastinal]
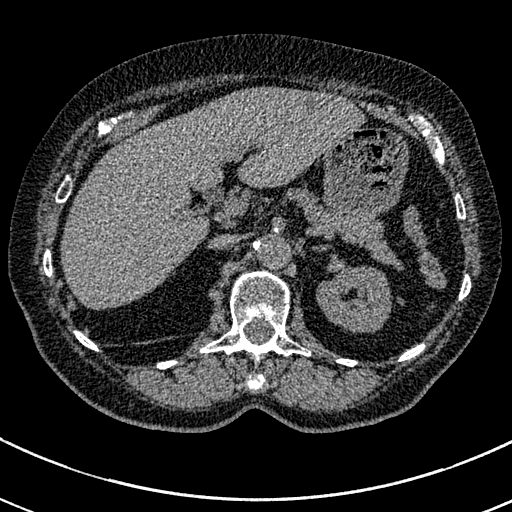
[im 18/401  lung]
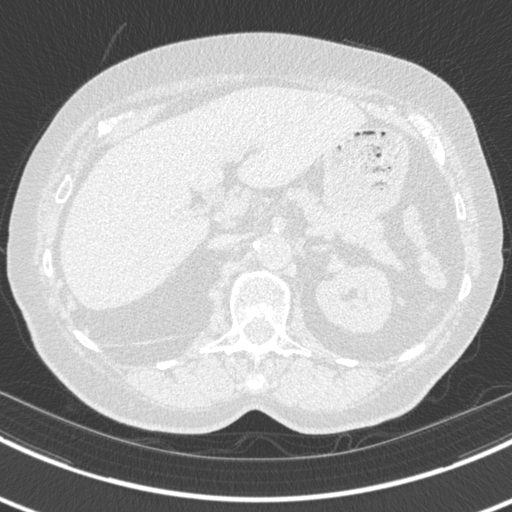
[im 53/401  lung]
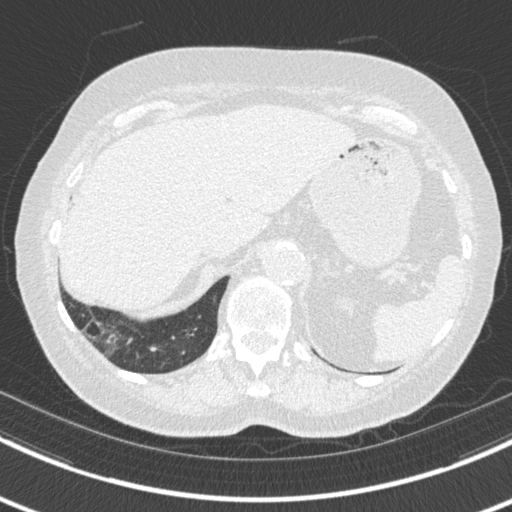
[im 87/401  lung]
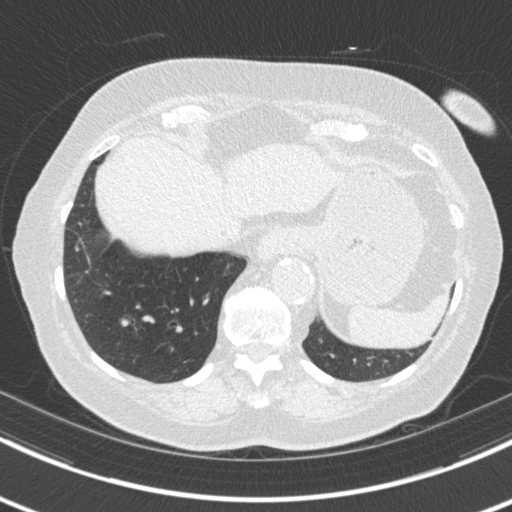
[im 122/401  lung]
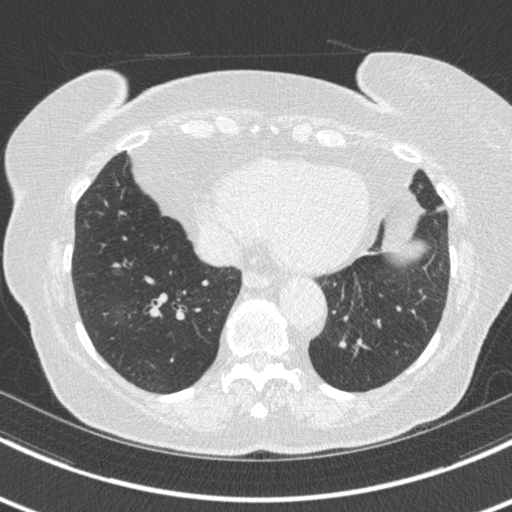
[im 157/401  mediastinal]
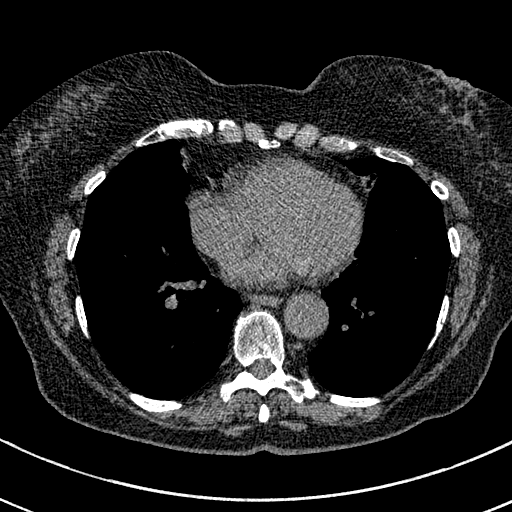
[im 157/401  lung]
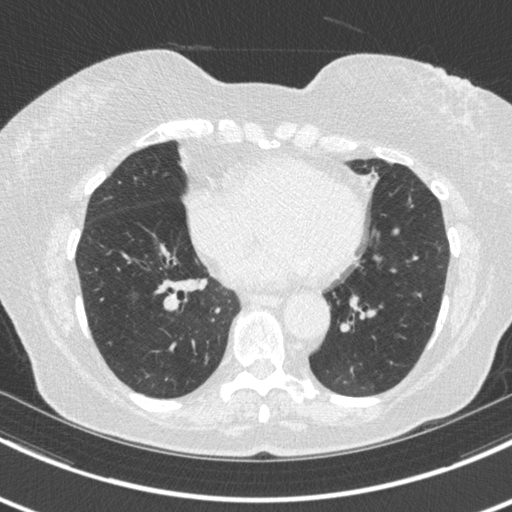
[im 192/401  lung]
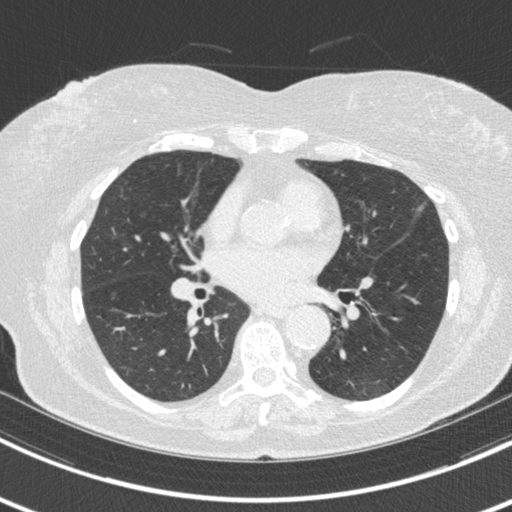
[im 209/401  lung]
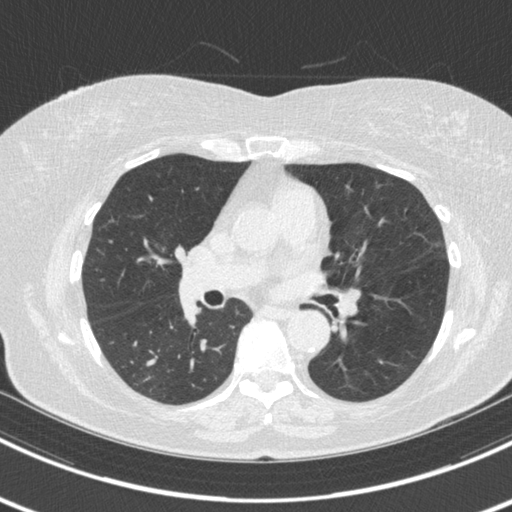
[im 244/401  lung]
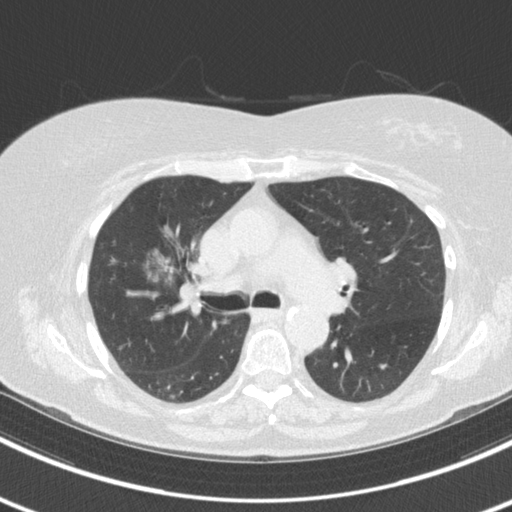
[im 279/401  mediastinal]
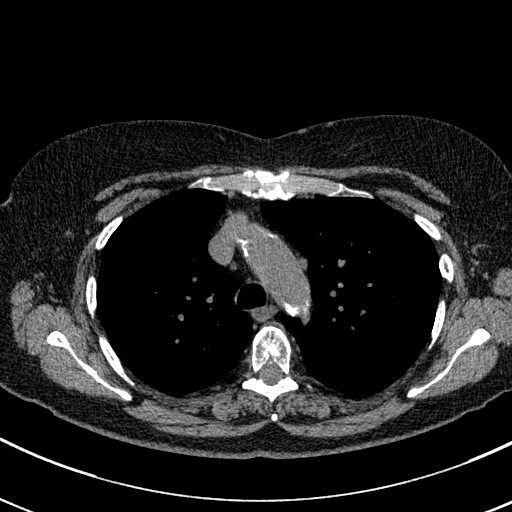
[im 279/401  lung]
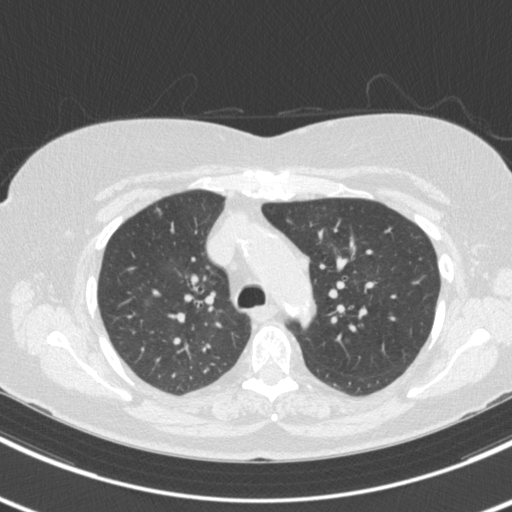
[im 314/401  lung]
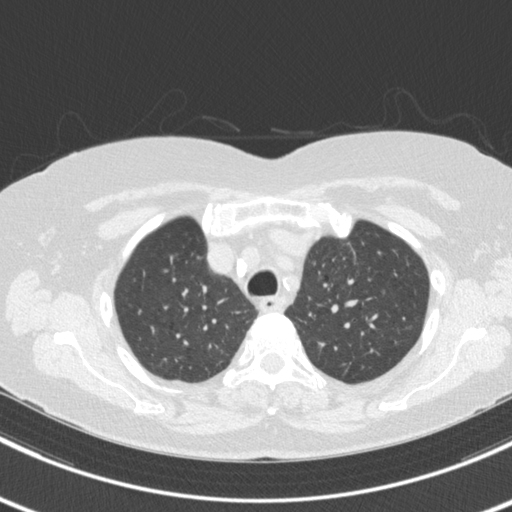
[im 348/401  lung]
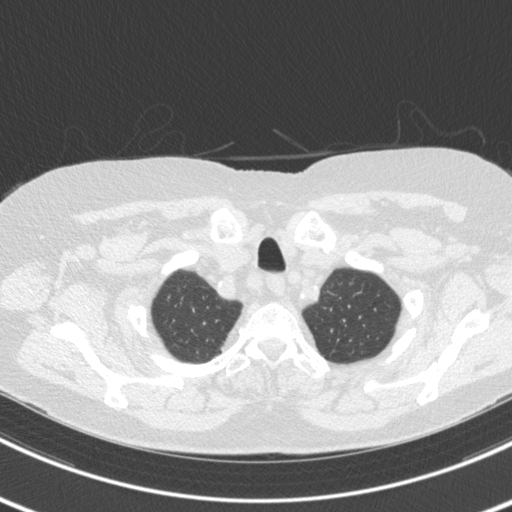
[im 383/401  lung]
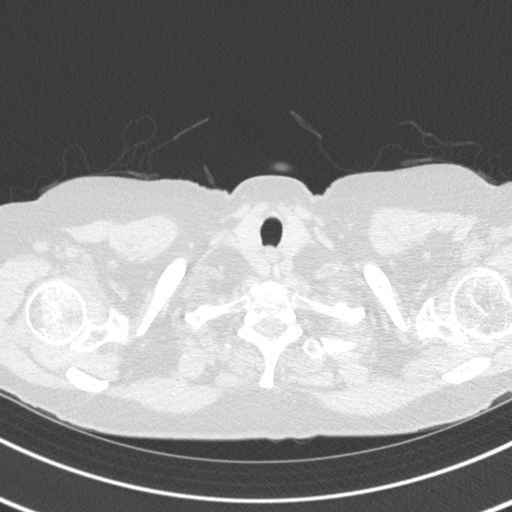

[Series 6: coronal · coronal · 0.66mm/px · 3 of 125 slices shown]
[im 25/125  lung]
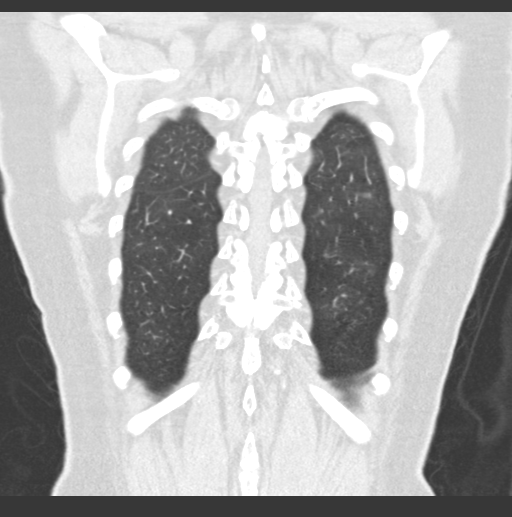
[im 50/125  lung]
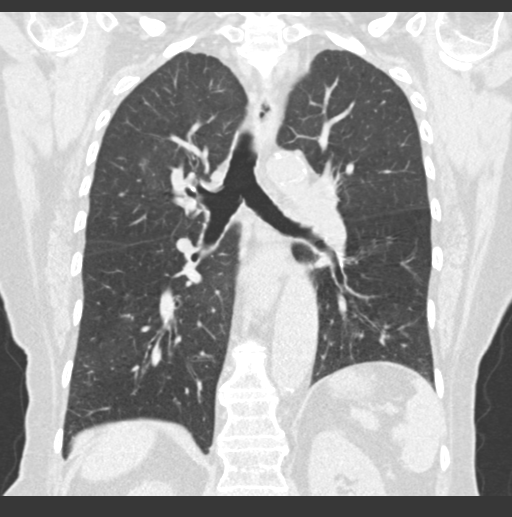
[im 75/125  lung]
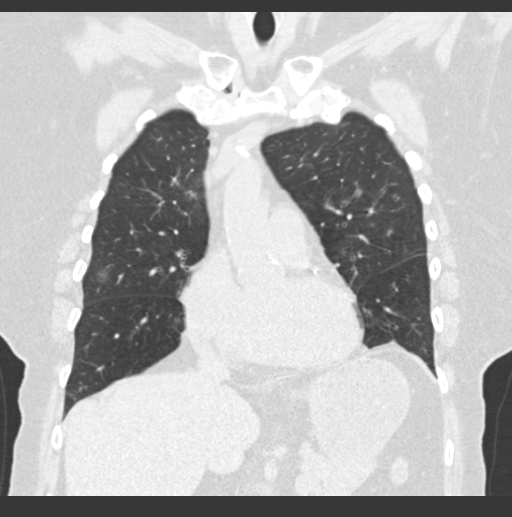

[15 of 36 positions shown; findings below may reference images not displayed]

FINDINGS: Cardiovascular: Aortic and branch vessel atherosclerosis without
aneurysmal dilation. Coronary artery calcifications. Calcifications
of the aortic valve. Normal size heart. No significant pericardial
effusion/thickening.

Mediastinum/Nodes: No suspicious thyroid nodule. No pathologically
enlarged mediastinal, hilar or axillary lymph nodes, noting limited
sensitivity for the detection of hilar adenopathy on this
noncontrast study. The esophagus is grossly unremarkable.

Lungs/Pleura: Again seen are multiple areas of bilateral nodularity,
the majority which are ground-glass attenuation. Again, the most
concerning of these is in the right upper lobe, predominantly
ground-glass in attenuation without definite central solid component
and measuring 2.6 x 1.8 cm on image [DATE] previously 2.5 x 1.5 cm.
Pulmonary nodules are stable measuring up to 9 mm in the right
middle lobe on image 36/4. No new suspicious pulmonary nodules or
masses. Chronic areas of postinfectious scarring in the medial
segment of the right middle lobe and inferior segment of the
lingula. No pleural effusion. No pneumothorax.

Upper Abdomen: No acute abnormality.

Musculoskeletal: No acute osseous abnormality.
IMPRESSION: 1. Increase in size of the ground-glass right upper lobe pulmonary
nodule which now measures 2.6 x 1.8 cm and is suspicious for slow
growing bronchogenic neoplasm, recommend further evaluation with
nuclear medicine PET-CT.

2. Additional bilateral pulmonary nodules measure up to 9 mm are
stable from prior. Attention on follow-up imaging suggested.

3.  No thoracic adenopathy.

4.  Aortic Atherosclerosis (1378M-DXI.I).

## 2024-01-10 ENCOUNTER — Telehealth: Payer: Self-pay | Admitting: Cardiology

## 2024-01-10 NOTE — Telephone Encounter (Signed)
" °*  STAT* If patient is at the pharmacy, call can be transferred to refill team.   1. Which medications need to be refilled? (please list name of each medication and dose if known)   bisoprolol  (ZEBETA ) 5 MG tablet     2. Would you like to learn more about the convenience, safety, & potential cost savings by using the [facility]? No   3. Are you open to using the [facility] (Type [facility]. No    4. Which pharmacy/location (including street and city if local pharmacy) is medication to be sent to? [facility] [address] [place], [place] - [address]    5. Do they need a 30 day or 90 day supply? 90 day   "

## 2024-01-11 MED ORDER — BISOPROLOL FUMARATE 5 MG PO TABS: 5.0000 mg | ORAL_TABLET | Freq: Every day | ORAL | 3 refills | Status: AC

## 2024-01-11 NOTE — Telephone Encounter (Signed)
 Refill complete

## 2024-02-03 ENCOUNTER — Ambulatory Visit (HOSPITAL_COMMUNITY)

## 2024-02-08 NOTE — Assessment & Plan Note (Signed)
 Unresectable stage IIb (T1c, No/N1, M0) non-small cell lung cancer, adenocarcinoma.  The patient presented with right upper lobe groundglass lesion and hypermetabolic right hilar lymph node.  She was diagnosed in September 2023. The patient underwent a course of concurrent chemoradiation with weekly carboplatin  for AUC of 2 and paclitaxel  45 Mg/M2 status post 7 cycle. She has a rough time tolerating the previous treatment with increasing fatigue and weakness. He is currently on observation and feeling fine except for the baseline shortness of breath. The patient had repeat CT scan of the chest performed recently.  I personally and independently reviewed the scan and discussed the result with the patient and her daughter.  Her scan showed no concerning findings for disease progression.    Stage IIB Non-Small Cell Lung Cancer (NSCLC) Diagnosed in September 2023 with unresectable stage IIB NSCLC, specifically adenocarcinoma. Completed seven weeks of chemotherapy and radiation, resulting in lung tissue scarring. Currently under observation with no new growths or significant changes since the last CT scan in July 2024. Reports shortness of breath and cognitive impairment. - Continue observation every six months - Perform CT scan and lab work one week before each follow-up visit - Maintain current routine until five years of stability

## 2024-02-08 NOTE — Progress Notes (Unsigned)
 " Patient Care Team: Chrystal Lamarr RAMAN, MD as PCP - General (Family Medicine) Tobb, Kardie, DO as PCP - Cardiology (Cardiology) Humberto Charmaine HERO, DMD (Dentistry) Humberto Charmaine HERO, DMD (Dentistry) Darlean Ozell NOVAK, MD as Consulting Physician (Pulmonary Disease) Dunn, Dayna N, PA-C as Physician Assistant (Cardiology)  Clinic Day:  02/08/2024  Referring physician: Chrystal Lamarr RAMAN, MD  ASSESSMENT & PLAN:   Assessment & Plan: No problem-specific Assessment & Plan notes found for this encounter.    The patient understands the plans discussed today and is in agreement with them.  She knows to contact our office if she develops concerns prior to her next appointment.  I provided *** minutes of face-to-face time during this encounter and > 50% was spent counseling as documented under my assessment and plan.    Powell FORBES Lessen, NP  [facility] - A DEPT OF [facility] [address] [place] [address] Dept: [phone] Dept Fax: [phone]   No orders of the defined types were placed in this encounter.     CHIEF COMPLAINT:  CC: Right lung cancer, stage II  Current Treatment: Observation  INTERVAL HISTORY:  Inis is here today for repeat clinical assessment.  She last saw me on 08/06/2023.  She is scheduled for surveillance CT chest with contrast on 02/14/2024.  She denies fevers or chills. She denies pain. Her appetite is good. Her weight {Weight change:10426}.  I have reviewed the past medical history, past surgical history, social history and family history with the patient and they are unchanged from previous note.  ALLERGIES:  is allergic to codeine, amoxicillin , latex, levofloxacin, statins, and wound dressing adhesive.  MEDICATIONS:  Current Outpatient Medications  Medication Sig Dispense Refill   acetaminophen  (TYLENOL ) 500 MG tablet Take 1,000 mg by mouth every 6 (six) hours as needed for  moderate pain.     albuterol  (VENTOLIN  HFA) 108 (90 Base) MCG/ACT inhaler Inhale 1-2 puffs into the lungs every 4 (four) hours as needed for wheezing or shortness of breath. 1 each 5   ascorbic acid (VITAMIN C) 500 MG tablet Take 500 mg by mouth daily.     aspirin 81 MG tablet Take 81 mg by mouth at bedtime.     azelastine (ASTELIN) 0.1 % nasal spray Place 1 spray into both nostrils as needed for rhinitis.     beta carotene w/minerals (OCUVITE) tablet Take 1 tablet by mouth at bedtime.     bisoprolol  (ZEBETA ) 5 MG tablet Take 1 tablet (5 mg total) by mouth daily. 90 tablet 3   budesonide -glycopyrrolate -formoterol  (BREZTRI  AEROSPHERE) 160-9-4.8 MCG/ACT AERO inhaler Inhale 2 puffs into the lungs in the morning and at bedtime. 10.7 g 12   cetirizine  (ZYRTEC ) 10 MG tablet Take 10 mg by mouth at bedtime.     cholecalciferol (VITAMIN D3) 25 MCG (1000 UNIT) tablet Take 1,000 Units by mouth daily.     Cyanocobalamin  (B-12) 2500 MCG TABS Take 2,500 mcg by mouth daily.     famotidine  (PEPCID ) 20 MG tablet One at bedtime 30 tablet 11   ibuprofen (ADVIL) 200 MG tablet Take 400 mg by mouth every 6 (six) hours as needed for moderate pain.     ipratropium-albuterol  (DUONEB) 0.5-2.5 (3) MG/3ML SOLN Take 3 mLs by nebulization every 4 (four) hours. Dx: J44.9 360 mL 1   Multiple Minerals-Vitamins (CAL MAG ZINC  +D3) TABS Take 1-2 tablets by mouth See admin instructions. Take 1 tablet in the  morning and 2 tablets in the evening     OXYGEN  2lpm with sleep     Respiratory Therapy Supplies (FLUTTER) DEVI 1 Device by Does not apply route as needed. 1 each 0   valACYclovir  (VALTREX ) 1000 MG tablet Take 500 mg by mouth every other day.      No current facility-administered medications for this visit.    HISTORY OF PRESENT ILLNESS:   Oncology History  Adenocarcinoma of right lung, stage 2 (HCC)  10/16/2021 Initial Diagnosis   Adenocarcinoma of right lung, stage 2 (HCC)   10/27/2021 - 12/08/2021 Chemotherapy    Patient is on Treatment Plan : LUNG Carboplatin  + Paclitaxel  + XRT q7d     07/12/2023 Imaging   CT chest with contrast  IMPRESSION: 1. Stable exam. No new or enlarging lung mass or suspicious lung nodule. 2. Multiple other nonacute observations, as described above       REVIEW OF SYSTEMS:   Constitutional: Denies fevers, chills or abnormal weight loss Eyes: Denies blurriness of vision Ears, nose, mouth, throat, and face: Denies mucositis or sore throat Respiratory: Denies cough, dyspnea or wheezes Cardiovascular: Denies palpitation, chest discomfort or lower extremity swelling Gastrointestinal:  Denies nausea, heartburn or change in bowel habits Skin: Denies abnormal skin rashes Lymphatics: Denies new lymphadenopathy or easy bruising Neurological:Denies numbness, tingling or new weaknesses Behavioral/Psych: Mood is stable, no new changes  All other systems were reviewed with the patient and are negative.   VITALS:  There were no vitals taken for this visit.  Wt Readings from Last 3 Encounters:  11/15/23 162 lb 3.2 oz (73.6 kg)  10/27/23 152 lb (68.9 kg)  08/10/23 162 lb 9.6 oz (73.8 kg)    There is no height or weight on file to calculate BMI.  Performance status (ECOG): {CHL ONC [id number]  PHYSICAL EXAM:   GENERAL:alert, no distress and comfortable SKIN: skin color, texture, turgor are normal, no rashes or significant lesions EYES: normal, Conjunctiva are pink and non-injected, sclera clear OROPHARYNX:no exudate, no erythema and lips, buccal mucosa, and tongue normal  NECK: supple, thyroid  normal size, non-tender, without nodularity LYMPH:  no palpable lymphadenopathy in the cervical, axillary or inguinal LUNGS: clear to auscultation and percussion with normal breathing effort HEART: regular rate & rhythm and no murmurs and no lower extremity edema ABDOMEN:abdomen soft, non-tender and normal bowel sounds Musculoskeletal:no cyanosis of digits and no clubbing   NEURO: alert & oriented x 3 with fluent speech, no focal motor/sensory deficits  LABORATORY DATA:  I have reviewed the data as listed    Component Value Date/Time   NA 139 07/12/2023 0953   NA 137 05/08/2020 0909   K 4.3 07/12/2023 0953   CL 100 07/12/2023 0953   CO2 35 (H) 07/12/2023 0953   GLUCOSE 122 (H) 07/12/2023 0953   BUN 13 07/12/2023 0953   BUN 8 05/08/2020 0909   CREATININE 0.50 07/12/2023 0953   CALCIUM 10.1 07/12/2023 0953   PROT 7.2 07/12/2023 0953   ALBUMIN 4.1 07/12/2023 0953   AST 18 07/12/2023 0953   ALT 18 07/12/2023 0953   ALKPHOS 86 07/12/2023 0953   BILITOT 0.4 07/12/2023 0953   GFRNONAA >60 07/12/2023 0953   GFRAA 101 11/14/2019 0000    No results found for: SPEP, UPEP  Lab Results  Component Value Date   WBC 7.9 07/12/2023   NEUTROABS 6.0 07/12/2023   HGB 10.7 (L) 07/12/2023   HCT 33.3 (L) 07/12/2023   MCV 97.7 07/12/2023   PLT 207 07/12/2023  Chemistry      Component Value Date/Time   NA 139 07/12/2023 0953   NA 137 05/08/2020 0909   K 4.3 07/12/2023 0953   CL 100 07/12/2023 0953   CO2 35 (H) 07/12/2023 0953   BUN 13 07/12/2023 0953   BUN 8 05/08/2020 0909   CREATININE 0.50 07/12/2023 0953      Component Value Date/Time   CALCIUM 10.1 07/12/2023 0953   ALKPHOS 86 07/12/2023 0953   AST 18 07/12/2023 0953   ALT 18 07/12/2023 0953   BILITOT 0.4 07/12/2023 0953       RADIOGRAPHIC STUDIES: I have personally reviewed the radiological images as listed and agreed with the findings in the report. No results found. "

## 2024-02-10 ENCOUNTER — Ambulatory Visit

## 2024-02-10 ENCOUNTER — Inpatient Hospital Stay

## 2024-02-10 ENCOUNTER — Inpatient Hospital Stay: Admitting: Nurse Practitioner

## 2024-02-14 ENCOUNTER — Ambulatory Visit (HOSPITAL_COMMUNITY)

## 2024-02-22 ENCOUNTER — Ambulatory Visit (HOSPITAL_COMMUNITY)

## 2024-02-23 ENCOUNTER — Inpatient Hospital Stay: Admitting: Nurse Practitioner

## 2024-02-23 ENCOUNTER — Inpatient Hospital Stay

## 2024-03-20 ENCOUNTER — Ambulatory Visit

## 2024-04-13 ENCOUNTER — Ambulatory Visit: Admitting: Emergency Medicine
